# Patient Record
Sex: Female | Born: 1945 | Race: White | Hispanic: No | Marital: Married | Smoking: Never smoker
Health system: Southern US, Community
[De-identification: ages and names within clinical notes are randomized; demographics above are authoritative.]

## PROBLEM LIST (undated history)

## (undated) DIAGNOSIS — I10 Essential (primary) hypertension: Secondary | ICD-10-CM

## (undated) DIAGNOSIS — J45909 Unspecified asthma, uncomplicated: Secondary | ICD-10-CM

## (undated) DIAGNOSIS — B029 Zoster without complications: Secondary | ICD-10-CM

## (undated) DIAGNOSIS — K529 Noninfective gastroenteritis and colitis, unspecified: Secondary | ICD-10-CM

## (undated) DIAGNOSIS — Z87442 Personal history of urinary calculi: Secondary | ICD-10-CM

## (undated) DIAGNOSIS — K56609 Unspecified intestinal obstruction, unspecified as to partial versus complete obstruction: Secondary | ICD-10-CM

## (undated) DIAGNOSIS — M549 Dorsalgia, unspecified: Secondary | ICD-10-CM

## (undated) DIAGNOSIS — I447 Left bundle-branch block, unspecified: Secondary | ICD-10-CM

## (undated) DIAGNOSIS — A0472 Enterocolitis due to Clostridium difficile, not specified as recurrent: Secondary | ICD-10-CM

## (undated) DIAGNOSIS — J101 Influenza due to other identified influenza virus with other respiratory manifestations: Secondary | ICD-10-CM

## (undated) DIAGNOSIS — I639 Cerebral infarction, unspecified: Secondary | ICD-10-CM

## (undated) DIAGNOSIS — K219 Gastro-esophageal reflux disease without esophagitis: Secondary | ICD-10-CM

## (undated) DIAGNOSIS — M1990 Unspecified osteoarthritis, unspecified site: Secondary | ICD-10-CM

## (undated) DIAGNOSIS — I341 Nonrheumatic mitral (valve) prolapse: Secondary | ICD-10-CM

## (undated) HISTORY — PX: ABDOMINAL HYSTERECTOMY: SHX81

## (undated) HISTORY — PX: JOINT REPLACEMENT: SHX530

## (undated) HISTORY — PX: TONSILLECTOMY: SUR1361

## (undated) HISTORY — PX: CHOLECYSTECTOMY: SHX55

## (undated) HISTORY — PX: APPENDECTOMY: SHX54

## (undated) HISTORY — PX: CATARACT EXTRACTION: SUR2

## (undated) HISTORY — PX: OTHER SURGICAL HISTORY: SHX169

## (undated) HISTORY — PX: EXPLORATORY LAPAROTOMY: SUR591

## (undated) HISTORY — DX: Cerebral infarction, unspecified: I63.9

---

## 2008-03-16 HISTORY — PX: COLONOSCOPY: SHX174

## 2009-03-16 DIAGNOSIS — A0472 Enterocolitis due to Clostridium difficile, not specified as recurrent: Secondary | ICD-10-CM

## 2009-03-16 HISTORY — DX: Enterocolitis due to Clostridium difficile, not specified as recurrent: A04.72

## 2009-03-16 HISTORY — PX: ESOPHAGOGASTRODUODENOSCOPY: SHX1529

## 2009-12-07 ENCOUNTER — Inpatient Hospital Stay (HOSPITAL_COMMUNITY)
Admission: EM | Admit: 2009-12-07 | Discharge: 2009-12-15 | Payer: Self-pay | Source: Home / Self Care | Admitting: Emergency Medicine

## 2009-12-09 ENCOUNTER — Ambulatory Visit: Payer: Self-pay | Admitting: Internal Medicine

## 2009-12-11 ENCOUNTER — Ambulatory Visit: Payer: Self-pay | Admitting: Internal Medicine

## 2009-12-14 ENCOUNTER — Ambulatory Visit: Payer: Self-pay | Admitting: Internal Medicine

## 2010-01-07 ENCOUNTER — Ambulatory Visit: Payer: Self-pay | Admitting: Internal Medicine

## 2010-01-07 ENCOUNTER — Encounter: Payer: Self-pay | Admitting: Urgent Care

## 2010-01-07 DIAGNOSIS — R197 Diarrhea, unspecified: Secondary | ICD-10-CM | POA: Insufficient documentation

## 2010-01-07 DIAGNOSIS — Z8719 Personal history of other diseases of the digestive system: Secondary | ICD-10-CM

## 2010-01-07 DIAGNOSIS — R109 Unspecified abdominal pain: Secondary | ICD-10-CM

## 2010-01-07 DIAGNOSIS — D649 Anemia, unspecified: Secondary | ICD-10-CM

## 2010-01-09 LAB — CONVERTED CEMR LAB
Albumin: 4.4 g/dL (ref 3.5–5.2)
Alkaline Phosphatase: 65 units/L (ref 39–117)
CO2: 25 meq/L (ref 19–32)
Chloride: 105 meq/L (ref 96–112)
Eosinophils Absolute: 0.4 10*3/uL (ref 0.0–0.7)
Glucose, Bld: 91 mg/dL (ref 70–99)
Lymphocytes Relative: 39 % (ref 12–46)
Lymphs Abs: 2 10*3/uL (ref 0.7–4.0)
Neutro Abs: 2.3 10*3/uL (ref 1.7–7.7)
Neutrophils Relative %: 44 % (ref 43–77)
Platelets: 387 10*3/uL (ref 150–400)
Potassium: 4.9 meq/L (ref 3.5–5.3)
Sodium: 139 meq/L (ref 135–145)
Total Protein: 6.7 g/dL (ref 6.0–8.3)
WBC: 5.1 10*3/uL (ref 4.0–10.5)

## 2010-02-04 ENCOUNTER — Ambulatory Visit: Payer: Self-pay | Admitting: Internal Medicine

## 2010-02-04 ENCOUNTER — Encounter (INDEPENDENT_AMBULATORY_CARE_PROVIDER_SITE_OTHER): Payer: Self-pay | Admitting: *Deleted

## 2010-02-11 DIAGNOSIS — K219 Gastro-esophageal reflux disease without esophagitis: Secondary | ICD-10-CM | POA: Insufficient documentation

## 2010-02-11 DIAGNOSIS — R1319 Other dysphagia: Secondary | ICD-10-CM

## 2010-02-24 ENCOUNTER — Ambulatory Visit (HOSPITAL_COMMUNITY)
Admission: RE | Admit: 2010-02-24 | Discharge: 2010-02-24 | Payer: Self-pay | Source: Home / Self Care | Attending: Internal Medicine | Admitting: Internal Medicine

## 2010-03-06 ENCOUNTER — Encounter: Payer: Self-pay | Admitting: Internal Medicine

## 2010-04-15 NOTE — Assessment & Plan Note (Signed)
Summary: F/U PAIN IN HER STOMACH/HX OF C-DIFF/LAW   Visit Type:  Follow-up Visit Primary Care Provider:  michael caplan  Chief Complaint:  follow up- still having abd pain.  History of Present Illness: 65 year old lady recently hospitalized  with C. difficile infection. Now much better on probiotic therapy. She returns for followup ;she did have some nagging periumbilical pain predating her hospitalization .she continues to have symptoms but they have settled down  considerably. In reviewing her GI system, she notes long-standing GERD and esophageal dysphagia to solid food.  In  fact, she describes frequent intermittent food impactions. She describes undergoing an EGD in [place], [place]  some 20 years ago without significant findings. She did see Dr. Kingsley Plan hour in [place] a few years back.  NO EGD at that time or UGI series.. However, she was sent for, what sounds like an esophageal manometry,  and the catheter could not be placed.  This lady is intolerant to basically all the proton pump inhibitors and has been on ranitidine on a p.r.n. basis. She was noted to have an elevated eosinophil count in the 8 range recently. She tells me that her eosinophil count has been elevated most of her life which is very interesting. She denies melena hematochezia constipation diarrhea at this time. No family history of colon polyps or colon cancer. She had a negative colonoscopy 1 years ag by Dr Samuella Cota.  With the  information I have at hand at this time,  she have another colonoscopy in 9 years.  Preventive Screening-Counseling & Management  Alcohol-Tobacco     Smoking Status: never  Current Problems (verified): 1)  Anemia  (ICD-285.9) 2)  Abdominal Pain  (ICD-789.00) 3)  Diarrhea  (ICD-787.91) 4)  Clostridium Difficile Colitis, Hx of  (ICD-V12.79)  Current Medications (verified): 1)  Ranitidine Hcl 150 Mg Caps (Ranitidine Hcl) .... Take 1 Tablet By Mouth Two Times A Day 2)  Nitrostat 0.4 Mg Subl  (Nitroglycerin) .... As Needed 3)  Zyrtec Hives Relief 10 Mg Tabs (Cetirizine Hcl) .... Take 1 Tablet By Mouth Once A Day 4)  Centrum Silver .... Take 1 Tablet By Mouth Once A Day 5)  Nasonex 50 Mcg/act Susp (Mometasone Furoate) .... As Directed 6)  Singulair 10 Mg Tabs (Montelukast Sodium) .... Take 1 Tablet By Mouth Once A Day 7)  Epi Pen .... As Needed 8)  Cozaar 100 Mg Tabs (Losartan Potassium) .... Take 1 Tablet By Mouth Once A Day 9)  Aldactone 25 Mg Tabs (Spironolactone) .... Take 1 Tablet By Mouth Once A Day 10)  Lipitor 10 Mg Tabs (Atorvastatin Calcium) .... Take 1 Tablet By Mouth Once A Day 11)  Calan Sr 240 Mg Cr-Tabs (Verapamil Hcl) .... Take 1 Tablet By Mouth Once A Day 12)  Allergy Injections .... Once Every 3 Weeks 13)  Florastor 250 Mg Caps (Saccharomyces Boulardii) .Marland Kitchen.. 1 By Mouth Two Times A Day  Allergies (verified): 1)  ! Prilosec 2)  ! Nexium 3)  ! * Aciphex 4)  ! * Protonix 5)  ! Sulfa  Past History:  Past Medical History: mvp htn  high cholesterol allergies  Past Surgical History: hyst gallbladder appendix vericose veins tonsils tubal ligation breast lumps mole on L arm  Family History: Father: deceased- heart problems, ? pancreatic cancer Mother: deceased- breast cancer Siblings: 1 brother deceased No FH of Colon Cancer:  Social History: Marital Status: Married Children: 3 Occupation: semi retired Patient has never smoked.  Alcohol Use - no Smoking Status:  never  Vital Signs:  Patient profile:   65 year old female Height:      66.5 inches Weight:      160 pounds BMI:     25.53 Temp:     98.0 degrees F oral Pulse rate:   80 / minute BP sitting:   138 / 84  (left arm) Cuff size:   regular  Vitals Entered By: Hendricks Limes LPN (February 04, 2010 2:51 PM)  Physical Exam  General:  very pleasant well groomed lady in no acute distress Eyes:  no scleral icterus Abdomen:  nondistended positive bowel sounds she does have some  epigastric/periumbilical tenderness to palpation no appreciable mass or organomegaly  Impression & Recommendations:  Problem # 1:  ANEMIA (ICD-285.9) Impression:very pleasant 65 year old lady with a history of Clostridium difficile-related colitis and diarrhea. Symptoms now resolved.  She does have a history of elevated eosinophil count currently has an elevated eosinophil count. She describes long-standing GERD and esophageal dysphagia with transient food impactions. The symptoms need further investigation.  Recommendations:EGD with dilation plus minus biopsy et Karie Soda as appropriate in the near future risks, benefits, limitations imponderables alternatives have been discussed. Questions were answered all parties  agreeable. We'll make further recommendations at the very near future.  Appended Document: Orders Update    Clinical Lists Changes  Problems: Added new problem of OTHER DYSPHAGIA (ICD-787.29) Added new problem of GERD (ICD-530.81) Orders: Added new Service order of Est. Patient Level IV ([address]109) - Signed

## 2010-04-15 NOTE — Assessment & Plan Note (Signed)
Summary: hos fu in 3 weeks per RMR/ss   Visit Type:  Follow-up Visit Primary Care Provider:  Lavell Luster  Chief Complaint:  F/U hospital.  History of Present Illness: c/o abd pain around umbilicus, tender to touch x 10 days ago.  Worse w/ diarrhea 5-8 times after eating certain meals.  Denies mucous or blood in stool.  On average 2-3 BMs/day.  Overall feels well, except fatigue.  Hx constipation previously.  Rare nausea. Denies vomiting.  Denies fever.  Wt loss steady 20# IN 65YR w/ weight watchers.  Last colonoscopy last yr Dr Madelin Headings in [place], pt questions whether she needs repeat colonoscopy by Dr Jena Gauss?  Current Problems (verified): 1)  Abdominal Pain  (ICD-789.00) 2)  Diarrhea  (ICD-787.91) 3)  Clostridium Difficile Colitis, Hx of  (ICD-V12.79)  Current Medications (verified): 1)  Ranitidine Hcl 150 Mg Caps (Ranitidine Hcl) .... Take 1 Tablet By Mouth Two Times A Day 2)  Nitrostat 0.4 Mg Subl (Nitroglycerin) .... As Needed 3)  Zyrtec Hives Relief 10 Mg Tabs (Cetirizine Hcl) .... Take 1 Tablet By Mouth Once A Day 4)  Centrum Silver .... Take 1 Tablet By Mouth Once A Day 5)  Nasonex 50 Mcg/act Susp (Mometasone Furoate) .... As Directed 6)  Singulair 10 Mg Tabs (Montelukast Sodium) .... Take 1 Tablet By Mouth Once A Day 7)  Epi Pen .... As Needed 8)  Cozaar 100 Mg Tabs (Losartan Potassium) .... Take 1 Tablet By Mouth Once A Day 9)  Aldactone 25 Mg Tabs (Spironolactone) .... Take 1 Tablet By Mouth Once A Day 10)  Lipitor 10 Mg Tabs (Atorvastatin Calcium) .... Take 1 Tablet By Mouth Once A Day 11)  Calan Sr 240 Mg Cr-Tabs (Verapamil Hcl) .... Take 1 Tablet By Mouth Once A Day 12)  Allergy Injections .... Once Every 3 Weeks 13)  Florastor 250 Mg Caps (Saccharomyces Boulardii) .Marland Kitchen.. 1 By Mouth Two Times A Day  Allergies (verified): 1)  ! Prilosec 2)  ! Nexium 3)  ! * Aciphex 4)  ! * Protonix 5)  ! Sulfa  Review of Systems General:  Complains of weakness and malaise; denies fever,  chills, sweats, anorexia, fatigue, weight loss, and sleep disorder. Derm:  Denies rash, itching, dry skin, hives, moles, warts, and unhealing ulcers. Psych:  Denies depression, anxiety, memory loss, suicidal ideation, hallucinations, paranoia, phobia, and confusion. Heme:  Denies bruising, bleeding, and enlarged lymph nodes.  Vital Signs:  Patient profile:   65 year old female Height:      65.5 inches Weight:      169 pounds BMI:     27.80 Temp:     97.8 degrees F oral Pulse rate:   76 / minute BP sitting:   140 / 72  (right arm)  Physical Exam  General:  Well developed, well nourished, no acute distress. Head:  Normocephalic and atraumatic. Eyes:  Sclera clear, no icterus. Ears:  Normal auditory acuity. Nose:  No deformity, discharge,  or lesions. Mouth:  No deformity or lesions, dentition normal. Neck:  Supple; no masses or thyromegaly. Heart:  Regular rate and rhythm; no murmurs, rubs,  or bruits. Abdomen:  Soft, mild umbilical tenderness and nondistended. No masses, hepatosplenomegaly or hernias noted. Normal bowel sounds.without guarding and without rebound.   Msk:  Symmetrical with no gross deformities. Normal posture. Pulses:  Normal pulses noted. Extremities:  No clubbing, cyanosis, edema or deformities noted. Neurologic:  Alert and  oriented x4;  grossly normal neurologically. Skin:  Intact without significant  lesions or rashes. Cervical Nodes:  No significant cervical adenopathy. Psych:  Alert and cooperative. Normal mood and affect.  Impression & Recommendations:  Problem # 1:  CLOSTRIDIUM DIFFICILE COLITIS, HX OF (ICD-V12.79) 65 y/o caucasian female recently hospitalized w/ c diff colitis after abx use.  Completed course of vancomycin.  Doing well except 2 bouts post-prandial diarrhea and fatigue.  ?post-infectious IBS.  Discussed w/ Dr Jena Gauss, will continue to monitor.  Will not pursue repeat colonoscopy at this time.  Remain on probiotics. Orders: T-CBC w/Diff  ([id number]) T-Comprehensive Metabolic Panel ([id number]) Est. Patient Level III (29562)  Problem # 2:  ABDOMINAL PAIN (ICD-789.00) Mild tenderness around umbilicus, suspect gradual improvement over time sec #1  Problem # 3:  DIARRHEA (ICD-787.91) See #1  Problem # 4:  ANEMIA (ICD-285.9) While hospitalized.  FU CBC.  Patient Instructions: 1)  Continue Florastor two times a day Please fax records to Dr Lavell Luster [phone] Phone#[phone]  Appended Document: hos fu in 3 weeks per RMR/ss FU RMR 6-8 weeks  Appended Document: hos fu in 3 weeks per RMR/ss left message with pt's husband to have pt call me back to set up OV for 6-8 weeks w/RMR. I was told pt was napping and she would call me back.

## 2010-04-15 NOTE — Miscellaneous (Signed)
Summary: CONSULTATION  Clinical Lists Changes  NAME:  Rhonda Murphy, Rhonda Murphy               ACCOUNT NO.:  [id number]      MEDICAL RECORD NO.:  [id number]          PATIENT TYPE:  INP      LOCATION:  A303                          FACILITY:  [facility]      PHYSICIAN:  Lorenza Burton, N.P.    DATE OF BIRTH:  January 15, 1946      DATE OF CONSULTATION:  12/09/2009   DATE OF DISCHARGE:                                    CONSULTATION         PRIMARY CARE PROVIDER:  Primary care provider is in [place], [place].      CHIEF COMPLAINT:  Nausea, vomiting, and diarrhea.      HISTORY OF PRESENT ILLNESS:  Rhonda Murphy is a 65 year old female who is   very pleasant and who actually presented with severe diarrhea, up to 10-   12 stools per day that were watery.  She had actually finished about a   10-day course of antibiotics about a month ago due to an upper   respiratory infection.  She states that she finished up the antibiotics   on a Sunday and then she proceeded to have diffuse diarrhea starting on   that Thursday.  She then came to the hospital for treatment.  Upon   speaking with the patient, she complains of abdominal pain in the right   lower quadrant.  She is tender to palpation.  She does complain of   nausea.  She has had little to no appetite due to this nausea.   Yesterday, she had 12 watery stools; she has had 3 since 5:30 this   morning.  She denies any history of C. difficile, colon cancer, liver   problems, or digestive problems.  Her last colonoscopy was actually done   last year.      PAST MEDICAL HISTORY:   1. Chronic back pain.   2. History of gastric ulcer.   3. History of mitral valve prolapse.   4. Allergic rhinitis.   5. History of prior angioedema.  She has an EpiPen.   6. Hypertension.   7. Hyperlipidemia.      PAST SURGICAL HISTORY:   1. Tonsillectomy.   2. Right breast lumpectomy.   3. Mole removal.   4. Tubal ligation.   5. Partial hysterectomy.   6. Cholecystectomy.   7.  Appendectomy.   8. She has also had some type of a calcium deposit removed from her       left breast.      ALLERGIES:  ALL PROTON PUMP INHIBITORS AND SULFA.      MEDICATIONS:  Current medications for this admission are:   1. Lovenox 40 mg subcutaneously daily.   2. Pepcid 20 mg p.o. twice a day.   3. Flagyl 500 mg IV every 8 hours.   4. Zofran 4 mg IV every 6 hours.   5. Florastor 500 mg p.o. twice daily.   6. Pepto-Bismol 30 mL p.o. every 4 hours as needed.   7. Morphine 2-4 mg IV every 3 hours as needed  for pain.   8. Phenergan 6.25-12.5 mg IV every 4 hours as needed for nausea.   9. Ambien 5 mg p.o. each evening.      FAMILY/SOCIAL HISTORY:  She is married and lives with her husband in   [place], [place].  She denies any tobacco, alcohol, or IV drug abuse.   She denies any family history of colon cancer, liver problems, or   digestive problems.      REVIEW OF SYSTEMS:  Review of systems is negative except as mentioned in   the history of present illness.      PHYSICAL EXAMINATION:  VITAL SIGNS:  BP 116/73, pulse 104, respirations   18, temperature 98.8.  She is 96% on room air.   GENERAL:  She is in no apparent distress.   HEENT:  Sclerae are nonicteric.   RESPIRATORY:  Clear to auscultation bilaterally.   CARDIAC:  Regular rate and rhythm.   ABDOMEN:  Soft.  It is tender in the mid-abdominal area as well as the   right lower quadrant.  It is nondistended.  She has positive bowel   sounds.   EXTREMITIES:  Without edema.      PERTINENT LABS:  As of this morning, white blood cell count 25.6,   hemoglobin 11.6, hematocrit 34.2.  Sodium 133, potassium 4.0, chloride   107, CO2 of 21, BUN 13, creatinine 1.24, calcium 7.8.  C. difficile   stool samples are pending.  CT scan of the abdomen showed diffuse   colitis involving the entirety of the colon and cecum with significant   wall thickening and soft tissue formation, most likely reflecting an   infectious process such as C.  difficile, less likely or inflammatory   process.      ASSESSMENT:  Rhonda Murphy is a 65 year old female who has presented with   abdominal pain and diarrhea after taking antibiotics for about a 10-day   course.  The likely diagnosis is Clostridium difficile.  We are awaiting   the cultures.      PLAN:   1. Continue Flagyl IV and convert to p.o. when she is able to tolerate       p.o.   2. Discontinue the aspirin as she is continuing to have nausea.   3. As she is allergic to PPI, we will start her on Pepcid 20 mg twice       a day for GI prophylaxis.   4. Add Florastor twice a day.   5. Kaopectate as needed.   6. Advance to a full diet when she is able to tolerate.   7. We will need the report from her last colonoscopy which was       approximately a year ago in [place]. by Dr Madelin Headings.      We would like to thank Dr. Mikeal Hawthorne for this consult.               Lorenza Burton, N.P.               KJ/MEDQ  D:  12/09/2009  T:  12/09/2009  Job:  [id number]      cc:   Lonia Blood, M.D.      Dr. Filomena Jungling   [place]   [place]      Electronically Signed by Lorenza Burton N.P. on 12/10/2009 11:40:40 AM   Electronically Signed by Lorrin Goodell M.D. on 01/20/2010 11:17:03 AM

## 2010-04-17 NOTE — Letter (Signed)
Summary: Patient Notice, Endo Biopsy Results  [facility]  [address]   [place], [place] [address]   Phone: [phone]  Fax: [phone]       March 06, 2010   Rhonda Murphy [address] [place], [place]  [address] 11/02/1945    Dear Ms. Robinette,  I am pleased to inform you that the biopsies taken during your recent endoscopic examination did not show any abnormality upon pathologic examination.  Additional information/recommendations:  Continue with the treatment plan as outlined on the day of your exam.  Please call us if you are having persistent problems or have questions about your condition that have not been fully answered at this time.  Sincerely,    R. Roetta Sessions MD, FACP [facility] Ph: [phone]   Fax: [phone]   Appended Document: Patient Notice, Endo Biopsy Results letter mailed to pt

## 2010-05-29 LAB — URINALYSIS, ROUTINE W REFLEX MICROSCOPIC
Nitrite: NEGATIVE
Specific Gravity, Urine: >1.03 — ABNORMAL HIGH (ref 1.005–1.030)
pH: 5.5 (ref 5.0–8.0)

## 2010-05-29 LAB — GLUCOSE, CAPILLARY
Glucose-Capillary: 114 mg/dL — ABNORMAL HIGH (ref 70–99)
Glucose-Capillary: 252 mg/dL — ABNORMAL HIGH (ref 70–99)
Glucose-Capillary: 269 mg/dL — ABNORMAL HIGH (ref 70–99)

## 2010-05-29 LAB — CBC
HCT: 29.7 % — ABNORMAL LOW (ref 36.0–46.0)
HCT: 30.6 % — ABNORMAL LOW (ref 36.0–46.0)
HCT: 32.4 % — ABNORMAL LOW (ref 36.0–46.0)
HCT: 34.2 % — ABNORMAL LOW (ref 36.0–46.0)
Hemoglobin: 9.9 g/dL — ABNORMAL LOW (ref 12.0–15.0)
Hemoglobin: 10.2 g/dL — ABNORMAL LOW (ref 12.0–15.0)
Hemoglobin: 10.9 g/dL — ABNORMAL LOW (ref 12.0–15.0)
Hemoglobin: 11.3 g/dL — ABNORMAL LOW (ref 12.0–15.0)
Hemoglobin: 11.3 g/dL — ABNORMAL LOW (ref 12.0–15.0)
Hemoglobin: 11.6 g/dL — ABNORMAL LOW (ref 12.0–15.0)
MCH: 30.6 pg (ref 26.0–34.0)
MCH: 30.4 pg (ref 26.0–34.0)
MCH: 30.6 pg (ref 26.0–34.0)
MCH: 30.6 pg (ref 26.0–34.0)
MCH: 30.8 pg (ref 26.0–34.0)
MCH: 30.9 pg (ref 26.0–34.0)
MCH: 30.9 pg (ref 26.0–34.0)
MCHC: 33.3 g/dL (ref 30.0–36.0)
MCHC: 33.4 g/dL (ref 30.0–36.0)
MCHC: 33.7 g/dL (ref 30.0–36.0)
MCHC: 33.7 g/dL (ref 30.0–36.0)
MCHC: 33.8 g/dL (ref 30.0–36.0)
MCHC: 33.9 g/dL (ref 30.0–36.0)
MCHC: 34 g/dL (ref 30.0–36.0)
MCV: 90.6 fL (ref 78.0–100.0)
MCV: 90.9 fL (ref 78.0–100.0)
MCV: 91.1 fL (ref 78.0–100.0)
MCV: 91.6 fL (ref 78.0–100.0)
Platelets: 331 10*3/uL (ref 150–400)
Platelets: 345 10*3/uL (ref 150–400)
Platelets: 367 10*3/uL (ref 150–400)
Platelets: 403 10*3/uL — ABNORMAL HIGH (ref 150–400)
Platelets: 416 K/uL — ABNORMAL HIGH (ref 150–400)
RBC: 3.36 MIL/uL — ABNORMAL LOW (ref 3.87–5.11)
RBC: 3.52 MIL/uL — ABNORMAL LOW (ref 3.87–5.11)
RDW: 12.1 % (ref 11.5–15.5)
RDW: 12.4 % (ref 11.5–15.5)
RDW: 12.5 % (ref 11.5–15.5)
WBC: 12.9 K/uL — ABNORMAL HIGH (ref 4.0–10.5)
WBC: 21.8 10*3/uL — ABNORMAL HIGH (ref 4.0–10.5)

## 2010-05-29 LAB — BASIC METABOLIC PANEL
CO2: 27 mEq/L (ref 19–32)
CO2: 18 mEq/L — ABNORMAL LOW (ref 19–32)
CO2: 21 mEq/L (ref 19–32)
CO2: 21 mEq/L (ref 19–32)
CO2: 23 mEq/L (ref 19–32)
Calcium: 7.4 mg/dL — ABNORMAL LOW (ref 8.4–10.5)
Calcium: 7.4 mg/dL — ABNORMAL LOW (ref 8.4–10.5)
Calcium: 7.6 mg/dL — ABNORMAL LOW (ref 8.4–10.5)
Calcium: 7.7 mg/dL — ABNORMAL LOW (ref 8.4–10.5)
Chloride: 106 mEq/L (ref 96–112)
Chloride: 108 mEq/L (ref 96–112)
Creatinine, Ser: 0.94 mg/dL (ref 0.4–1.2)
Creatinine, Ser: 0.95 mg/dL (ref 0.4–1.2)
GFR calc non Af Amer: 59 mL/min — ABNORMAL LOW (ref >60)
Glucose, Bld: 101 mg/dL — ABNORMAL HIGH (ref 70–99)
Glucose, Bld: 106 mg/dL — ABNORMAL HIGH (ref 70–99)
Glucose, Bld: 109 mg/dL — ABNORMAL HIGH (ref 70–99)
Glucose, Bld: 111 mg/dL — ABNORMAL HIGH (ref 70–99)
Glucose, Bld: 118 mg/dL — ABNORMAL HIGH (ref 70–99)
Potassium: 4 mEq/L (ref 3.5–5.1)
Potassium: 4 mEq/L (ref 3.5–5.1)
Sodium: 135 mEq/L (ref 135–145)
Sodium: 132 mEq/L — ABNORMAL LOW (ref 135–145)
Sodium: 133 mEq/L — ABNORMAL LOW (ref 135–145)
Sodium: 135 mEq/L (ref 135–145)

## 2010-05-29 LAB — DIFFERENTIAL
Basophils Absolute: 0 10*3/uL (ref 0.0–0.1)
Basophils Absolute: 0.1 10*3/uL (ref 0.0–0.1)
Basophils Absolute: 0.1 K/uL (ref 0.0–0.1)
Basophils Relative: 1 % (ref 0–1)
Basophils Relative: 0 % (ref 0–1)
Basophils Relative: 0 % (ref 0–1)
Basophils Relative: 0 % (ref 0–1)
Basophils Relative: 0 % (ref 0–1)
Basophils Relative: 1 % (ref 0–1)
Eosinophils Absolute: 0.6 10*3/uL (ref 0.0–0.7)
Eosinophils Absolute: 0 10*3/uL (ref 0.0–0.7)
Eosinophils Absolute: 0.1 10*3/uL (ref 0.0–0.7)
Eosinophils Absolute: 0.1 10*3/uL (ref 0.0–0.7)
Eosinophils Absolute: 0.3 K/uL (ref 0.0–0.7)
Eosinophils Relative: 6 % — ABNORMAL HIGH (ref 0–5)
Eosinophils Relative: 0 % (ref 0–5)
Eosinophils Relative: 0 % (ref 0–5)
Eosinophils Relative: 1 % (ref 0–5)
Eosinophils Relative: 1 % (ref 0–5)
Eosinophils Relative: 2 % (ref 0–5)
Eosinophils Relative: 2 % (ref 0–5)
Lymphocytes Relative: 11 % — ABNORMAL LOW (ref 12–46)
Lymphocytes Relative: 13 % (ref 12–46)
Lymphocytes Relative: 5 % — ABNORMAL LOW (ref 12–46)
Lymphs Abs: 1.2 10*3/uL (ref 0.7–4.0)
Lymphs Abs: 1.3 10*3/uL (ref 0.7–4.0)
Lymphs Abs: 1.4 10*3/uL (ref 0.7–4.0)
Lymphs Abs: 2.1 K/uL (ref 0.7–4.0)
Lymphs Abs: 2.2 10*3/uL (ref 0.7–4.0)
Monocytes Absolute: 0.8 10*3/uL (ref 0.1–1.0)
Monocytes Absolute: 1.1 10*3/uL — ABNORMAL HIGH (ref 0.1–1.0)
Monocytes Absolute: 1.1 K/uL — ABNORMAL HIGH (ref 0.1–1.0)
Monocytes Absolute: 1.4 10*3/uL — ABNORMAL HIGH (ref 0.1–1.0)
Monocytes Absolute: 1.4 10*3/uL — ABNORMAL HIGH (ref 0.1–1.0)
Monocytes Absolute: 1.5 10*3/uL — ABNORMAL HIGH (ref 0.1–1.0)
Monocytes Relative: 8 % (ref 3–12)
Monocytes Relative: 6 % (ref 3–12)
Monocytes Relative: 6 % (ref 3–12)
Monocytes Relative: 6 % (ref 3–12)
Monocytes Relative: 6 % (ref 3–12)
Monocytes Relative: 7 % (ref 3–12)
Neutro Abs: 10.5 10*3/uL — ABNORMAL HIGH (ref 1.7–7.7)
Neutro Abs: 13.2 K/uL — ABNORMAL HIGH (ref 1.7–7.7)
Neutro Abs: 21 10*3/uL — ABNORMAL HIGH (ref 1.7–7.7)
Neutro Abs: 22.2 10*3/uL — ABNORMAL HIGH (ref 1.7–7.7)
Neutrophils Relative %: 78 % — ABNORMAL HIGH (ref 43–77)
Neutrophils Relative %: 80 % — ABNORMAL HIGH (ref 43–77)
Neutrophils Relative %: 81 % — ABNORMAL HIGH (ref 43–77)
Neutrophils Relative %: 87 % — ABNORMAL HIGH (ref 43–77)

## 2010-05-29 LAB — CULTURE, BLOOD (ROUTINE X 2)
Culture: NO GROWTH
Culture: NO GROWTH

## 2010-05-29 LAB — BRAIN NATRIURETIC PEPTIDE: Pro B Natriuretic peptide (BNP): <30 pg/mL (ref 0.0–100.0)

## 2010-05-29 LAB — OVA AND PARASITE EXAMINATION

## 2010-05-29 LAB — COMPREHENSIVE METABOLIC PANEL
ALT: 16 U/L (ref 0–35)
AST: 22 U/L (ref 0–37)
AST: 25 U/L (ref 0–37)
Albumin: 2.5 g/dL — ABNORMAL LOW (ref 3.5–5.2)
Albumin: 2.9 g/dL — ABNORMAL LOW (ref 3.5–5.2)
Alkaline Phosphatase: 64 U/L (ref 39–117)
Calcium: 8.3 mg/dL — ABNORMAL LOW (ref 8.4–10.5)
Chloride: 103 mEq/L (ref 96–112)
Creatinine, Ser: 1.9 mg/dL — ABNORMAL HIGH (ref 0.4–1.2)
GFR calc Af Amer: 32 mL/min — ABNORMAL LOW (ref >60)
GFR calc Af Amer: 32 mL/min — ABNORMAL LOW (ref >60)
GFR calc non Af Amer: 26 mL/min — ABNORMAL LOW (ref >60)
Potassium: 4.2 mEq/L (ref 3.5–5.1)
Sodium: 131 mEq/L — ABNORMAL LOW (ref 135–145)
Total Bilirubin: 0.1 mg/dL — ABNORMAL LOW (ref 0.3–1.2)
Total Protein: 4.9 g/dL — ABNORMAL LOW (ref 6.0–8.3)

## 2010-05-29 LAB — CLOSTRIDIUM DIFFICILE EIA
C difficile Toxins A+B, EIA: NEGATIVE
C difficile Toxins A+B, EIA: NEGATIVE

## 2010-05-29 LAB — FECAL LACTOFERRIN, QUANT: Fecal Lactoferrin: POSITIVE

## 2010-05-29 LAB — CARDIAC PANEL(CRET KIN+CKTOT+MB+TROPI): Total CK: 28 U/L (ref 7–177)

## 2010-05-29 LAB — CLOSTRIDIUM DIFFICILE BY PCR: Toxigenic C. Difficile by PCR: DETECTED — AB

## 2010-05-29 LAB — TSH: TSH: 7.336 u[IU]/mL — ABNORMAL HIGH (ref 0.350–4.500)

## 2010-05-29 LAB — PHOSPHORUS: Phosphorus: 4.1 mg/dL (ref 2.3–4.6)

## 2010-05-29 LAB — LIPASE, BLOOD: Lipase: 21 U/L (ref 11–59)

## 2010-05-29 LAB — STOOL CULTURE

## 2013-03-21 ENCOUNTER — Inpatient Hospital Stay (HOSPITAL_COMMUNITY)
Admission: EM | Admit: 2013-03-21 | Discharge: 2013-03-26 | DRG: 392 | Disposition: A | Discharge status: Home / Self Care | Payer: Medicare Other | Attending: Internal Medicine | Admitting: Internal Medicine

## 2013-03-21 ENCOUNTER — Emergency Department (HOSPITAL_COMMUNITY): Payer: Medicare Other

## 2013-03-21 ENCOUNTER — Encounter (HOSPITAL_COMMUNITY): Payer: Self-pay | Admitting: Emergency Medicine

## 2013-03-21 DIAGNOSIS — I059 Rheumatic mitral valve disease, unspecified: Secondary | ICD-10-CM | POA: Diagnosis present

## 2013-03-21 DIAGNOSIS — R1110 Vomiting, unspecified: Secondary | ICD-10-CM

## 2013-03-21 DIAGNOSIS — K56609 Unspecified intestinal obstruction, unspecified as to partial versus complete obstruction: Secondary | ICD-10-CM

## 2013-03-21 DIAGNOSIS — D72819 Decreased white blood cell count, unspecified: Secondary | ICD-10-CM

## 2013-03-21 DIAGNOSIS — R1319 Other dysphagia: Secondary | ICD-10-CM

## 2013-03-21 DIAGNOSIS — E876 Hypokalemia: Secondary | ICD-10-CM | POA: Diagnosis present

## 2013-03-21 DIAGNOSIS — K219 Gastro-esophageal reflux disease without esophagitis: Secondary | ICD-10-CM

## 2013-03-21 DIAGNOSIS — Z90710 Acquired absence of both cervix and uterus: Secondary | ICD-10-CM

## 2013-03-21 DIAGNOSIS — I10 Essential (primary) hypertension: Secondary | ICD-10-CM | POA: Diagnosis present

## 2013-03-21 DIAGNOSIS — K529 Noninfective gastroenteritis and colitis, unspecified: Secondary | ICD-10-CM

## 2013-03-21 DIAGNOSIS — R509 Fever, unspecified: Secondary | ICD-10-CM

## 2013-03-21 DIAGNOSIS — D649 Anemia, unspecified: Secondary | ICD-10-CM

## 2013-03-21 DIAGNOSIS — J101 Influenza due to other identified influenza virus with other respiratory manifestations: Secondary | ICD-10-CM

## 2013-03-21 DIAGNOSIS — J111 Influenza due to unidentified influenza virus with other respiratory manifestations: Secondary | ICD-10-CM | POA: Diagnosis present

## 2013-03-21 DIAGNOSIS — K5289 Other specified noninfective gastroenteritis and colitis: Principal | ICD-10-CM

## 2013-03-21 DIAGNOSIS — J45909 Unspecified asthma, uncomplicated: Secondary | ICD-10-CM | POA: Diagnosis present

## 2013-03-21 DIAGNOSIS — E860 Dehydration: Secondary | ICD-10-CM | POA: Diagnosis present

## 2013-03-21 DIAGNOSIS — Z8719 Personal history of other diseases of the digestive system: Secondary | ICD-10-CM

## 2013-03-21 DIAGNOSIS — R079 Chest pain, unspecified: Secondary | ICD-10-CM

## 2013-03-21 DIAGNOSIS — R197 Diarrhea, unspecified: Secondary | ICD-10-CM

## 2013-03-21 DIAGNOSIS — I447 Left bundle-branch block, unspecified: Secondary | ICD-10-CM

## 2013-03-21 DIAGNOSIS — R109 Unspecified abdominal pain: Secondary | ICD-10-CM

## 2013-03-21 HISTORY — DX: Gastro-esophageal reflux disease without esophagitis: K21.9

## 2013-03-21 HISTORY — DX: Essential (primary) hypertension: I10

## 2013-03-21 HISTORY — DX: Noninfective gastroenteritis and colitis, unspecified: K52.9

## 2013-03-21 HISTORY — DX: Nonrheumatic mitral (valve) prolapse: I34.1

## 2013-03-21 HISTORY — DX: Dorsalgia, unspecified: M54.9

## 2013-03-21 HISTORY — DX: Enterocolitis due to Clostridium difficile, not specified as recurrent: A04.72

## 2013-03-21 HISTORY — DX: Left bundle-branch block, unspecified: I44.7

## 2013-03-21 HISTORY — DX: Unspecified intestinal obstruction, unspecified as to partial versus complete obstruction: K56.609

## 2013-03-21 HISTORY — DX: Zoster without complications: B02.9

## 2013-03-21 HISTORY — DX: Unspecified asthma, uncomplicated: J45.909

## 2013-03-21 HISTORY — DX: Influenza due to other identified influenza virus with other respiratory manifestations: J10.1

## 2013-03-21 LAB — CBC WITH DIFFERENTIAL/PLATELET
BASOS ABS: 0.1 10*3/uL (ref 0.0–0.1)
BASOS PCT: 1 % (ref 0–1)
Eosinophils Absolute: 0.1 10*3/uL (ref 0.0–0.7)
Eosinophils Relative: 1 % (ref 0–5)
HCT: 37 % (ref 36.0–46.0)
Hemoglobin: 12.3 g/dL (ref 12.0–15.0)
Lymphocytes Relative: 14 % (ref 12–46)
Lymphs Abs: 1 10*3/uL (ref 0.7–4.0)
MCH: 30.4 pg (ref 26.0–34.0)
MCHC: 33.2 g/dL (ref 30.0–36.0)
MCV: 91.6 fL (ref 78.0–100.0)
Monocytes Absolute: 0.4 10*3/uL (ref 0.1–1.0)
Monocytes Relative: 5 % (ref 3–12)
NEUTROS PCT: 79 % — AB (ref 43–77)
Neutro Abs: 6 10*3/uL (ref 1.7–7.7)
Platelets: 288 10*3/uL (ref 150–400)
RBC: 4.04 MIL/uL (ref 3.87–5.11)
RDW: 12.4 % (ref 11.5–15.5)
WBC: 7.6 10*3/uL (ref 4.0–10.5)

## 2013-03-21 LAB — URINALYSIS, ROUTINE W REFLEX MICROSCOPIC
BILIRUBIN URINE: NEGATIVE
Glucose, UA: NEGATIVE mg/dL
Hgb urine dipstick: NEGATIVE
KETONES UR: NEGATIVE mg/dL
Leukocytes, UA: NEGATIVE
Nitrite: NEGATIVE
PH: 7 (ref 5.0–8.0)
Protein, ur: NEGATIVE mg/dL
Specific Gravity, Urine: 1.015 (ref 1.005–1.030)
UROBILINOGEN UA: 0.2 mg/dL (ref 0.0–1.0)

## 2013-03-21 LAB — LIPASE, BLOOD: Lipase: 34 U/L (ref 11–59)

## 2013-03-21 LAB — BASIC METABOLIC PANEL
BUN: 23 mg/dL (ref 6–23)
CO2: 23 mEq/L (ref 19–32)
Calcium: 9.5 mg/dL (ref 8.4–10.5)
Chloride: 94 mEq/L — ABNORMAL LOW (ref 96–112)
Creatinine, Ser: 0.99 mg/dL (ref 0.50–1.10)
GFR, EST AFRICAN AMERICAN: 66 mL/min — AB (ref >90)
GFR, EST NON AFRICAN AMERICAN: 57 mL/min — AB (ref >90)
Glucose, Bld: 166 mg/dL — ABNORMAL HIGH (ref 70–99)
Potassium: 3.6 mEq/L — ABNORMAL LOW (ref 3.7–5.3)
Sodium: 134 mEq/L — ABNORMAL LOW (ref 137–147)

## 2013-03-21 LAB — TROPONIN I
Troponin I: <0.3 ng/mL (ref <0.30)
Troponin I: <0.3 ng/mL (ref <0.30)

## 2013-03-21 LAB — HEPATIC FUNCTION PANEL
ALT: 22 U/L (ref 0–35)
AST: 27 U/L (ref 0–37)
Albumin: 3.9 g/dL (ref 3.5–5.2)
Alkaline Phosphatase: 68 U/L (ref 39–117)
BILIRUBIN TOTAL: 0.3 mg/dL (ref 0.3–1.2)
Total Protein: 6.9 g/dL (ref 6.0–8.3)

## 2013-03-21 LAB — LACTIC ACID, PLASMA: Lactic Acid, Venous: 2 mmol/L (ref 0.5–2.2)

## 2013-03-21 MED ORDER — CIPROFLOXACIN IN D5W 400 MG/200ML IV SOLN
400.0000 mg | Freq: Once | INTRAVENOUS | Status: AC
  Administered 2013-03-21: 400 mg via INTRAVENOUS
  Filled 2013-03-21: qty 200

## 2013-03-21 MED ORDER — CIPROFLOXACIN IN D5W 400 MG/200ML IV SOLN
INTRAVENOUS | Status: AC
  Filled 2013-03-21: qty 200

## 2013-03-21 MED ORDER — LOSARTAN POTASSIUM 50 MG PO TABS
100.0000 mg | ORAL_TABLET | Freq: Every day | ORAL | Status: DC
  Administered 2013-03-22 – 2013-03-26 (×5): 100 mg via ORAL
  Filled 2013-03-21 (×5): qty 2

## 2013-03-21 MED ORDER — SODIUM CHLORIDE 0.9 % IV BOLUS (SEPSIS)
500.0000 mL | Freq: Once | INTRAVENOUS | Status: AC
  Administered 2013-03-21: 500 mL via INTRAVENOUS

## 2013-03-21 MED ORDER — ONDANSETRON HCL 4 MG PO TABS
4.0000 mg | ORAL_TABLET | Freq: Four times a day (QID) | ORAL | Status: DC | PRN
  Administered 2013-03-23: 4 mg via ORAL
  Filled 2013-03-21: qty 1

## 2013-03-21 MED ORDER — ACETAMINOPHEN 650 MG RE SUPP: 650.0000 mg | Freq: Four times a day (QID) | RECTAL | Status: DC | PRN

## 2013-03-21 MED ORDER — ENOXAPARIN SODIUM 40 MG/0.4ML ~~LOC~~ SOLN
40.0000 mg | SUBCUTANEOUS | Status: DC
  Administered 2013-03-21 – 2013-03-25 (×5): 40 mg via SUBCUTANEOUS
  Filled 2013-03-21 (×5): qty 0.4

## 2013-03-21 MED ORDER — POTASSIUM CHLORIDE IN NACL 20-0.45 MEQ/L-% IV SOLN
INTRAVENOUS | Status: DC
  Administered 2013-03-21 – 2013-03-24 (×4): via INTRAVENOUS
  Filled 2013-03-21 (×14): qty 1000

## 2013-03-21 MED ORDER — FENTANYL CITRATE 0.05 MG/ML IJ SOLN
50.0000 ug | Freq: Once | INTRAMUSCULAR | Status: AC
  Administered 2013-03-21: 50 ug via INTRAVENOUS
  Filled 2013-03-21: qty 2

## 2013-03-21 MED ORDER — MILK AND MOLASSES ENEMA
1.0000 | Freq: Once | RECTAL | Status: AC
  Administered 2013-03-21: 250 mL via RECTAL

## 2013-03-21 MED ORDER — ONDANSETRON HCL 4 MG/2ML IJ SOLN
4.0000 mg | Freq: Once | INTRAMUSCULAR | Status: AC
  Administered 2013-03-21: 4 mg via INTRAVENOUS
  Filled 2013-03-21: qty 2

## 2013-03-21 MED ORDER — CIPROFLOXACIN IN D5W 400 MG/200ML IV SOLN
400.0000 mg | Freq: Two times a day (BID) | INTRAVENOUS | Status: DC
  Administered 2013-03-22 – 2013-03-25 (×8): 400 mg via INTRAVENOUS
  Filled 2013-03-21 (×12): qty 200

## 2013-03-21 MED ORDER — AMITRIPTYLINE HCL 25 MG PO TABS
25.0000 mg | ORAL_TABLET | Freq: Every day | ORAL | Status: DC
  Administered 2013-03-21 – 2013-03-25 (×5): 25 mg via ORAL
  Filled 2013-03-21 (×5): qty 1

## 2013-03-21 MED ORDER — ONDANSETRON HCL 4 MG/2ML IJ SOLN
4.0000 mg | Freq: Three times a day (TID) | INTRAMUSCULAR | Status: DC | PRN
Start: 2013-03-21 — End: 2013-03-21

## 2013-03-21 MED ORDER — METRONIDAZOLE IN NACL 5-0.79 MG/ML-% IV SOLN
500.0000 mg | Freq: Once | INTRAVENOUS | Status: AC
  Administered 2013-03-21: 500 mg via INTRAVENOUS
  Filled 2013-03-21: qty 100

## 2013-03-21 MED ORDER — IOHEXOL 300 MG/ML  SOLN
100.0000 mL | Freq: Once | INTRAMUSCULAR | Status: AC | PRN
  Administered 2013-03-21: 100 mL via INTRAVENOUS

## 2013-03-21 MED ORDER — METRONIDAZOLE IN NACL 5-0.79 MG/ML-% IV SOLN
INTRAVENOUS | Status: AC
  Filled 2013-03-21: qty 200

## 2013-03-21 MED ORDER — METRONIDAZOLE IN NACL 5-0.79 MG/ML-% IV SOLN
500.0000 mg | Freq: Three times a day (TID) | INTRAVENOUS | Status: DC
  Administered 2013-03-22 – 2013-03-25 (×12): 500 mg via INTRAVENOUS
  Filled 2013-03-21 (×17): qty 100

## 2013-03-21 MED ORDER — ONDANSETRON HCL 4 MG/2ML IJ SOLN: 4.0000 mg | Freq: Three times a day (TID) | INTRAMUSCULAR | Status: DC | PRN

## 2013-03-21 MED ORDER — MORPHINE SULFATE 2 MG/ML IJ SOLN
1.0000 mg | INTRAMUSCULAR | Status: DC | PRN
  Administered 2013-03-21 – 2013-03-23 (×3): 1 mg via INTRAVENOUS
  Filled 2013-03-21 (×3): qty 1

## 2013-03-21 MED ORDER — SODIUM CHLORIDE 0.9 % IV SOLN: INTRAVENOUS | Status: DC

## 2013-03-21 MED ORDER — SIMVASTATIN 20 MG PO TABS
20.0000 mg | ORAL_TABLET | Freq: Every day | ORAL | Status: DC
  Administered 2013-03-21 – 2013-03-25 (×5): 20 mg via ORAL
  Filled 2013-03-21 (×5): qty 1

## 2013-03-21 MED ORDER — IOHEXOL 300 MG/ML  SOLN
50.0000 mL | Freq: Once | INTRAMUSCULAR | Status: AC | PRN
  Administered 2013-03-21: 50 mL via ORAL

## 2013-03-21 MED ORDER — ONDANSETRON HCL 4 MG/2ML IJ SOLN
4.0000 mg | Freq: Four times a day (QID) | INTRAMUSCULAR | Status: DC | PRN
  Administered 2013-03-21: 4 mg via INTRAVENOUS
  Filled 2013-03-21: qty 2

## 2013-03-21 MED ORDER — NEBIVOLOL HCL 10 MG PO TABS
20.0000 mg | ORAL_TABLET | Freq: Every day | ORAL | Status: DC
  Administered 2013-03-22 – 2013-03-26 (×5): 20 mg via ORAL
  Filled 2013-03-21 (×6): qty 2

## 2013-03-21 MED ORDER — SODIUM CHLORIDE 0.9 % IJ SOLN
3.0000 mL | Freq: Two times a day (BID) | INTRAMUSCULAR | Status: DC
  Administered 2013-03-21 – 2013-03-26 (×6): 3 mL via INTRAVENOUS

## 2013-03-21 MED ORDER — POTASSIUM CHLORIDE IN NACL 20-0.45 MEQ/L-% IV SOLN
INTRAVENOUS | Status: AC
  Filled 2013-03-21: qty 1000

## 2013-03-21 MED ORDER — FAMOTIDINE IN NACL 20-0.9 MG/50ML-% IV SOLN
20.0000 mg | Freq: Once | INTRAVENOUS | Status: AC
  Administered 2013-03-21: 20 mg via INTRAVENOUS
  Filled 2013-03-21: qty 50

## 2013-03-21 MED ORDER — ACETAMINOPHEN 325 MG PO TABS
650.0000 mg | ORAL_TABLET | Freq: Four times a day (QID) | ORAL | Status: DC | PRN
  Administered 2013-03-24 – 2013-03-26 (×4): 650 mg via ORAL
  Filled 2013-03-21 (×4): qty 2

## 2013-03-21 MED ORDER — FENTANYL CITRATE 0.05 MG/ML IJ SOLN
50.0000 ug | Freq: Once | INTRAMUSCULAR | Status: AC | PRN
  Administered 2013-03-21: 50 ug via INTRAVENOUS
  Filled 2013-03-21: qty 2

## 2013-03-21 MED ORDER — AMLODIPINE BESYLATE 5 MG PO TABS
5.0000 mg | ORAL_TABLET | Freq: Every day | ORAL | Status: DC
  Administered 2013-03-22 – 2013-03-26 (×5): 5 mg via ORAL
  Filled 2013-03-21 (×5): qty 1

## 2013-03-21 NOTE — H&P (Signed)
[facility] History and Physical  Rhonda Murphy ZOX:[id number] DOB: [birth year] DOA: 03/21/2013  Referring physician: Dr. Jodi Mourning PCP: Romeo Rabon, MD   Chief Complaint: vomiting  HPI: Rhonda Murphy is a 68 y.o. female who presents to the emergency room today with complaints of abdominal pain and vomiting. Patient was in her usual state of health when this morning she developed generalized abdominal pain. The patient attempts to move her bowels but wasn't successful. Her last bowel movement was yesterday and was normal. The patient reports that she does chronically deal with constipation. She subsequently developed multiple episodes of vomiting, which prompted her evaluation in the emergency room. She denies any fever, no diarrhea, no hematemesis, melena, hematochezia. She's not had any sick contacts. She reports recently being on antibiotics. Denies any chest pain, shortness of breath. In the emergency room she underwent CT scan which indicated enteritis versus possible bowel obstruction. The patient has been admitted for further treatment. Of note, she does have a significant past surgical history of hysterectomy, appendectomy cholecystectomy.   Review of Systems: pertinent positives as per HPI, otherwise negative  Past Medical History  Diagnosis Date  . Mitral valve prolapse   . Hypertension   . Acid reflux   . Asthma   . Shingles   . C. difficile colitis   . Back pain    Past Surgical History  Procedure Laterality Date  . Tonsillectomy    . Abdominal hysterectomy    . Cholecystectomy    . Appendectomy     Social History:  reports that she has never smoked. She has never used smokeless tobacco. She reports that she does not drink alcohol or use illicit drugs.  Allergies  Allergen Reactions  . Esomeprazole Magnesium   . Omeprazole   . Pantoprazole Sodium   . Rabeprazole Sodium   . Sulfonamide Derivatives     History reviewed. No pertinent family history.   Prior  to Admission medications   Medication Sig Start Date End Date Taking? Authorizing Provider  amitriptyline (ELAVIL) 25 MG tablet Take 25 mg by mouth at bedtime.   Yes Historical Provider, MD  amLODipine (NORVASC) 5 MG tablet Take 5 mg by mouth daily.   Yes Historical Provider, MD  EPINEPHrine (EPIPEN) 0.3 mg/0.3 mL SOAJ injection Inject 0.3 mg into the muscle once as needed. Allergic reaction   Yes Historical Provider, MD  fluticasone (FLONASE) 50 MCG/ACT nasal spray Place 1 spray into both nostrils daily.   Yes Historical Provider, MD  hydrochlorothiazide (MICROZIDE) 12.5 MG capsule Take 12.5 mg by mouth daily.   Yes Historical Provider, MD  losartan (COZAAR) 100 MG tablet Take 100 mg by mouth daily.   Yes Historical Provider, MD  Multiple Vitamins-Minerals (CENTRUM SILVER) tablet Take 1 tablet by mouth daily.   Yes Historical Provider, MD  Nebivolol HCl (BYSTOLIC) 20 MG TABS Take 20 mg by mouth daily.   Yes Historical Provider, MD  nitroGLYCERIN (NITROSTAT) 0.4 MG SL tablet Place 0.4 mg under the tongue every 5 (five) minutes as needed for chest pain.   Yes Historical Provider, MD  ranitidine (ZANTAC) 150 MG tablet Take 150 mg by mouth 2 (two) times daily.   Yes Historical Provider, MD  simvastatin (ZOCOR) 20 MG tablet Take 20 mg by mouth at bedtime.   Yes Historical Provider, MD   Physical Exam: Filed Vitals:   03/21/13 1635  BP: 133/59  Pulse: 76  Temp:   Resp: 20    BP 133/59  Pulse 76  Temp(Src)  97.4 F (36.3 C) (Oral)  Resp 20  Ht 5\' 3"  (1.6 m)  Wt 72.122 kg (159 lb)  BMI 28.17 kg/m2  SpO2 96%  General:  Appears calm and comfortable Eyes: PERRL, normal lids, irises & conjunctiva ENT: grossly normal hearing, lips & tongue Neck: no LAD, masses or thyromegaly Cardiovascular: RRR, no m/r/g. No LE edema. Telemetry: SR, no arrhythmias  Respiratory: CTA bilaterally, no w/r/r. Normal respiratory effort. Abdomen: soft, tenderness in periumbilical area and right upper  quadrant Skin: no rash or induration seen on limited exam Musculoskeletal: grossly normal tone BUE/BLE Psychiatric: grossly normal mood and affect, speech fluent and appropriate Neurologic: grossly non-focal.          Labs on Admission:  Basic Metabolic Panel:  Recent Labs Lab 03/21/13 1325  NA 134*  K 3.6*  CL 94*  CO2 23  GLUCOSE 166*  BUN 23  CREATININE 0.99  CALCIUM 9.5   Liver Function Tests:  Recent Labs Lab 03/21/13 1326  AST 27  ALT 22  ALKPHOS 68  BILITOT 0.3  PROT 6.9  ALBUMIN 3.9    Recent Labs Lab 03/21/13 1326  LIPASE 34   No results found for this basename: AMMONIA,  in the last 168 hours CBC:  Recent Labs Lab 03/21/13 1326  WBC 7.6  NEUTROABS 6.0  HGB 12.3  HCT 37.0  MCV 91.6  PLT 288   Cardiac Enzymes:  Recent Labs Lab 03/21/13 1326 03/21/13 1524  TROPONINI <0.30 <0.30    BNP (last 3 results) No results found for this basename: PROBNP,  in the last 8760 hours CBG: No results found for this basename: GLUCAP,  in the last 168 hours  Radiological Exams on Admission: Ct Abdomen Pelvis W Contrast  03/21/2013   CLINICAL DATA:  Abdominal pain radiating to chest, hypertension, asthma, past history of C difficile colitis  EXAM: CT ABDOMEN AND PELVIS WITH CONTRAST  TECHNIQUE: Multidetector CT imaging of the abdomen and pelvis was performed using the standard protocol following bolus administration of intravenous contrast. Sagittal and coronal MPR images reconstructed from axial data set.  CONTRAST:  50mL OMNIPAQUE IOHEXOL 300 MG/ML SOLN orally, 100mL OMNIPAQUE IOHEXOL 300 MG/ML SOLN  COMPARISON:  None.  FINDINGS: Minimal atelectasis at lung bases. Mild intrahepatic and extrahepatic biliary dilatation post cholecystectomy.  Small exophytic cyst laterally mid to upper left kidney 16 x 14 mm image 31.  Additional tiny cyst upper pole right kidney.  Liver, spleen, pancreas, kidneys, and adrenal glands normal appearance.  Small bowel loop in right  mid abdomen demonstrates wall thickening and associated mesenteric edema compatible with enteritis.  Proximal small bowel loops are slightly greater in diameters than distal.  Small amount of free intraperitoneal fluid is seen perihepatic, interloop, and in the pelvis.  No definite evidence of bowel obstruction or perforation.  Tiny hiatal hernia.  Colon unremarkable.  Appendix not visualized.  No urinary tract calcification or dilatation.  Mild sigmoid diverticulosis without evidence of diverticulitis.  Uterus surgically absent with normal size of left ovary.  Right ovary and appendix not definitely visualized.  No additional mass, adenopathy, free air or hernia.  Bones appear demineralized with scattered degenerative disc disease changes greatest at L5-S1.  IMPRESSION: Abnormal loop of small bowel in the right mid abdomen demonstrating wall thickening and associated mesenteric edema, favor focal enteritis though adhesion with obstruction is not completely excluded.  Small amount of associated free intraperitoneal fluid.  Sigmoid diverticulosis without evidence of diverticulitis.   Electronically Signed   By:  Ulyses Southward M.D.   On: 03/21/2013 16:02   Dg Abd Acute W/chest  03/21/2013   CLINICAL DATA:  Abdominal pain radiates to chest.  EXAM: ACUTE ABDOMEN SERIES (ABDOMEN 2 VIEW & CHEST 1 VIEW)  COMPARISON:  No comparison chest x-ray. Comparison CT abdomen pelvis 12/07/2009.  FINDINGS: Mild cardiomegaly.  Central pulmonary vascular prominence.  Basilar subsegmental atelectasis. No segmental consolidation, pulmonary edema or gross pneumothorax.  Calcified tortuous aorta.  Paucity of bowel gas. No plain film evidence of bowel obstruction or free intraperitoneal air.  IMPRESSION: Paucity of bowel gas. No plain film evidence of bowel obstruction or free intraperitoneal air.  Mild cardiomegaly.  Central pulmonary vascular prominence.  Calcified tortuous aorta.   Electronically Signed   By: Bridgett Larsson M.D.   On:  03/21/2013 14:00    EKG: Independently reviewed. LBBB  Assessment/Plan Active Problems:   ABDOMINAL PAIN   Enteritis   Vomiting   LBBB (left bundle branch block)   1. Vomiting, possibly due to enteritis. The patient will be kept on clear liquids for now and treat supportively with antiemetics. She does not have any ongoing vomiting at this time and therefore NG tube is not indicated. She does not have any significant abdominal distention, although she has not had any significant bowel movement or flatus since yesterday. We'll treat supportively with bowel rest, IV fluids and antiemetics. The patient will receive tocolysis enema to stimulate bowel function. She'll also be covered empirically with ciprofloxacin Flagyl. When she is able to tolerate by mouth, she can be discharged home. 2. Left bundle-branch block. Unclear if this is new or chronic finding. She does not have any significant chest pain or other signs of acute ischemia. We will cycle cardiac markers and request prior EKG.  Code Status: full code Family Communication: discussed with patient Disposition Plan: discharge home once improved  Time spent:  [facility] Pager [phone]

## 2013-03-21 NOTE — ED Notes (Signed)
RN called lab to inquire about bmp-order received by lab, results pending.

## 2013-03-21 NOTE — ED Notes (Signed)
Pt c/o abdominal pain that "radiates to chest" since this morning. Pt states pain is constant and sharp. Pt states "I feel like I'm going to pass out". Pt has hx of mitral valve prolapse and has nitroglycerin at home. Pt had one nitro tablet pta but denies any relief from pain.

## 2013-03-21 NOTE — ED Provider Notes (Addendum)
CSN: [id number]     Arrival date & time 03/21/13  1239 History  This chart was scribed for Rhonda Skeens, MD,  by Ashley Jacobs, ED Scribe. The patient was seen in room APA01/APA01 and the patient's care was started at 1:10 PM.    First MD Initiated Contact with Patient 03/21/13 1301     Chief Complaint  Patient presents with  . Chest Pain  . Abdominal Pain  . Shortness of Breath   (Consider location/radiation/quality/duration/timing/severity/associated sxs/prior Treatment) Patient is a 68 y.o. female presenting with abdominal pain and shortness of breath. The history is provided by the patient and medical records. No language interpreter was used.  Abdominal Pain Associated symptoms: chest pain and shortness of breath   Associated symptoms: no chills, no diarrhea and no dysuria   Shortness of Breath Associated symptoms: abdominal pain and chest pain   Associated symptoms: no neck pain    HPI Comments: Rhonda Murphy is a 68 y.o. female who presents to the Emergency Department complaining of chest pain, abdominal pain, and SOB with onset of this morning. This morning she states she is experiencing sharp pain that is moving upward from her abdomen to her chest. The pain is centrally located towards her midline torso which radiates to laterally and to her back. She is experiencing severe back pain that is sharp in nature. Pt has tried a suppository, Zantac and NTG which did not improve her symptoms. She denies being recent hospitalization. Pt had a appendectomy, cholectomy, and hysterectomy. She denies any prior heart complications. Past Medical History  Diagnosis Date  . Mitral valve prolapse   . Hypertension   . Acid reflux   . Asthma   . Shingles   . C. difficile colitis   . Back pain    Past Medical History  Diagnosis Date  . Mitral valve prolapse   . Hypertension   . Acid reflux   . Asthma   . Shingles   . C. difficile colitis   . Back pain    Past Surgical History   Procedure Laterality Date  . Tonsillectomy    . Abdominal hysterectomy    . Cholecystectomy    . Appendectomy     History reviewed. No pertinent family history. History  Substance Use Topics  . Smoking status: Never Smoker   . Smokeless tobacco: Never Used  . Alcohol Use: No   OB History   Grav Para Term Preterm Abortions TAB SAB Ect Mult Living                 Review of Systems  Constitutional: Negative for chills.  Respiratory: Positive for shortness of breath.   Cardiovascular: Positive for chest pain and leg swelling.  Gastrointestinal: Positive for abdominal pain. Negative for diarrhea and blood in stool.  Genitourinary: Negative for dysuria.  Musculoskeletal: Positive for back pain. Negative for joint swelling and neck pain.  All other systems reviewed and are negative.    Allergies  Esomeprazole magnesium; Omeprazole; Pantoprazole sodium; Rabeprazole sodium; and Sulfonamide derivatives  Home Medications  No current outpatient prescriptions on file. BP 79/33  Pulse 62  Temp(Src) 97.4 F (36.3 C) (Oral)  Resp 22  Ht 5\' 3"  (1.6 m)  Wt 159 lb (72.122 kg)  BMI 28.17 kg/m2  SpO2 100% Physical Exam  Nursing note and vitals reviewed. Constitutional: She is oriented to person, place, and time. She appears well-developed and well-nourished. No distress.  HENT:  Head: Normocephalic and atraumatic.  Mouth/Throat: Oropharynx  is clear and moist.  Dry mucus membrane   Eyes: EOM are normal. Pupils are equal, round, and reactive to light. No scleral icterus.  Conjunctiva  is pale   Neck: Normal range of motion. Neck supple.  Cardiovascular: Normal rate, regular rhythm and intact distal pulses.   No murmur heard. Aorta size 1.8 cm in diameter   Pulmonary/Chest: Effort normal and breath sounds normal. No stridor. No respiratory distress. She has no rales.  Abdominal: Soft. She exhibits no distension. There is tenderness. There is no rigidity, no rebound and no  guarding.  Diffuse moderate abdominal tenderness  Musculoskeletal: Normal range of motion. She exhibits no edema and no tenderness.  Mild edema lower extremity left worse than right   Neurological: She is alert and oriented to person, place, and time.  Skin: Skin is warm and dry. No rash noted.  Psychiatric: She has a normal mood and affect. Her behavior is normal.    ED Course  Procedures (including critical care time) EMERGENCY DEPARTMENT ULTRASOUND  Study: Limited Retroperitoneal Ultrasound of the Abdominal Aorta.  INDICATIONS:abo pain, hypotension, age Multiple views of the abdominal aorta were obtained in real-time from the diaphragmatic hiatus to the aortic bifurcation in transverse planes with a multi-frequency probe. PERFORMED BY: Myself IMAGES ARCHIVED?: Yes FINDINGS: Maximum aortic dimensions are 1.8 cm LIMITATIONS:  Body habitus and Abdominal pain INTERPRETATION:  No abdominal aortic aneurysm    DIAGNOSTIC STUDIES: Oxygen Saturation is 100% on room air, normal by my interpretation.    COORDINATION OF CARE:  1:13 PM Discussed course of care with pt . Pt understands and agrees.  Labs Review Labs Reviewed  CBC WITH DIFFERENTIAL - Abnormal; Notable for the following:    Neutrophils Relative % 79 (*)    All other components within normal limits  BASIC METABOLIC PANEL - Abnormal; Notable for the following:    Sodium 134 (*)    Potassium 3.6 (*)    Chloride 94 (*)    Glucose, Bld 166 (*)    GFR calc non Af Amer 57 (*)    GFR calc Af Amer 66 (*)    All other components within normal limits  TROPONIN I  LACTIC ACID, PLASMA  LIPASE, BLOOD  URINALYSIS, ROUTINE W REFLEX MICROSCOPIC  HEPATIC FUNCTION PANEL  TROPONIN I   Imaging Review Ct Abdomen Pelvis W Contrast  03/21/2013   CLINICAL DATA:  Abdominal pain radiating to chest, hypertension, asthma, past history of C difficile colitis  EXAM: CT ABDOMEN AND PELVIS WITH CONTRAST  TECHNIQUE: Multidetector CT imaging of  the abdomen and pelvis was performed using the standard protocol following bolus administration of intravenous contrast. Sagittal and coronal MPR images reconstructed from axial data set.  CONTRAST:  50mL OMNIPAQUE IOHEXOL 300 MG/ML SOLN orally, 100mL OMNIPAQUE IOHEXOL 300 MG/ML SOLN  COMPARISON:  None.  FINDINGS: Minimal atelectasis at lung bases. Mild intrahepatic and extrahepatic biliary dilatation post cholecystectomy.  Small exophytic cyst laterally mid to upper left kidney 16 x 14 mm image 31.  Additional tiny cyst upper pole right kidney.  Liver, spleen, pancreas, kidneys, and adrenal glands normal appearance.  Small bowel loop in right mid abdomen demonstrates wall thickening and associated mesenteric edema compatible with enteritis.  Proximal small bowel loops are slightly greater in diameters than distal.  Small amount of free intraperitoneal fluid is seen perihepatic, interloop, and in the pelvis.  No definite evidence of bowel obstruction or perforation.  Tiny hiatal hernia.  Colon unremarkable.  Appendix not visualized.  No  urinary tract calcification or dilatation.  Mild sigmoid diverticulosis without evidence of diverticulitis.  Uterus surgically absent with normal size of left ovary.  Right ovary and appendix not definitely visualized.  No additional mass, adenopathy, free air or hernia.  Bones appear demineralized with scattered degenerative disc disease changes greatest at L5-S1.  IMPRESSION: Abnormal loop of small bowel in the right mid abdomen demonstrating wall thickening and associated mesenteric edema, favor focal enteritis though adhesion with obstruction is not completely excluded.  Small amount of associated free intraperitoneal fluid.  Sigmoid diverticulosis without evidence of diverticulitis.   Electronically Signed   By: Ulyses Southward M.D.   On: 03/21/2013 16:02   Dg Abd Acute W/chest  03/21/2013   CLINICAL DATA:  Abdominal pain radiates to chest.  EXAM: ACUTE ABDOMEN SERIES (ABDOMEN 2  VIEW & CHEST 1 VIEW)  COMPARISON:  No comparison chest x-ray. Comparison CT abdomen pelvis 12/07/2009.  FINDINGS: Mild cardiomegaly.  Central pulmonary vascular prominence.  Basilar subsegmental atelectasis. No segmental consolidation, pulmonary edema or gross pneumothorax.  Calcified tortuous aorta.  Paucity of bowel gas. No plain film evidence of bowel obstruction or free intraperitoneal air.  IMPRESSION: Paucity of bowel gas. No plain film evidence of bowel obstruction or free intraperitoneal air.  Mild cardiomegaly.  Central pulmonary vascular prominence.  Calcified tortuous aorta.   Electronically Signed   By: Bridgett Larsson M.D.   On: 03/21/2013 14:00    EKG Interpretation    Date/Time:  Tuesday March 21 2013 12:51:20 EST Ventricular Rate:  58 PR Interval:  192 QRS Duration: 136 QT Interval:  558 QTC Calculation: 547 R Axis:   -28 Text Interpretation:  Sinus bradycardia Left bundle branch block Abnormal ECG No previous ECGs available Confirmed by Sadao Weyer  MD, Anniah Glick ([id number]) on 03/21/2013 2:31:34 PM            MDM   1. LBBB (left bundle branch block)   2. Acute chest pain   3. Abdominal pain   4. Vomiting   Dehydration Enteritis  I personally performed the services described in this documentation, which was scribed in my presence. The recorded information has been reviewed and is accurate..  Acute abd pain, vomiting and hypotension. Bedside US no AAA seen. Concern for AAA vs bowel obstruction vs colitis vs ischemic colitis vs other.  Chest pain atpical and a radiation from abdomen,  Pain meds, fluids, nausea meds. Mild improvement however sxs persisted on recheck.  CT showed enteritis, lactate normal, possible SBO. Discussed with Dr Kerry Hough, accepted tele, no cp in ED.  Abx for enteritis given, fluids given IV.  The patients results and plan were reviewed and discussed.   Any x-rays performed were personally reviewed by myself.   Differential diagnosis were considered  with the presenting HPI.  Diagnosis: above EKG: LBBB Admission/ observation were discussed with the admitting physician, patient and/or family and they are comfortable with the plan.   Rhonda Skeens, MD 03/21/13 1645  Rhonda Skeens, MD 03/21/13 [phone]

## 2013-03-22 LAB — BASIC METABOLIC PANEL
BUN: 17 mg/dL (ref 6–23)
CO2: 26 meq/L (ref 19–32)
Calcium: 8.6 mg/dL (ref 8.4–10.5)
Chloride: 97 mEq/L (ref 96–112)
Creatinine, Ser: 0.97 mg/dL (ref 0.50–1.10)
GFR calc Af Amer: 68 mL/min — ABNORMAL LOW (ref >90)
GFR, EST NON AFRICAN AMERICAN: 58 mL/min — AB (ref >90)
Glucose, Bld: 107 mg/dL — ABNORMAL HIGH (ref 70–99)
Potassium: 3.6 mEq/L — ABNORMAL LOW (ref 3.7–5.3)
SODIUM: 135 meq/L — AB (ref 137–147)

## 2013-03-22 LAB — TROPONIN I: Troponin I: <0.3 ng/mL (ref <0.30)

## 2013-03-22 MED ORDER — BISACODYL 5 MG PO TBEC
10.0000 mg | DELAYED_RELEASE_TABLET | Freq: Once | ORAL | Status: AC
  Administered 2013-03-22: 10 mg via ORAL
  Filled 2013-03-22: qty 2

## 2013-03-22 MED ORDER — PROMETHAZINE HCL 25 MG/ML IJ SOLN: 12.5000 mg | Freq: Once | INTRAMUSCULAR | Status: DC

## 2013-03-22 MED ORDER — MILK AND MOLASSES ENEMA
1.0000 | Freq: Once | RECTAL | Status: AC
  Administered 2013-03-22: 250 mL via RECTAL

## 2013-03-22 NOTE — Progress Notes (Signed)
03/22/12 0152 Phenergan order placed per MD. On reassessment, patient asleep, appeared comfortable. Will monitor and give phenergan for any further nausea/vomiting. Earnstine RegalAshley Magnus Crescenzo, RN

## 2013-03-22 NOTE — Progress Notes (Signed)
03/22/13 0121 Patient became nauseated while up to bathroom, vomited 200 ml bile colored emesis. zofran not due until about 0245. Assisted back to bed. Text-paged Dr. Rito EhrlichKrishnan to notify. Earnstine RegalAshley Cheila Wickstrom, RN

## 2013-03-22 NOTE — Progress Notes (Addendum)
[facility] PROGRESS NOTE  Rhonda Murphy ZOX:[id number] DOB: Dec 13, 1945 DOA: 03/21/2013 PCP: Romeo Rabon, MD  Assessment/Plan: 1. Vomiting, due to enteritis versus partial small bowel obstruction. The patient is currently on clear liquids. She's not had any bowel movements yet. Will provide the patient with another milk of molasses enema as well as Dulcolax by mouth. Continue antibiotics at this time. Advance diet as tolerated. 2. Left bundle-branch block. Cardiac enzymes are negative she does not have any other symptoms. Await records from primary care doctor.  Code Status: Full code Family Communication: Discussed with patient Disposition Plan: Discharge home once improved   Consultants:  None  Procedures:  None  Antibiotics:  Ciprofloxacin 1/6  Metronidazole 1/6  HPI/Subjective: Patient had some vomiting last night after liquids. She's not had any vomiting since then. No bowel movements. She's not passing flatus per  Objective: Filed Vitals:   03/22/13 1500  BP: 160/66  Pulse: 91  Temp: 98.2 F (36.8 C)  Resp: 18    Intake/Output Summary (Last 24 hours) at 03/22/13 2011 Last data filed at 03/22/13 1700  Gross per 24 hour  Intake 1224.67 ml  Output    200 ml  Net 1024.67 ml   Filed Weights   03/21/13 1245 03/21/13 2056  Weight: 72.122 kg (159 lb) 70 kg (154 lb 5.2 oz)    Exam:   General:  NAD   Cardiovascular: S1, S2, regular rate and rhythm  Respiratory: Clear to auscultation bilaterally  Abdomen: Soft, tender in the lower abdomen. positive bowel sounds  Musculoskeletal: No pedal edema bilaterally   Data Reviewed: Basic Metabolic Panel:  Recent Labs Lab 03/21/13 1325 03/22/13 0711  NA 134* 135*  K 3.6* 3.6*  CL 94* 97  CO2 23 26  GLUCOSE 166* 107*  BUN 23 17  CREATININE 0.99 0.97  CALCIUM 9.5 8.6   Liver Function Tests:  Recent Labs Lab 03/21/13 1326  AST 27  ALT 22  ALKPHOS 68  BILITOT 0.3  PROT 6.9  ALBUMIN 3.9     Recent Labs Lab 03/21/13 1326  LIPASE 34   No results found for this basename: AMMONIA,  in the last 168 hours CBC:  Recent Labs Lab 03/21/13 1326  WBC 7.6  NEUTROABS 6.0  HGB 12.3  HCT 37.0  MCV 91.6  PLT 288   Cardiac Enzymes:  Recent Labs Lab 03/21/13 1326 03/21/13 1524 03/21/13 2012 03/22/13 0126 03/22/13 0711  TROPONINI <0.30 <0.30 <0.30 <0.30 <0.30   BNP (last 3 results) No results found for this basename: PROBNP,  in the last 8760 hours CBG: No results found for this basename: GLUCAP,  in the last 168 hours  No results found for this or any previous visit (from the past 240 hour(s)).   Studies: Ct Abdomen Pelvis W Contrast  03/21/2013   CLINICAL DATA:  Abdominal pain radiating to chest, hypertension, asthma, past history of C difficile colitis  EXAM: CT ABDOMEN AND PELVIS WITH CONTRAST  TECHNIQUE: Multidetector CT imaging of the abdomen and pelvis was performed using the standard protocol following bolus administration of intravenous contrast. Sagittal and coronal MPR images reconstructed from axial data set.  CONTRAST:  50mL OMNIPAQUE IOHEXOL 300 MG/ML SOLN orally, OMNIPAQUE IOHEXOL 300 MG/ML SOLN  COMPARISON:  None.  FINDINGS: Minimal atelectasis at lung bases. Mild intrahepatic and extrahepatic biliary dilatation post cholecystectomy.  Small exophytic cyst laterally mid to upper left kidney 16 x 14 mm image 31.  Additional tiny cyst upper pole right kidney.  Liver, spleen,  pancreas, kidneys, and adrenal glands normal appearance.  Small bowel loop in right mid abdomen demonstrates wall thickening and associated mesenteric edema compatible with enteritis.  Proximal small bowel loops are slightly greater in diameters than distal.  Small amount of free intraperitoneal fluid is seen perihepatic, interloop, and in the pelvis.  No definite evidence of bowel obstruction or perforation.  Tiny hiatal hernia.  Colon unremarkable.  Appendix not visualized.  No urinary  tract calcification or dilatation.  Mild sigmoid diverticulosis without evidence of diverticulitis.  Uterus surgically absent with normal size of left ovary.  Right ovary and appendix not definitely visualized.  No additional mass, adenopathy, free air or hernia.  Bones appear demineralized with scattered degenerative disc disease changes greatest at L5-S1.  IMPRESSION: Abnormal loop of small bowel in the right mid abdomen demonstrating wall thickening and associated mesenteric edema, favor focal enteritis though adhesion with obstruction is not completely excluded.  Small amount of associated free intraperitoneal fluid.  Sigmoid diverticulosis without evidence of diverticulitis.   Electronically Signed   By: Ulyses SouthwardMark  Boles M.D.   On: 03/21/2013 16:02   Dg Abd Acute W/chest  03/21/2013   CLINICAL DATA:  Abdominal pain radiates to chest.  EXAM: ACUTE ABDOMEN SERIES (ABDOMEN 2 VIEW & CHEST 1 VIEW)  COMPARISON:  No comparison chest x-ray. Comparison CT abdomen pelvis 12/07/2009.  FINDINGS: Mild cardiomegaly.  Central pulmonary vascular prominence.  Basilar subsegmental atelectasis. No segmental consolidation, pulmonary edema or gross pneumothorax.  Calcified tortuous aorta.  Paucity of bowel gas. No plain film evidence of bowel obstruction or free intraperitoneal air.  IMPRESSION: Paucity of bowel gas. No plain film evidence of bowel obstruction or free intraperitoneal air.  Mild cardiomegaly.  Central pulmonary vascular prominence.  Calcified tortuous aorta.   Electronically Signed   By: Bridgett LarssonSteve  Olson M.D.   On: 03/21/2013 14:00    Scheduled Meds: . amitriptyline  25 mg Oral QHS  . amLODipine  5 mg Oral Daily  . ciprofloxacin  400 mg Intravenous Q12H  . enoxaparin (LOVENOX) injection  40 mg Subcutaneous Q24H  . losartan  100 mg Oral Daily  . metronidazole  500 mg Intravenous Q8H  . milk and molasses  1 enema Rectal Once  . nebivolol  20 mg Oral Daily  . promethazine  12.5 mg Intravenous Once  . simvastatin   20 mg Oral QHS  . sodium chloride  3 mL Intravenous Q12H   Continuous Infusions: . 0.45 % NaCl with KCl 20 mEq / L 100 mL/hr at 03/21/13 2313    Active Problems:   ABDOMINAL PAIN   Enteritis   Vomiting   LBBB (left bundle branch block)    Time spent: 35mins    Rhonda Murphy  [facility] Pager [phone]. If 7PM-7AM, please contact night-coverage at [email or web address], password [email or web address] 03/22/2013, 8:11 PM  LOS: 1 day

## 2013-03-22 NOTE — Progress Notes (Signed)
UR completed. Patient changed to inpatient r/t requiring IVF @ 100cc/hr and IV antibiotics.

## 2013-03-23 ENCOUNTER — Inpatient Hospital Stay (HOSPITAL_COMMUNITY): Payer: Medicare Other

## 2013-03-23 DIAGNOSIS — K219 Gastro-esophageal reflux disease without esophagitis: Secondary | ICD-10-CM

## 2013-03-23 DIAGNOSIS — R509 Fever, unspecified: Secondary | ICD-10-CM | POA: Diagnosis present

## 2013-03-23 LAB — CLOSTRIDIUM DIFFICILE BY PCR: Toxigenic C. Difficile by PCR: NEGATIVE

## 2013-03-23 LAB — INFLUENZA PANEL BY PCR (TYPE A & B)
H1N1 flu by pcr: NOT DETECTED
Influenza A By PCR: POSITIVE — AB
Influenza B By PCR: NEGATIVE

## 2013-03-23 MED ORDER — FAMOTIDINE 20 MG PO TABS
ORAL_TABLET | ORAL | Status: AC
  Administered 2013-03-23: 20 mg
  Filled 2013-03-23: qty 1

## 2013-03-23 MED ORDER — OSELTAMIVIR PHOSPHATE 75 MG PO CAPS
75.0000 mg | ORAL_CAPSULE | Freq: Every day | ORAL | Status: DC
  Administered 2013-03-23 – 2013-03-24 (×2): 75 mg via ORAL
  Filled 2013-03-23 (×2): qty 1

## 2013-03-23 MED ORDER — FAMOTIDINE 20 MG PO TABS
20.0000 mg | ORAL_TABLET | Freq: Two times a day (BID) | ORAL | Status: DC
  Administered 2013-03-24 – 2013-03-26 (×5): 20 mg via ORAL
  Filled 2013-03-23 (×5): qty 1

## 2013-03-23 MED ORDER — ALUM & MAG HYDROXIDE-SIMETH 200-200-20 MG/5ML PO SUSP
15.0000 mL | ORAL | Status: DC | PRN
  Filled 2013-03-23: qty 30

## 2013-03-23 MED ORDER — OSELTAMIVIR PHOSPHATE 75 MG PO CAPS: 75.0000 mg | ORAL_CAPSULE | Freq: Two times a day (BID) | ORAL | Status: DC

## 2013-03-23 NOTE — Progress Notes (Signed)
[facility] PROGRESS NOTE  Ulla GalloCarolyn E Hellstrom GNF:[id number] DOB: [birth year] DOA: 03/21/2013 PCP: Romeo RabonAPLAN,MICHAEL, MD  Assessment/Plan: 1. Vomiting, due to enteritis versus partial small bowel obstruction. The patient is currently on clear liquids. Since she had vomiting this morning, will avoid advancing diet at this time. She is moving her bowels now. Will check abdominal xray to evaluate for any signs of obstruction. Check stool for C diff with low grade fever and frequent stools now.  Continue ciprofloxacin and flagyl. 2. Left bundle-branch block. Cardiac enzymes are negative she does not have any other symptoms. Await records from primary care doctor. 3. Fever.  Etiology not clear. Will check stool for c diff. Check influenza pcr since she is also coughing, along with a chest xray. Repeat labs in am.  Code Status: Full code Family Communication: Discussed with patient Disposition Plan: Discharge home once improved   Consultants:  None  Procedures:  None  Antibiotics:  Ciprofloxacin 1/6  Metronidazole 1/6  HPI/Subjective: Patient had 3 episodes of vomiting this morning, dark "stool" colored vomitus.  She has had 6 bowel movements in the last few hours and is passing flatus.  She has been having low grade fever since last night. She continues with productive cough.  Objective: Filed Vitals:   03/23/13 1400  BP: 143/62  Pulse: 83  Temp: 100.1 F (37.8 C)  Resp: 18    Intake/Output Summary (Last 24 hours) at 03/23/13 1738 Last data filed at 03/23/13 0550  Gross per 24 hour  Intake   2900 ml  Output      0 ml  Net   2900 ml   Filed Weights   03/21/13 1245 03/21/13 2056  Weight: 72.122 kg (159 lb) 70 kg (154 lb 5.2 oz)    Exam:   General:  NAD   Cardiovascular: S1, S2, regular rate and rhythm  Respiratory: Clear to auscultation bilaterally, diminished breath sounds at bases.  Abdomen: Soft, tender in the peri umbilical area. positive bowel  sounds  Musculoskeletal: No pedal edema bilaterally   Data Reviewed: Basic Metabolic Panel:  Recent Labs Lab 03/21/13 1325 03/22/13 0711  NA 134* 135*  K 3.6* 3.6*  CL 94* 97  CO2 23 26  GLUCOSE 166* 107*  BUN 23 17  CREATININE 0.99 0.97  CALCIUM 9.5 8.6   Liver Function Tests:  Recent Labs Lab 03/21/13 1326  AST 27  ALT 22  ALKPHOS 68  BILITOT 0.3  PROT 6.9  ALBUMIN 3.9    Recent Labs Lab 03/21/13 1326  LIPASE 34   No results found for this basename: AMMONIA,  in the last 168 hours CBC:  Recent Labs Lab 03/21/13 1326  WBC 7.6  NEUTROABS 6.0  HGB 12.3  HCT 37.0  MCV 91.6  PLT 288   Cardiac Enzymes:  Recent Labs Lab 03/21/13 1326 03/21/13 1524 03/21/13 2012 03/22/13 0126 03/22/13 0711  TROPONINI <0.30 <0.30 <0.30 <0.30 <0.30   BNP (last 3 results) No results found for this basename: PROBNP,  in the last 8760 hours CBG: No results found for this basename: GLUCAP,  in the last 168 hours  No results found for this or any previous visit (from the past 240 hour(s)).   Studies: No results found.  Scheduled Meds: . amitriptyline  25 mg Oral QHS  . amLODipine  5 mg Oral Daily  . ciprofloxacin  400 mg Intravenous Q12H  . enoxaparin (LOVENOX) injection  40 mg Subcutaneous Q24H  . famotidine  20 mg Oral BID  .  losartan  100 mg Oral Daily  . metronidazole  500 mg Intravenous Q8H  . nebivolol  20 mg Oral Daily  . promethazine  12.5 mg Intravenous Once  . simvastatin  20 mg Oral QHS  . sodium chloride  3 mL Intravenous Q12H   Continuous Infusions: . 0.45 % NaCl with KCl 20 mEq / L 100 mL/hr at 03/23/13 1511    Active Problems:   ABDOMINAL PAIN   Enteritis   Vomiting   LBBB (left bundle branch block)    Time spent:    MEMON,JEHANZEB  [facility] Pager [phone]. If 7PM-7AM, please contact night-coverage at [email or web address], password [facility] 03/23/2013, 5:38 PM  LOS: 2 days

## 2013-03-24 DIAGNOSIS — K56609 Unspecified intestinal obstruction, unspecified as to partial versus complete obstruction: Secondary | ICD-10-CM | POA: Diagnosis present

## 2013-03-24 DIAGNOSIS — J111 Influenza due to unidentified influenza virus with other respiratory manifestations: Secondary | ICD-10-CM

## 2013-03-24 DIAGNOSIS — J101 Influenza due to other identified influenza virus with other respiratory manifestations: Secondary | ICD-10-CM

## 2013-03-24 HISTORY — DX: Influenza due to other identified influenza virus with other respiratory manifestations: J10.1

## 2013-03-24 HISTORY — DX: Unspecified intestinal obstruction, unspecified as to partial versus complete obstruction: K56.609

## 2013-03-24 LAB — CBC
HCT: 32.5 % — ABNORMAL LOW (ref 36.0–46.0)
HEMOGLOBIN: 10.7 g/dL — AB (ref 12.0–15.0)
MCH: 30.8 pg (ref 26.0–34.0)
MCHC: 32.9 g/dL (ref 30.0–36.0)
MCV: 93.7 fL (ref 78.0–100.0)
Platelets: 191 10*3/uL (ref 150–400)
RBC: 3.47 MIL/uL — ABNORMAL LOW (ref 3.87–5.11)
RDW: 13 % (ref 11.5–15.5)
WBC: 2.8 10*3/uL — ABNORMAL LOW (ref 4.0–10.5)

## 2013-03-24 LAB — BASIC METABOLIC PANEL
BUN: 9 mg/dL (ref 6–23)
CALCIUM: 8.1 mg/dL — AB (ref 8.4–10.5)
CO2: 23 mEq/L (ref 19–32)
Chloride: 101 mEq/L (ref 96–112)
Creatinine, Ser: 0.96 mg/dL (ref 0.50–1.10)
GFR, EST AFRICAN AMERICAN: 68 mL/min — AB (ref >90)
GFR, EST NON AFRICAN AMERICAN: 59 mL/min — AB (ref >90)
GLUCOSE: 101 mg/dL — AB (ref 70–99)
Potassium: 3.7 mEq/L (ref 3.7–5.3)
SODIUM: 135 meq/L — AB (ref 137–147)

## 2013-03-24 MED ORDER — OSELTAMIVIR PHOSPHATE 30 MG PO CAPS
30.0000 mg | ORAL_CAPSULE | Freq: Two times a day (BID) | ORAL | Status: DC
  Administered 2013-03-24 – 2013-03-26 (×4): 30 mg via ORAL
  Filled 2013-03-24 (×6): qty 1

## 2013-03-24 NOTE — Progress Notes (Signed)
[facility] PROGRESS NOTE  Rhonda Murphy ZOX:[id number] DOB: [birth year] DOA: 03/21/2013 PCP: Romeo RabonAPLAN,MICHAEL, MD  Summary:  This is a 68 year old female who was brought to the hospital with abdominal pain and vomiting. Initial imaging indicated an enteritis versus small bowel obstruction. She was initially made n.p.o. and was given bowel rest along with antibiotics. With supportive care, her bowel obstruction appears to have resolved. She is now moving her bowels and her nausea and vomiting is also resolved. Her diet is being advanced to full liquids today. Stool C. difficile to be negative. She is on antibiotics. During her hospital stay, she also developed fevers with a productive cough. Chest x-ray was negative for any pneumonia, although her influenza PCR did return positive. She has been started on Tamiflu.  Assessment/Plan: 1. Vomiting, due to enteritis versus partial small bowel obstruction. Patient has started to regularly move her bowels. She's not having any further nausea and vomiting. We'll advance her diet to full liquids today. Continue ciprofloxacin and flagyl. Stool C. difficile was found to be negative 2. Left bundle-branch block on EKG. Cardiac enzymes are negative she does not have any other symptoms. I suspect this is a chronic finding. We'll check 2-D echocardiogram. 3. Influenza A. It appears the patient's fevers related to influenza infection. She's been started on Tamiflu. We'll continue with supportive treatment. Chest x-ray does not indicate any signs of pneumonia. 4. Leukopenia. Suspect is related to her viral illness. Continue to follow  Code Status: Full code Family Communication: Discussed with patient Disposition Plan: Discharge home once improved, anticipating the next 24-48 hours   Consultants:  None  Procedures:  None  Antibiotics:  Ciprofloxacin 1/6  Metronidazole 1/6  HPI/Subjective: Patient has had multiple bowel movements today. She no  longer has any nausea vomiting. She does have a productive cough  Objective: Filed Vitals:   03/24/13 1300  BP: 137/57  Pulse: 68  Temp: 99.7 F (37.6 C)  Resp: 20    Intake/Output Summary (Last 24 hours) at 03/24/13 2014 Last data filed at 03/24/13 1300  Gross per 24 hour  Intake 4916.67 ml  Output      0 ml  Net 4916.67 ml   Filed Weights   03/21/13 1245 03/21/13 2056  Weight: 72.122 kg (159 lb) 70 kg (154 lb 5.2 oz)    Exam:   General:  NAD   Cardiovascular: S1, S2, regular rate and rhythm  Respiratory: Clear to auscultation bilaterally, diminished breath sounds at bases.  Abdomen: Soft, tender in the peri umbilical area. positive bowel sounds  Musculoskeletal: No pedal edema bilaterally   Data Reviewed: Basic Metabolic Panel:  Recent Labs Lab 03/21/13 1325 03/22/13 0711 03/24/13 0621  NA 134* 135* 135*  K 3.6* 3.6* 3.7  CL 94* 97 101  CO2 23 26 23   GLUCOSE 166* 107* 101*  BUN 23 17 9   CREATININE 0.99 0.97 0.96  CALCIUM 9.5 8.6 8.1*   Liver Function Tests:  Recent Labs Lab 03/21/13 1326  AST 27  ALT 22  ALKPHOS 68  BILITOT 0.3  PROT 6.9  ALBUMIN 3.9    Recent Labs Lab 03/21/13 1326  LIPASE 34   No results found for this basename: AMMONIA,  in the last 168 hours CBC:  Recent Labs Lab 03/21/13 1326 03/24/13 0621  WBC 7.6 2.8*  NEUTROABS 6.0  --   HGB 12.3 10.7*  HCT 37.0 32.5*  MCV 91.6 93.7  PLT 288 191   Cardiac Enzymes:  Recent Labs Lab 03/21/13 1326  03/21/13 1524 03/21/13 2012 03/22/13 0126 03/22/13 0711  TROPONINI <0.30 <0.30 <0.30 <0.30 <0.30   BNP (last 3 results) No results found for this basename: PROBNP,  in the last 8760 hours CBG: No results found for this basename: GLUCAP,  in the last 168 hours  Recent Results (from the past 240 hour(s))  CLOSTRIDIUM DIFFICILE BY PCR     Status: None   Collection Time    03/23/13  6:44 PM      Result Value Range Status   C difficile by pcr NEGATIVE  NEGATIVE  Final     Studies: Dg Abd Acute W/chest  03/23/2013   CLINICAL DATA:  Cough, fever, vomiting  EXAM: ACUTE ABDOMEN SERIES (ABDOMEN 2 VIEW & CHEST 1 VIEW)  COMPARISON:  CT ABD - PELV W/ CM dated 03/21/2013  FINDINGS: The chest is clear. The heart and mediastinal contours are unremarkable. The osseous structures are unremarkable.  There is mild gaseous distention of the small bowel. There are a few small bowel air-fluid levels primarily in the right mid abdomen. There is no evidence of pneumoperitoneum, portal venous gas, or pneumatosis.  Osseous structures are unremarkable.  IMPRESSION: There are a few small bowel air-fluid levels primarily in the right mid abdomen at the site of an abnormal bowel loop seen on the CT performed 03/21/2013. This may reflect an ileus versus partial small bowel obstruction.   Electronically Signed   By: Elige Ko   On: 03/23/2013 19:36    Scheduled Meds: . amitriptyline  25 mg Oral QHS  . amLODipine  5 mg Oral Daily  . ciprofloxacin  400 mg Intravenous Q12H  . enoxaparin (LOVENOX) injection  40 mg Subcutaneous Q24H  . famotidine  20 mg Oral BID  . losartan  100 mg Oral Daily  . metronidazole  500 mg Intravenous Q8H  . nebivolol  20 mg Oral Daily  . oseltamivir  30 mg Oral BID  . promethazine  12.5 mg Intravenous Once  . simvastatin  20 mg Oral QHS  . sodium chloride  3 mL Intravenous Q12H   Continuous Infusions:    Principal Problem:   Small bowel obstruction Active Problems:   GERD   ABDOMINAL PAIN   Enteritis   Vomiting   LBBB (left bundle branch block)   Fever, unspecified   Influenza A    Time spent:    Roberto Romanoski  [facility] Pager [phone]. If 7PM-7AM, please contact night-coverage at [email or web address], password [facility] 03/24/2013, 8:14 PM  LOS: 3 days

## 2013-03-25 DIAGNOSIS — D72819 Decreased white blood cell count, unspecified: Secondary | ICD-10-CM | POA: Diagnosis present

## 2013-03-25 DIAGNOSIS — I059 Rheumatic mitral valve disease, unspecified: Secondary | ICD-10-CM

## 2013-03-25 LAB — CBC WITH DIFFERENTIAL/PLATELET
Band Neutrophils: 1 % (ref 0–10)
Basophils Absolute: 0 10*3/uL (ref 0.0–0.1)
Basophils Relative: 0 % (ref 0–1)
Blasts: 0 %
Eosinophils Absolute: 0.2 10*3/uL (ref 0.0–0.7)
Eosinophils Relative: 7 % — ABNORMAL HIGH (ref 0–5)
HCT: 33.4 % — ABNORMAL LOW (ref 36.0–46.0)
Hemoglobin: 11.1 g/dL — ABNORMAL LOW (ref 12.0–15.0)
LYMPHS PCT: 40 % (ref 12–46)
Lymphs Abs: 1.1 10*3/uL (ref 0.7–4.0)
MCH: 30.8 pg (ref 26.0–34.0)
MCHC: 33.2 g/dL (ref 30.0–36.0)
MCV: 92.8 fL (ref 78.0–100.0)
MYELOCYTES: 0 %
Metamyelocytes Relative: 0 %
Monocytes Absolute: 0.3 10*3/uL (ref 0.1–1.0)
Monocytes Relative: 12 % (ref 3–12)
NEUTROS ABS: 1.1 10*3/uL — AB (ref 1.7–7.7)
NEUTROS PCT: 40 % — AB (ref 43–77)
NRBC: 0 /100{WBCs}
PROMYELOCYTES ABS: 0 %
Platelets: 196 10*3/uL (ref 150–400)
RBC: 3.6 MIL/uL — AB (ref 3.87–5.11)
RDW: 12.7 % (ref 11.5–15.5)
WBC: 2.7 10*3/uL — ABNORMAL LOW (ref 4.0–10.5)

## 2013-03-25 LAB — BASIC METABOLIC PANEL
BUN: 7 mg/dL (ref 6–23)
CO2: 24 mEq/L (ref 19–32)
Calcium: 8.1 mg/dL — ABNORMAL LOW (ref 8.4–10.5)
Chloride: 103 mEq/L (ref 96–112)
Creatinine, Ser: 0.86 mg/dL (ref 0.50–1.10)
GFR, EST AFRICAN AMERICAN: 78 mL/min — AB (ref >90)
GFR, EST NON AFRICAN AMERICAN: 67 mL/min — AB (ref >90)
Glucose, Bld: 99 mg/dL (ref 70–99)
Potassium: 3.6 mEq/L — ABNORMAL LOW (ref 3.7–5.3)
SODIUM: 139 meq/L (ref 137–147)

## 2013-03-25 MED ORDER — CIPROFLOXACIN HCL 250 MG PO TABS
500.0000 mg | ORAL_TABLET | Freq: Two times a day (BID) | ORAL | Status: DC
  Administered 2013-03-26: 500 mg via ORAL
  Filled 2013-03-25: qty 2

## 2013-03-25 MED ORDER — METRONIDAZOLE 500 MG PO TABS
500.0000 mg | ORAL_TABLET | Freq: Three times a day (TID) | ORAL | Status: DC
  Administered 2013-03-26: 500 mg via ORAL
  Filled 2013-03-25: qty 1

## 2013-03-25 MED ORDER — POTASSIUM CHLORIDE CRYS ER 20 MEQ PO TBCR
20.0000 meq | EXTENDED_RELEASE_TABLET | Freq: Two times a day (BID) | ORAL | Status: DC
  Administered 2013-03-26: 20 meq via ORAL
  Filled 2013-03-25: qty 2

## 2013-03-25 NOTE — Progress Notes (Signed)
[facility] PROGRESS NOTE  Rhonda GalloCarolyn E Murphy ZOX:[id number] DOB: [birth year] DOA: 03/21/2013 PCP: Romeo RabonAPLAN,MICHAEL, MD  Summary:  This is a 68 year old female who was brought to the hospital with abdominal pain and vomiting. Initial imaging indicated an enteritis versus small bowel obstruction. She was initially made n.p.o. and was given bowel rest along with antibiotics. With supportive care, her bowel obstruction appears to have resolved. She is now moving her bowels and her nausea and vomiting is also resolved. Her diet is being advanced. Stool C. difficile PCR negative. She is on antibiotics. During her hospital stay, she also developed fevers with a productive cough. Chest x-ray was negative for any pneumonia, although her influenza PCR did return positive. She has been started on Tamiflu.  Assessment/Plan: 1. Vomiting, due to enteritis versus partial small bowel obstruction. This has resolved clinically. She is tolerating her regular diet. Patient has started to regularly move her bowels. She's not having any further nausea and vomiting. Continue ciprofloxacin and flagyl, but change him to by mouth.. Stool C. difficile was found to be negative 2. Left bundle-branch block on EKG. Cardiac enzymes are negative she does not have any other symptoms. I suspect this is a chronic finding. 2-D echocardiogram revealed ejection fraction 50-55% and grade 1 diastolic dysfunction. The patient reports a negative stress test approximately 4-5 years ago. 3. Influenza A. It appears the patient's fevers related to influenza infection. She's been started on Tamiflu. We'll continue with supportive treatment. Chest x-ray does not indicate any signs of pneumonia. 4. Leukopenia. Suspect is related to her viral illness. It also may be exacerbated by Cipro and Flagyl. We will continue to follow this. We'll order a vitamin B12 level and TSH for further evaluation.  Code Status: Full code Family Communication: Discussed  with patient Disposition Plan: Discharge home once improved, anticipating the next 24-48 hours   Consultants:  None  Procedures:  None  Antibiotics:  Ciprofloxacin 1/6  Metronidazole 1/6  HPI/Subjective: Patient tolerated her regular diet today. She denies nausea, vomiting, or abdominal pain. She denies subjective fever or chills.  Objective: Filed Vitals:   03/25/13 1544  BP: 152/62  Pulse: 67  Temp: 99 F (37.2 C)  Resp: 20    Intake/Output Summary (Last 24 hours) at 03/25/13 1824 Last data filed at 03/25/13 1314  Gross per 24 hour  Intake    960 ml  Output    800 ml  Net    160 ml   Filed Weights   03/21/13 1245 03/21/13 2056  Weight: 72.122 kg (159 lb) 70 kg (154 lb 5.2 oz)    Exam:   General:  NAD   Cardiovascular: S1, S2, regular rate and rhythm  Respiratory: Clear to auscultation bilaterally, diminished breath sounds at bases.  Abdomen: Soft, tender in the peri umbilical area. positive bowel sounds  Musculoskeletal: No pedal edema bilaterally   Data Reviewed: Basic Metabolic Panel:  Recent Labs Lab 03/21/13 1325 03/22/13 0711 03/24/13 0621 03/25/13 0608  NA 134* 135* 135* 139  K 3.6* 3.6* 3.7 3.6*  CL 94* 97 101 103  CO2 23 26 23 24   GLUCOSE 166* 107* 101* 99  BUN 23 17 9 7   CREATININE 0.99 0.97 0.96 0.86  CALCIUM 9.5 8.6 8.1* 8.1*   Liver Function Tests:  Recent Labs Lab 03/21/13 1326  AST 27  ALT 22  ALKPHOS 68  BILITOT 0.3  PROT 6.9  ALBUMIN 3.9    Recent Labs Lab 03/21/13 1326  LIPASE 34   No  results found for this basename: AMMONIA,  in the last 168 hours CBC:  Recent Labs Lab 03/21/13 1326 03/24/13 0621 03/25/13 0608  WBC 7.6 2.8* 2.7*  NEUTROABS 6.0  --  1.1*  HGB 12.3 10.7* 11.1*  HCT 37.0 32.5* 33.4*  MCV 91.6 93.7 92.8  PLT 288 191 196   Cardiac Enzymes:  Recent Labs Lab 03/21/13 1326 03/21/13 1524 03/21/13 2012 03/22/13 0126 03/22/13 0711  TROPONINI <0.30 <0.30 <0.30 <0.30 <0.30    BNP (last 3 results) No results found for this basename: PROBNP,  in the last 8760 hours CBG: No results found for this basename: GLUCAP,  in the last 168 hours  Recent Results (from the past 240 hour(s))  CLOSTRIDIUM DIFFICILE BY PCR     Status: None   Collection Time    03/23/13  6:44 PM      Result Value Range Status   C difficile by pcr NEGATIVE  NEGATIVE Final     Studies: Dg Abd Acute W/chest  03/23/2013   CLINICAL DATA:  Cough, fever, vomiting  EXAM: ACUTE ABDOMEN SERIES (ABDOMEN 2 VIEW & CHEST 1 VIEW)  COMPARISON:  CT ABD - PELV W/ CM dated 03/21/2013  FINDINGS: The chest is clear. The heart and mediastinal contours are unremarkable. The osseous structures are unremarkable.  There is mild gaseous distention of the small bowel. There are a few small bowel air-fluid levels primarily in the right mid abdomen. There is no evidence of pneumoperitoneum, portal venous gas, or pneumatosis.  Osseous structures are unremarkable.  IMPRESSION: There are a few small bowel air-fluid levels primarily in the right mid abdomen at the site of an abnormal bowel loop seen on the CT performed 03/21/2013. This may reflect an ileus versus partial small bowel obstruction.   Electronically Signed   By: Elige Ko   On: 03/23/2013 19:36    Scheduled Meds: . amitriptyline  25 mg Oral QHS  . amLODipine  5 mg Oral Daily  . [START ON 03/26/2013] ciprofloxacin  500 mg Oral BID  . enoxaparin (LOVENOX) injection  40 mg Subcutaneous Q24H  . famotidine  20 mg Oral BID  . losartan  100 mg Oral Daily  . [START ON 03/26/2013] metroNIDAZOLE  500 mg Oral Q8H  . nebivolol  20 mg Oral Daily  . oseltamivir  30 mg Oral BID  . promethazine  12.5 mg Intravenous Once  . simvastatin  20 mg Oral QHS  . sodium chloride  3 mL Intravenous Q12H   Continuous Infusions:    Principal Problem:   Small bowel obstruction Active Problems:   GERD   ABDOMINAL PAIN   Enteritis   Vomiting   LBBB (left bundle branch block)    Fever, unspecified   Influenza A   Leukopenia    Time spent: 20 mins    Syrai Gladwin  [facility] Pager [phone] If 7PM-7AM, please contact night-coverage at [email or web address], password [facility] 03/25/2013, 6:24 PM  LOS: 4 days

## 2013-03-26 ENCOUNTER — Encounter (HOSPITAL_COMMUNITY): Payer: Self-pay | Admitting: Internal Medicine

## 2013-03-26 LAB — CBC WITH DIFFERENTIAL/PLATELET
BASOS ABS: 0 10*3/uL (ref 0.0–0.1)
Basophils Relative: 1 % (ref 0–1)
EOS PCT: 9 % — AB (ref 0–5)
Eosinophils Absolute: 0.3 10*3/uL (ref 0.0–0.7)
HCT: 35.5 % — ABNORMAL LOW (ref 36.0–46.0)
HEMOGLOBIN: 11.6 g/dL — AB (ref 12.0–15.0)
LYMPHS PCT: 52 % — AB (ref 12–46)
Lymphs Abs: 1.7 10*3/uL (ref 0.7–4.0)
MCH: 30 pg (ref 26.0–34.0)
MCHC: 32.7 g/dL (ref 30.0–36.0)
MCV: 91.7 fL (ref 78.0–100.0)
MONOS PCT: 9 % (ref 3–12)
Monocytes Absolute: 0.3 10*3/uL (ref 0.1–1.0)
Neutro Abs: 0.9 10*3/uL — ABNORMAL LOW (ref 1.7–7.7)
Neutrophils Relative %: 29 % — ABNORMAL LOW (ref 43–77)
Platelets: 217 10*3/uL (ref 150–400)
RBC: 3.87 MIL/uL (ref 3.87–5.11)
RDW: 12.6 % (ref 11.5–15.5)
WBC: 3.2 10*3/uL — ABNORMAL LOW (ref 4.0–10.5)

## 2013-03-26 LAB — BASIC METABOLIC PANEL
BUN: 7 mg/dL (ref 6–23)
CHLORIDE: 102 meq/L (ref 96–112)
CO2: 26 mEq/L (ref 19–32)
Calcium: 8.7 mg/dL (ref 8.4–10.5)
Creatinine, Ser: 0.86 mg/dL (ref 0.50–1.10)
GFR calc non Af Amer: 67 mL/min — ABNORMAL LOW (ref >90)
GFR, EST AFRICAN AMERICAN: 78 mL/min — AB (ref >90)
Glucose, Bld: 98 mg/dL (ref 70–99)
POTASSIUM: 3.5 meq/L — AB (ref 3.7–5.3)
Sodium: 140 mEq/L (ref 137–147)

## 2013-03-26 MED ORDER — METRONIDAZOLE 500 MG PO TABS: 500.0000 mg | ORAL_TABLET | Freq: Three times a day (TID) | ORAL | Status: DC

## 2013-03-26 MED ORDER — OSELTAMIVIR PHOSPHATE 30 MG PO CAPS: 30.0000 mg | ORAL_CAPSULE | Freq: Two times a day (BID) | ORAL | Status: DC

## 2013-03-26 MED ORDER — CIPROFLOXACIN HCL 500 MG PO TABS: 500.0000 mg | ORAL_TABLET | Freq: Two times a day (BID) | ORAL | Status: DC

## 2013-03-26 MED ORDER — POTASSIUM CHLORIDE CRYS ER 20 MEQ PO TBCR: 20.0000 meq | EXTENDED_RELEASE_TABLET | Freq: Two times a day (BID) | ORAL | Status: DC

## 2013-03-26 NOTE — Discharge Summary (Signed)
Physician Discharge Summary  Rhonda Murphy AVW:[id number] DOB: [birth year] DOA: 03/21/2013  PCP: Romeo Rabon, MD  Admit date: 03/21/2013 Discharge date: 03/26/2013  Time spent: Greater than 30 minutes  Recommendations for Outpatient Follow-up:  1. The patient will need her hemoglobin/hematocrit and WBC be checked at her followup appointment.  Discharge Diagnoses:  1. Small bowel obstruction and/or enteritis. 2. Influenza A infection. 3. Nausea, vomiting, and abdominal pain secondary to #1. Resolved. 4. History of C. difficile colitis. Not present during this hospitalization. 5.. Left bundle branch block, likely chronic. Troponin I negative. 6. Leukopenia, possibly secondary to hemodilution, viral syndrome and possibly antibiotics. White blood cell count 3.2 at the time of discharge. 7. Mild anemia, possibly dilutional. Hemoglobin 11.6 at the time of discharge. 7. Hypertension. Remained stable.  Discharge Condition: Improved.  Diet recommendation: Heart healthy.  Filed Weights   03/21/13 1245 03/21/13 2056  Weight: 72.122 kg (159 lb) 70 kg (154 lb 5.2 oz)    History of present illness:   Rhonda Murphy is a 68 y.o. female who presente to the emergency room with complaints of abdominal pain and vomiting. Patient was in her usual state of health when she developed generalized abdominal pain. The patient attempted to move her bowels but wasn't successful. Her last bowel movement was the day before admission and was normal. The patient reported that she does chronically deal with constipation. She subsequently developed multiple episodes of vomiting, which prompted her evaluation in the emergency room. She denied any fever, diarrhea, hematemesis, melena, or hematochezia. She had not had any sick contacts. She reported recently being on antibiotics. Denied any chest pain or shortness of breath. In the emergency department, she underwent evaluation with an abdominal CT scan which indicated  enteritis versus possible bowel obstruction. The patient was admitted for further treatment. Of note, she does have a significant past surgical history of hysterectomy, appendectomy, and cholecystectomy.   Hospital Course:   The patient was made to be virtually n.p.o. for bowel rest. IV fluids were started for hydration. Zofran was ordered as needed for nausea and vomiting. Her pain was treated with as needed analgesics. Cipro and Flagyl were started empirically for treatment of enteritis. Most of her oral medications were withheld until she was able to tolerate them. Once she was able to tolerate them, her antihypertensive medications were restarted. Her vomiting resolved and her nausea subsided. She began to have "normal" bowel movements. Her diet was advanced to clear liquids and then subsequently to solid foods. She tolerated the advancement well. However, she developed a low-grade fever. Given her history of C. difficile colitis, C. difficile PCR was ordered. It was negative. Influenza A PCR was ordered. It was positive. Tamiflu was started following the positivity.  The patient was found to have a left bundle branch block. It was uncertain if this finding was a new or an old finding. She reported a negative nuclear medicine stress test approximately 5 or 6 years ago. Nevertheless, cardiac enzymes and a 2-D echocardiogram were ordered. Her troponin I was negative x4. Her echocardiogram revealed an ejection fraction of 50-55% and grade 1 diastolic dysfunction. It also revealed ventricular septum dyssynergy, consistent with left bundle branch block. She had no complaints of chest pain during the hospitalization. Further management and/or monitoring will be deferred to her primary care physician.  The patient developed leukopenia. Her white blood cell count was within normal limits at 7.6 on admission. However it decreased to a nadir of 2.7 before it began to  increase to 3.2 prior to discharge. The  etiology could be multifactorial including hemodilution from IV fluids, reversible bone marrow suppression from Flagyl and Cipro, or from acquired influenza A infection. Followup CBC is recommended in the outpatient setting.  The patient was repleted with potassium chloride for borderline hypokalemia. She was continued on potassium chloride supplementation following discharge.  The patient was afebrile and hemodynamically stable at the time of discharge. She was asymptomatic. She had no complaints of abdominal pain, nausea, vomiting, subjective fever chills, or cough. She was discharged on the completion of therapy with Tamiflu. She was also discharged on several more days of Cipro and Flagyl.   Procedures:  2-D echo: Study Conclusions - Left ventricle: The cavity size was normal. Wall thickness was increased in a pattern of mild LVH. Systolic function was low normal. The estimated ejection fraction was in the range of 50% to 55%. Doppler parameters are consistent with abnormal left ventricular relaxation (grade 1 diastolic dysfunction). - Ventricular septum: Septal motion showed abnormal function and dyssynergy. These changes are consistent with a left bundle branch block. - Aortic valve: Mildly calcified annulus. Trileaflet; mildly thickened leaflets. Transvalvular velocity was within the normal range. There was no stenosis. No regurgitation. - Mitral valve: Mild regurgitation. - Left atrium: The atrium was moderately dilated. - Right ventricle: The cavity size was mildly dilated. Systolic function was mildly reduced. - Right atrium: The atrium was mildly dilated. - Tricuspid valve: Mild regurgitation. Transthoracic echocardiography. M-mode, complete 2D, spectral Doppler, and color Doppler. Height: Height: 160cm. Height: 63in. Weight: Weight: 69.9kg. Weight: 153.7lb. Body mass index: BMI: 27.3kg/m^2. Body surface area: BSA: 1.5714m^2. Blood pressure: 160/66. Patient status:  Inpatient. Location: Bedside.   Consultations:  None  Discharge Exam: Filed Vitals:   03/26/13 0700  BP: 150/66  Pulse: 65  Temp: 98.6 F (37 C)  Resp: 17    General: Pleasant 68 year old in no acute distress. Cardiovascular: S1, S2, with a soft systolic murmur. Respiratory: Clear to auscultation bilaterally. Abdomen: Positive bowel sounds, soft, nontender, nondistended.  Discharge Instructions  Discharge Orders   Future Orders Complete By Expires   Diet - low sodium heart healthy  As directed    Increase activity slowly  As directed        Medication List         amitriptyline 25 MG tablet  Commonly known as:  ELAVIL  Take 25 mg by mouth at bedtime.     amLODipine 5 MG tablet  Commonly known as:  NORVASC  Take 5 mg by mouth daily.     BYSTOLIC 20 MG Tabs  Generic drug:  Nebivolol HCl  Take 20 mg by mouth daily.     CENTRUM SILVER tablet  Take 1 tablet by mouth daily.     ciprofloxacin 500 MG tablet  Commonly known as:  CIPRO  Take 1 tablet (500 mg total) by mouth 2 (two) times daily. Take for 3 more days.     EPIPEN 0.3 mg/0.3 mL Soaj injection  Generic drug:  EPINEPHrine  Inject 0.3 mg into the muscle once as needed. Allergic reaction     fluticasone 50 MCG/ACT nasal spray  Commonly known as:  FLONASE  Place 1 spray into both nostrils daily.     hydrochlorothiazide 12.5 MG capsule  Commonly known as:  MICROZIDE  Take 12.5 mg by mouth daily.     losartan 100 MG tablet  Commonly known as:  COZAAR  Take 100 mg by mouth daily.  metroNIDAZOLE 500 MG tablet  Commonly known as:  FLAGYL  Take 1 tablet (500 mg total) by mouth every 8 (eight) hours. Take for 3 more days.     nitroGLYCERIN 0.4 MG SL tablet  Commonly known as:  NITROSTAT  Place 0.4 mg under the tongue every 5 (five) minutes as needed for chest pain.     oseltamivir 30 MG capsule  Commonly known as:  TAMIFLU  Take 1 capsule (30 mg total) by mouth 2 (two) times daily. Take for  3-1/2 more days.     potassium chloride SA 20 MEQ tablet  Commonly known as:  K-DUR,KLOR-CON  Take 1 tablet (20 mEq total) by mouth 2 (two) times daily.     ranitidine 150 MG tablet  Commonly known as:  ZANTAC  Take 150 mg by mouth 2 (two) times daily.     simvastatin 20 MG tablet  Commonly known as:  ZOCOR  Take 20 mg by mouth at bedtime.       Allergies  Allergen Reactions  . Influenza Vaccines Rash  . Sulfa Antibiotics Anaphylaxis    Rash all over, tongue swells per patient  . Esomeprazole Magnesium   . Omeprazole   . Pantoprazole Sodium   . Rabeprazole Sodium   . Sulfonamide Derivatives       The results of significant diagnostics from this hospitalization (including imaging, microbiology, ancillary and laboratory) are listed below for reference.    Significant Diagnostic Studies: Ct Abdomen Pelvis W Contrast  03/21/2013   CLINICAL DATA:  Abdominal pain radiating to chest, hypertension, asthma, past history of C difficile colitis  EXAM: CT ABDOMEN AND PELVIS WITH CONTRAST  TECHNIQUE: Multidetector CT imaging of the abdomen and pelvis was performed using the standard protocol following bolus administration of intravenous contrast. Sagittal and coronal MPR images reconstructed from axial data set.  CONTRAST:  50mL OMNIPAQUE IOHEXOL 300 MG/ML SOLN orally, OMNIPAQUE IOHEXOL 300 MG/ML SOLN  COMPARISON:  None.  FINDINGS: Minimal atelectasis at lung bases. Mild intrahepatic and extrahepatic biliary dilatation post cholecystectomy.  Small exophytic cyst laterally mid to upper left kidney 16 x 14 mm image 31.  Additional tiny cyst upper pole right kidney.  Liver, spleen, pancreas, kidneys, and adrenal glands normal appearance.  Small bowel loop in right mid abdomen demonstrates wall thickening and associated mesenteric edema compatible with enteritis.  Proximal small bowel loops are slightly greater in diameters than distal.  Small amount of free intraperitoneal fluid is seen  perihepatic, interloop, and in the pelvis.  No definite evidence of bowel obstruction or perforation.  Tiny hiatal hernia.  Colon unremarkable.  Appendix not visualized.  No urinary tract calcification or dilatation.  Mild sigmoid diverticulosis without evidence of diverticulitis.  Uterus surgically absent with normal size of left ovary.  Right ovary and appendix not definitely visualized.  No additional mass, adenopathy, free air or hernia.  Bones appear demineralized with scattered degenerative disc disease changes greatest at L5-S1.  IMPRESSION: Abnormal loop of small bowel in the right mid abdomen demonstrating wall thickening and associated mesenteric edema, favor focal enteritis though adhesion with obstruction is not completely excluded.  Small amount of associated free intraperitoneal fluid.  Sigmoid diverticulosis without evidence of diverticulitis.   Electronically Signed   By: Ulyses Southward M.D.   On: 03/21/2013 16:02   Dg Abd Acute W/chest  03/23/2013   CLINICAL DATA:  Cough, fever, vomiting  EXAM: ACUTE ABDOMEN SERIES (ABDOMEN 2 VIEW & CHEST 1 VIEW)  COMPARISON:  CT ABD -  PELV W/ CM dated 03/21/2013  FINDINGS: The chest is clear. The heart and mediastinal contours are unremarkable. The osseous structures are unremarkable.  There is mild gaseous distention of the small bowel. There are a few small bowel air-fluid levels primarily in the right mid abdomen. There is no evidence of pneumoperitoneum, portal venous gas, or pneumatosis.  Osseous structures are unremarkable.  IMPRESSION: There are a few small bowel air-fluid levels primarily in the right mid abdomen at the site of an abnormal bowel loop seen on the CT performed 03/21/2013. This may reflect an ileus versus partial small bowel obstruction.   Electronically Signed   By: Elige Ko   On: 03/23/2013 19:36   Dg Abd Acute W/chest  03/21/2013   CLINICAL DATA:  Abdominal pain radiates to chest.  EXAM: ACUTE ABDOMEN SERIES (ABDOMEN 2 VIEW & CHEST 1  VIEW)  COMPARISON:  No comparison chest x-ray. Comparison CT abdomen pelvis 12/07/2009.  FINDINGS: Mild cardiomegaly.  Central pulmonary vascular prominence.  Basilar subsegmental atelectasis. No segmental consolidation, pulmonary edema or gross pneumothorax.  Calcified tortuous aorta.  Paucity of bowel gas. No plain film evidence of bowel obstruction or free intraperitoneal air.  IMPRESSION: Paucity of bowel gas. No plain film evidence of bowel obstruction or free intraperitoneal air.  Mild cardiomegaly.  Central pulmonary vascular prominence.  Calcified tortuous aorta.   Electronically Signed   By: Bridgett Larsson M.D.   On: 03/21/2013 14:00    Microbiology: Recent Results (from the past 240 hour(s))  CLOSTRIDIUM DIFFICILE BY PCR     Status: None   Collection Time    03/23/13  6:44 PM      Result Value Range Status   C difficile by pcr NEGATIVE  NEGATIVE Final     Labs: Basic Metabolic Panel:  Recent Labs Lab 03/21/13 1325 03/22/13 0711 03/24/13 0621 03/25/13 0608 03/26/13 0611  NA 134* 135* 135* 139 140  K 3.6* 3.6* 3.7 3.6* 3.5*  CL 94* 97 101 103 102  CO2 23 26 23 24 26   GLUCOSE 166* 107* 101* 99 98  BUN 23 17 9 7 7   CREATININE 0.99 0.97 0.96 0.86 0.86  CALCIUM 9.5 8.6 8.1* 8.1* 8.7   Liver Function Tests:  Recent Labs Lab 03/21/13 1326  AST 27  ALT 22  ALKPHOS 68  BILITOT 0.3  PROT 6.9  ALBUMIN 3.9    Recent Labs Lab 03/21/13 1326  LIPASE 34   No results found for this basename: AMMONIA,  in the last 168 hours CBC:  Recent Labs Lab 03/21/13 1326 03/24/13 0621 03/25/13 0608 03/26/13 0611  WBC 7.6 2.8* 2.7* 3.2*  NEUTROABS 6.0  --  1.1* 0.9*  HGB 12.3 10.7* 11.1* 11.6*  HCT 37.0 32.5* 33.4* 35.5*  MCV 91.6 93.7 92.8 91.7  PLT 288 191 196 217   Cardiac Enzymes:  Recent Labs Lab 03/21/13 1326 03/21/13 1524 03/21/13 2012 03/22/13 0126 03/22/13 0711  TROPONINI <0.30 <0.30 <0.30 <0.30 <0.30   BNP: BNP (last 3 results) No results found for this  basename: PROBNP,  in the last 8760 hours CBG: No results found for this basename: GLUCAP,  in the last 168 hours     Signed:  Derrill Bagnell  [facility] 03/26/2013, 5:38 PM

## 2013-03-26 NOTE — Progress Notes (Signed)
Pt a/o.vss. Up ad lib. No complaints of any distress. Discharge instructions given. Prescriptions given. Pt verbalized understanding of instructions. Awaiting for family to arrive for discharge.

## 2014-05-11 ENCOUNTER — Ambulatory Visit: Payer: Self-pay | Admitting: Orthopedic Surgery

## 2014-07-04 ENCOUNTER — Ambulatory Visit
Admit: 2014-07-04 | Disposition: A | Discharge status: Home / Self Care | Payer: Self-pay | Admitting: Orthopedic Surgery

## 2014-07-04 LAB — APTT: ACTIVATED PTT: 35.4 s (ref 23.6–35.9)

## 2014-07-04 LAB — CBC
HCT: 37.9 % (ref 35.0–47.0)
HGB: 12.6 g/dL (ref 12.0–16.0)
MCH: 30 pg (ref 26.0–34.0)
MCHC: 33.2 g/dL (ref 32.0–36.0)
MCV: 90 fL (ref 80–100)
Platelet: 274 10*3/uL (ref 150–440)
RBC: 4.19 10*6/uL (ref 3.80–5.20)
RDW: 13.6 % (ref 11.5–14.5)
WBC: 8.7 10*3/uL (ref 3.6–11.0)

## 2014-07-04 LAB — BASIC METABOLIC PANEL
ANION GAP: 10 (ref 7–16)
BUN: 24 mg/dL — ABNORMAL HIGH
CALCIUM: 9.2 mg/dL
CHLORIDE: 103 mmol/L
CO2: 27 mmol/L
Creatinine: 1.12 mg/dL — ABNORMAL HIGH
EGFR (Non-African Amer.): 49 — ABNORMAL LOW
GFR CALC AF AMER: 57 — AB
Glucose: 90 mg/dL
Potassium: 3.7 mmol/L
Sodium: 140 mmol/L

## 2014-07-04 LAB — URINALYSIS, COMPLETE
BLOOD: NEGATIVE
Bacteria: NONE SEEN
Bilirubin,UR: NEGATIVE
Glucose,UR: NEGATIVE mg/dL (ref 0–75)
Ketone: NEGATIVE
LEUKOCYTE ESTERASE: NEGATIVE
Nitrite: NEGATIVE
Ph: 6 (ref 4.5–8.0)
Protein: NEGATIVE
Specific Gravity: 1.009 (ref 1.003–1.030)

## 2014-07-04 LAB — PROTIME-INR
INR: 1
PROTHROMBIN TIME: 13.2 s

## 2014-07-04 LAB — SEDIMENTATION RATE: Erythrocyte Sed Rate: 26 mm/hr (ref 0–30)

## 2014-07-04 LAB — MRSA PCR SCREENING

## 2014-07-12 ENCOUNTER — Other Ambulatory Visit: Payer: Medicare Other

## 2014-07-12 ENCOUNTER — Inpatient Hospital Stay
Admission: AD | Admit: 2014-07-12 | Discharge: 2014-07-15 | DRG: 470 | Disposition: A | Discharge status: Skilled Nursing Facility | Payer: Medicare Other | Source: Ambulatory Visit | Attending: Orthopedic Surgery | Admitting: Orthopedic Surgery

## 2014-07-12 DIAGNOSIS — M1711 Unilateral primary osteoarthritis, right knee: Principal | ICD-10-CM | POA: Diagnosis present

## 2014-07-12 DIAGNOSIS — E780 Pure hypercholesterolemia: Secondary | ICD-10-CM | POA: Diagnosis present

## 2014-07-12 DIAGNOSIS — D62 Acute posthemorrhagic anemia: Secondary | ICD-10-CM | POA: Diagnosis not present

## 2014-07-12 DIAGNOSIS — Z79899 Other long term (current) drug therapy: Secondary | ICD-10-CM | POA: Diagnosis not present

## 2014-07-12 DIAGNOSIS — M549 Dorsalgia, unspecified: Secondary | ICD-10-CM | POA: Diagnosis present

## 2014-07-12 DIAGNOSIS — Z90710 Acquired absence of both cervix and uterus: Secondary | ICD-10-CM

## 2014-07-12 DIAGNOSIS — K219 Gastro-esophageal reflux disease without esophagitis: Secondary | ICD-10-CM | POA: Diagnosis present

## 2014-07-12 DIAGNOSIS — Z887 Allergy status to serum and vaccine status: Secondary | ICD-10-CM | POA: Diagnosis not present

## 2014-07-12 DIAGNOSIS — R609 Edema, unspecified: Secondary | ICD-10-CM | POA: Diagnosis present

## 2014-07-12 DIAGNOSIS — Z882 Allergy status to sulfonamides status: Secondary | ICD-10-CM

## 2014-07-12 DIAGNOSIS — Z791 Long term (current) use of non-steroidal anti-inflammatories (NSAID): Secondary | ICD-10-CM

## 2014-07-12 DIAGNOSIS — J45909 Unspecified asthma, uncomplicated: Secondary | ICD-10-CM | POA: Diagnosis present

## 2014-07-12 DIAGNOSIS — E871 Hypo-osmolality and hyponatremia: Secondary | ICD-10-CM | POA: Diagnosis not present

## 2014-07-12 DIAGNOSIS — E876 Hypokalemia: Secondary | ICD-10-CM | POA: Diagnosis not present

## 2014-07-12 DIAGNOSIS — Z9079 Acquired absence of other genital organ(s): Secondary | ICD-10-CM | POA: Diagnosis present

## 2014-07-12 DIAGNOSIS — Z881 Allergy status to other antibiotic agents status: Secondary | ICD-10-CM

## 2014-07-12 DIAGNOSIS — M1710 Unilateral primary osteoarthritis, unspecified knee: Secondary | ICD-10-CM | POA: Diagnosis present

## 2014-07-12 DIAGNOSIS — Z888 Allergy status to other drugs, medicaments and biological substances status: Secondary | ICD-10-CM | POA: Diagnosis not present

## 2014-07-12 DIAGNOSIS — I10 Essential (primary) hypertension: Secondary | ICD-10-CM | POA: Diagnosis present

## 2014-07-12 DIAGNOSIS — M1991 Primary osteoarthritis, unspecified site: Secondary | ICD-10-CM | POA: Diagnosis present

## 2014-07-13 LAB — BASIC METABOLIC PANEL
Anion Gap: 9 (ref 7–16)
BUN: 15 mg/dL
CREATININE: 0.69 mg/dL
Calcium, Total: 8.3 mg/dL — ABNORMAL LOW
Chloride: 101 mmol/L
Co2: 26 mmol/L
EGFR (Non-African Amer.): >60
Glucose: 109 mg/dL — ABNORMAL HIGH
Potassium: 2.9 mmol/L — ABNORMAL LOW
Sodium: 136 mmol/L

## 2014-07-13 LAB — PLATELET COUNT: PLATELETS: 245 10*3/uL (ref 150–440)

## 2014-07-13 LAB — HEMOGLOBIN: HGB: 11.3 g/dL — ABNORMAL LOW (ref 12.0–16.0)

## 2014-07-14 DIAGNOSIS — M1710 Unilateral primary osteoarthritis, unspecified knee: Secondary | ICD-10-CM | POA: Diagnosis present

## 2014-07-14 LAB — BASIC METABOLIC PANEL
ANION GAP: 7 (ref 7–16)
BUN: 10 mg/dL
CALCIUM: 8.4 mg/dL — AB
Chloride: 92 mmol/L — ABNORMAL LOW
Co2: 29 mmol/L
Creatinine: 0.64 mg/dL
EGFR (African American): >60
Glucose: 126 mg/dL — ABNORMAL HIGH
Potassium: 3.2 mmol/L — ABNORMAL LOW
Sodium: 128 mmol/L — ABNORMAL LOW

## 2014-07-14 MED ORDER — ONDANSETRON HCL 4 MG/2ML IJ SOLN: 4.0000 mg | INTRAMUSCULAR | Status: DC | PRN

## 2014-07-14 MED ORDER — PROMETHAZINE HCL 25 MG/ML IJ SOLN: 25.0000 mg | INTRAMUSCULAR | Status: DC | PRN

## 2014-07-14 MED ORDER — DOLASETRON MESYLATE 20 MG/ML IV SOLN
12.5000 mg | Freq: Four times a day (QID) | INTRAVENOUS | Status: DC | PRN
Start: 2014-07-15 — End: 2014-07-14

## 2014-07-14 MED ORDER — SODIUM CHLORIDE 0.9 % IJ SOLN: 3.0000 mL | INTRAMUSCULAR | Status: DC | PRN

## 2014-07-14 MED ORDER — HYDROXYCHLOROQUINE SULFATE 200 MG PO TABS
200.0000 mg | ORAL_TABLET | Freq: Two times a day (BID) | ORAL | Status: DC
  Administered 2014-07-15: 200 mg via ORAL
  Filled 2014-07-14: qty 1

## 2014-07-14 MED ORDER — ADULT MULTIVITAMIN W/MINERALS CH
1.0000 | ORAL_TABLET | Freq: Every day | ORAL | Status: DC
  Administered 2014-07-15: 1 via ORAL
  Filled 2014-07-14: qty 1

## 2014-07-14 MED ORDER — MAGNESIUM HYDROXIDE 400 MG/5ML PO SUSP: 30.0000 mL | Freq: Two times a day (BID) | ORAL | Status: DC | PRN

## 2014-07-14 MED ORDER — CETIRIZINE HCL 10 MG PO TABS
10.0000 mg | ORAL_TABLET | Freq: Every day | ORAL | Status: DC
  Administered 2014-07-15: 10 mg via ORAL
  Filled 2014-07-14: qty 1

## 2014-07-14 MED ORDER — NITROGLYCERIN 0.4 MG SL SUBL: 0.4000 mg | SUBLINGUAL_TABLET | SUBLINGUAL | Status: DC | PRN

## 2014-07-14 MED ORDER — AMITRIPTYLINE HCL 25 MG PO TABS: 25.0000 mg | ORAL_TABLET | Freq: Every day | ORAL | Status: DC

## 2014-07-14 MED ORDER — SODIUM CHLORIDE 0.9 % IJ SOLN
3.0000 mL | Freq: Four times a day (QID) | INTRAMUSCULAR | Status: DC
  Administered 2014-07-15: 3 mL via INTRAVENOUS

## 2014-07-14 MED ORDER — ACETAMINOPHEN 325 MG PO TABS: 325.0000 mg | ORAL_TABLET | ORAL | Status: DC | PRN

## 2014-07-14 MED ORDER — MONTELUKAST SODIUM 10 MG PO TABS: 10.0000 mg | ORAL_TABLET | Freq: Every day | ORAL | Status: DC

## 2014-07-14 MED ORDER — OXYCODONE HCL 5 MG PO TABS: 5.0000 mg | ORAL_TABLET | ORAL | Status: DC | PRN

## 2014-07-14 MED ORDER — TRAMADOL HCL 50 MG PO TABS
50.0000 mg | ORAL_TABLET | Freq: Four times a day (QID) | ORAL | Status: DC | PRN
  Administered 2014-07-15: 100 mg via ORAL
  Filled 2014-07-14: qty 2

## 2014-07-14 MED ORDER — HYDROCHLOROTHIAZIDE 12.5 MG PO CAPS
12.5000 mg | ORAL_CAPSULE | Freq: Every day | ORAL | Status: DC
  Filled 2014-07-14: qty 1

## 2014-07-14 MED ORDER — BISACODYL 10 MG RE SUPP: 10.0000 mg | Freq: Every day | RECTAL | Status: DC | PRN

## 2014-07-14 MED ORDER — ACETAMINOPHEN 500 MG PO TABS: 500.0000 mg | ORAL_TABLET | ORAL | Status: DC | PRN

## 2014-07-14 MED ORDER — ENOXAPARIN SODIUM 30 MG/0.3ML ~~LOC~~ SOLN: 30.0000 mg | Freq: Two times a day (BID) | SUBCUTANEOUS | Status: DC

## 2014-07-14 MED ORDER — MORPHINE SULFATE 2 MG/ML IJ SOLN: 2.0000 mg | INTRAMUSCULAR | Status: DC | PRN

## 2014-07-14 MED ORDER — ENOXAPARIN SODIUM 30 MG/0.3ML ~~LOC~~ SOLN
30.0000 mg | Freq: Two times a day (BID) | SUBCUTANEOUS | Status: DC
  Administered 2014-07-15: 30 mg via SUBCUTANEOUS
  Filled 2014-07-14: qty 0.3

## 2014-07-14 MED ORDER — LOSARTAN POTASSIUM 50 MG PO TABS
100.0000 mg | ORAL_TABLET | Freq: Every day | ORAL | Status: DC
  Administered 2014-07-15: 100 mg via ORAL
  Filled 2014-07-14: qty 2

## 2014-07-14 MED ORDER — POTASSIUM CHLORIDE 20 MEQ PO PACK
20.0000 meq | PACK | Freq: Three times a day (TID) | ORAL | Status: DC
  Filled 2014-07-14: qty 1

## 2014-07-14 MED ORDER — ALUM & MAG HYDROXIDE-SIMETH 200-200-20 MG/5ML PO SUSP: 30.0000 mL | Freq: Four times a day (QID) | ORAL | Status: DC | PRN

## 2014-07-14 MED ORDER — SENNOSIDES-DOCUSATE SODIUM 8.6-50 MG PO TABS
1.0000 | ORAL_TABLET | Freq: Two times a day (BID) | ORAL | Status: DC
  Administered 2014-07-15: 1 via ORAL
  Filled 2014-07-14: qty 1

## 2014-07-14 MED ORDER — ONDANSETRON 4 MG PO TBDP: 8.0000 mg | ORAL_TABLET | Freq: Four times a day (QID) | ORAL | Status: DC | PRN

## 2014-07-14 MED ORDER — FAMOTIDINE 20 MG PO TABS
20.0000 mg | ORAL_TABLET | Freq: Two times a day (BID) | ORAL | Status: DC
  Administered 2014-07-15: 20 mg via ORAL
  Filled 2014-07-14: qty 1

## 2014-07-14 MED ORDER — AMLODIPINE BESYLATE 5 MG PO TABS
5.0000 mg | ORAL_TABLET | Freq: Every day | ORAL | Status: DC
  Administered 2014-07-15: 5 mg via ORAL
  Filled 2014-07-14: qty 1

## 2014-07-14 MED ORDER — ZOLPIDEM TARTRATE 5 MG PO TABS: 5.0000 mg | ORAL_TABLET | Freq: Every evening | ORAL | Status: DC | PRN

## 2014-07-14 MED ORDER — SIMVASTATIN 40 MG PO TABS: 40.0000 mg | ORAL_TABLET | Freq: Every day | ORAL | Status: DC

## 2014-07-15 LAB — BASIC METABOLIC PANEL
Anion gap: 8 (ref 5–15)
BUN: 11 mg/dL (ref 6–20)
CO2: 29 mmol/L (ref 22–32)
CREATININE: 0.7 mg/dL (ref 0.44–1.00)
Calcium: 8.1 mg/dL — ABNORMAL LOW (ref 8.9–10.3)
Chloride: 93 mmol/L — ABNORMAL LOW (ref 101–111)
Glucose, Bld: 122 mg/dL — ABNORMAL HIGH (ref 65–99)
POTASSIUM: 3.8 mmol/L (ref 3.5–5.1)
Sodium: 130 mmol/L — ABNORMAL LOW (ref 135–145)

## 2014-07-15 MED ORDER — ADULT MULTIVITAMIN W/MINERALS CH: 1.0000 | ORAL_TABLET | Freq: Every day | ORAL | Status: DC

## 2014-07-15 MED ORDER — TRAMADOL HCL 50 MG PO TABS: 50.0000 mg | ORAL_TABLET | Freq: Four times a day (QID) | ORAL | Status: DC | PRN

## 2014-07-15 MED ORDER — BISACODYL 10 MG RE SUPP: 10.0000 mg | Freq: Every day | RECTAL | Status: DC | PRN

## 2014-07-15 MED ORDER — ALUM & MAG HYDROXIDE-SIMETH 200-200-20 MG/5ML PO SUSP: 30.0000 mL | Freq: Four times a day (QID) | ORAL | Status: DC | PRN

## 2014-07-15 MED ORDER — OXYCODONE HCL 5 MG PO TABS: 5.0000 mg | ORAL_TABLET | ORAL | Status: DC | PRN

## 2014-07-15 MED ORDER — ENOXAPARIN SODIUM 30 MG/0.3ML ~~LOC~~ SOLN: 30.0000 mg | Freq: Two times a day (BID) | SUBCUTANEOUS | Status: DC

## 2014-07-15 MED ORDER — SENNOSIDES-DOCUSATE SODIUM 8.6-50 MG PO TABS: 1.0000 | ORAL_TABLET | Freq: Two times a day (BID) | ORAL | Status: DC

## 2014-07-15 MED ORDER — MAGNESIUM HYDROXIDE 400 MG/5ML PO SUSP: 30.0000 mL | Freq: Two times a day (BID) | ORAL | Status: DC | PRN

## 2014-07-15 MED ORDER — HYDROXYCHLOROQUINE SULFATE 200 MG PO TABS: 200.0000 mg | ORAL_TABLET | Freq: Two times a day (BID) | ORAL | Status: DC

## 2014-07-15 NOTE — Op Note (Signed)
PATIENT NAME:  Rhonda Murphy, [name] MR#:  [id number] DATE OF BIRTH:  1945-07-15  DATE OF PROCEDURE:  07/12/2014  PREOPERATIVE DIAGNOSIS: Severe right knee osteoarthritis.   POSTOPERATIVE DIAGNOSIS: Severe right knee osteoarthritis.   PROCEDURE: Right total knee replacement.   ANESTHESIA: Spinal.   SURGEON: Rhonda SchullerMichael J. Thaddius Manes, MD   ASSISTANT: Rhonda Caneshristine, PA-C   DESCRIPTION OF PROCEDURE: The patient was brought to the Operating Room and after adequate anesthesia was obtained, the right leg was prepped and draped in the usual sterile fashion with a tourniquet applied to the upper thigh. After patient identification and timeout procedures were completed, the tourniquet was raised to 300 mmHg after a skin incision and the arthrotomy performed. There was still oozing from the bone and so the tourniquet was let down and reinflated to 350 which gave good control of bleeding. Inspection of the knee revealed eburnated bone in the lateral compartment and patellofemoral joint with advanced degenerative changes to the medial compartment, but without eburnation. The anterior horns of the menisci were excised along with the fat pad, ACL and PCL. ACL was somewhat lax. The proximal tibia was exposed and the proximal tibia cut carried out, followed by the identical procedure on the distal femur. The distal femur 4 in 1 cut was carried out using the size 3 cutting guide followed by the notch cut for the trochlear groove. The proximal tibia temporary trial was placed, a size 2 that was pinned in the appropriate external rotation based on the cutting guide. Proximal drilling carried out for a short stem and a keel punch inserted. The femoral trial was placed and a 17 mm insert was needed to get good stability with carrying out a partial release of the superficial lateral collateral and IT band because of some valgus deformity. The trial components were removed and the patella was cut using the patellar cutting guide. After  measuring, it measured a size 2. At this point the tourniquet was let down, hemostasis was checked with electrocautery. The knee was infiltrated with a dilute mixture of Exparel, as well as Marcaine with morphine. The tourniquet was raised again with bony surfaces thoroughly irrigated and dried. The tibial component was cemented into place first, followed by the 17 mm trail and then the distal femur component. The 17 had a little bit too much laxity in extension so the 20 mm insert was placed and the knee was stable. The final component was placed at this point with a set screw. Next, the patella was clamped into place. Again, all components cemented. After the cement was set and excess cement had been removed, the patella was noted to track laterally as commonly seen with valgus knees and a lateral release was carried out. The arthrotomy was repaired using initially 0 Ethibond followed by a heavy quill suture, 2-0 quill subcutaneously and skin staples. Theo tourniquet was let down prior to wound closure and after thorough irrigation of the knee. Xeroform, 4 x 4's, ABD, Webril and Ace wrap were applied along with Polar Care, no knee immobilizer with the preoperative valgus present. There were no complications.   SPECIMENS: Cut ends of bone.   ESTIMATED BLOOD LOSS: 25 mL.   TOURNIQUET TIME: 68 minutes at 350 mmHg.   IMPLANTS: Medacta GMK Sphere primary femoral component, size 3 tibial component, size 2 11 mm extension stem on the tibia, a size 2 patella and a 20 mm tibial insert size 2.    ____________________________ Rhonda SchullerMichael J. Rhonda Whitebread, MD mjm:at D: 07/12/2014 20:46:15 ET  T: 07/13/2014 09:20:55 ET JOB#: [id number]  cc: Rhonda Schuller, MD, <Dictator> Rhonda Schuller MD ELECTRONICALLY SIGNED 07/13/2014 17:31

## 2014-07-15 NOTE — Progress Notes (Signed)
Pt for d/[facility] [place]. CSW confirmed with Zella BallRobin [phone] at [facility] that they are able to accept pt today and have all needed paperwork. Packet complete and RN to call report. Pt's husband at bedside and will provide transport. CSW signing off. Rhonda BurnsJosie Abdulrahman Murphy, MSW, LCSW [phone] (weekend coverage)

## 2014-07-15 NOTE — Progress Notes (Signed)
Patient prepared for discharge.  Dressing changed/clean/dry/intact.  Patient to leave with family and drive to up to [place] to [facility].

## 2014-07-15 NOTE — Discharge Instructions (Signed)

## 2014-07-15 NOTE — Progress Notes (Signed)
Physical Therapy Treatment Patient Details Name: Rhonda Murphy MRN: [id number] DOB: 03-10-1946 Today's Date: 07/15/2014    History of Present Illness  Patient is a 69 year old female s/p R TKA performed by Dr. Rosita KeaMenz on 12 July 2014.    PT Comments    Patient demonstrated improvements in alertness during today's PT session. Patient denied complaints of dizziness throughout duration of treatment. Patient is still demonstrating decreased confidence in R LE when moving from sit to stand, requiring moderate assistance due to increased forward lean with decreased balance. Patient showed improvements in ability to ambulate today, walking 30' with RW and minimal assistance. Requires verbal/tactile cues for improvements in gait pattern and use of assistive device. Patient will continue to benefit from further reinforcement of these concepts to improve function after surgery.   Follow Up Recommendations  SNF     Equipment Recommendations  Rolling walker with 5" wheels    Recommendations for Other Services       Precautions / Restrictions Precautions Precautions: Fall Restrictions Weight Bearing Restrictions: Yes RLE Weight Bearing: Weight bearing as tolerated    Mobility  Bed Mobility Overal bed mobility: Needs Assistance Bed Mobility: Supine to Sit;Sit to Supine     Supine to sit: Min assist Sit to supine: Modified independent (Device/Increase time)   General bed mobility comments: Patient utilizes bed rails and overhead trapeze to move from sit to supine. Is able to bridge through lower extremities but demonstrates decreased ROM and strength on R.  Transfers Overall transfer level: Needs assistance Equipment used: Rolling walker (2 wheeled) Transfers: Sit to/from Stand Sit to Stand: Mod assist         General transfer comment: Patient requires moderate-maximal assistance to move from sit to stand, utilizing increased time to bear weight through right lower extremity and  extend knee.  Ambulation/Gait Ambulation/Gait assistance: Min assist Ambulation Distance (Feet): 30 Feet Assistive device: Rolling walker (2 wheeled) Gait Pattern/deviations: Step-to pattern;Decreased stance time - right;Decreased weight shift to right   Gait velocity interpretation: Below normal speed for age/gender General Gait Details: Patient ambulates with antalgic gait pattern, demonstrating decreased stance time on R and shortened step lengths. Required verbal cues to not walk too close to front of RW and minimal assistance to stabilize RW.   Stairs            Wheelchair Mobility    Modified Rankin (Stroke Patients Only)       Balance                                    Cognition Arousal/Alertness: Awake/alert                          Exercises Total Joint Exercises Ankle Circles/Pumps: AROM;Both;20 reps;Supine Quad Sets: AROM;Both;20 reps;Supine Gluteal Sets: AROM;Both;20 reps;Supine Short Arc Quad: AAROM;Both;20 reps;Supine Heel Slides: AAROM;Both;20 reps;Supine Hip ABduction/ADduction: AAROM;Both;20 reps;Supine Straight Leg Raises: AAROM;Right;20 reps;Supine Goniometric ROM: 8-68 deg. in supine; 84 deg. flexion in sitting Marching in Standing: AROM;10 reps;Both    General Comments        Pertinent Vitals/Pain Pain Assessment: 0-10 Pain Score: 5  Pain Location: Right posterior knee Pain Intervention(s): Patient requesting pain meds-RN notified    Home Living                      Prior Function  PT Goals (current goals can now be found in the care plan section) Progress towards PT goals: Progressing toward goals    Frequency       PT Plan Current plan remains appropriate    Co-evaluation             End of Session Equipment Utilized During Treatment: Gait belt Activity Tolerance: Patient tolerated treatment well;Patient limited by fatigue;Patient limited by pain Patient left: in bed;with  call bell/phone within reach;with bed alarm set;with family/visitor present;with SCD's reapplied     Time: 0820-0900 PT Time Calculation (min) (ACUTE ONLY): 40 min  Charges:  $Gait Training: 8-22 mins $Therapeutic Exercise: 23-37 mins                    G Codes:      Neita Carp, PT, DPT 07/15/2014, 9:31 AM

## 2014-07-15 NOTE — Progress Notes (Signed)
   Subjective:     Patient reports pain as 5 on 0-10 scale.   Patient is well, and has had no acute complaints or problems We will start therapy today.  Plan is to go Skilled nursing facility after hospital stay. no nausea and no vomiting no cp or sob Objective: Vital signs in last 24 hours: Temp:  [98.7 F (37.1 C)-98.9 F (37.2 C)] 98.7 F (37.1 C) (05/01 0825) Pulse Rate:  [95-99] 99 (05/01 0825) Resp:  [16-20] 16 (05/01 0825) BP: (130-133)/(57) 130/57 mmHg (05/01 0825) SpO2:  [93 %-95 %] 95 % (05/01 0825) well approximated incision  Intake/Output from previous day: 04/30 0701 - 05/01 0700 In: -  Out: 500 [Urine:500] Intake/Output this shift:     Recent Labs  07/13/14 0458  HGB 11.3*    Recent Labs  07/13/14 0458  PLT 245    Recent Labs  07/14/14 0432 07/15/14 0350  NA  --  130*  K  --  3.8  CL  --  93*  CO2 29 29  BUN 10 11  CREATININE 0.64 0.70  GLUCOSE  --  122*  CALCIUM 8.4* 8.1*   No results for input(s): LABPT, INR in the last 72 hours.  EXAM General - Patient is Alert, Appropriate and Oriented Extremity - Neurologically intact Neurovascular intact Sensation intact distally Intact pulses distally Dorsiflexion/Plantar flexion intact Dressing - scant drainage Motor Function - intact, moving foot and toes well on exam.   No past medical history on file.  Assessment/Plan:     Active Problems:   Primary localized osteoarthrosis of the knee  Estimated body mass index is 31.08 kg/(m^2) as calculated from the following:   Height as of this encounter: 5\' 2"  (1.575 m).   Weight as of this encounter: 77.1 kg (169 lb 15.6 oz). Up with therapy Discharge to SNF  DVT Prophylaxis - Lovenox, Foot Pumps and TED hose Weight-Bearing as tolerated to right leg D/C O2 and Pulse OX and try on Room Air  Siena Poehler R. [facility]fe PA [facility] 07/15/2014, 8:41 AM

## 2014-07-15 NOTE — Discharge Summary (Signed)
Physician Discharge Summary  Patient ID: Rhonda Murphy MRN: [id number] DOB/AGE: [birth year] 69 y.o.  Admit date: 07/12/2014 Discharge date: 07/15/2014  Admission Diagnoses:   Patient Active Problem List   Diagnosis Date Noted  . Primary localized osteoarthrosis of the knee 07/14/2014    Discharge Diagnoses:  Active Problems:   Primary localized osteoarthrosis of the knee    According to her history there is no past medical history present.  Surgeries:  history and physical did not indicate any past medical surgeries. Patient was admitted for a right total knee arthroplasty.    Consultants (if any):   none   Discharged Condition: Improved  Hospital Course: Rhonda Murphy is an 69 y.o. female who was admitted 07/12/2014 with a diagnosis of degenerative arthrosis of the right knee and went to the operating room on 07/13/2014. A right total knee arthroplasty was performed without complications. Medata GMK Sphere primary femoral comoonent, size 3 tibial component. size 2  11mm extension stem on the tibia, a size 2 patella and a 20 mm tibial insert size 2  She was given perioperative antibiotics: Ancief 2 grams .  She was given sequential compression devices, early ambulation, Lovenox 30 mg subcutaneous every 12 hours  for DVT prophylaxis.  She benefited maximally from the hospital stay and there were no complications.    Recent vital signs:  Filed Vitals:   07/15/14 0825  BP: 130/57  Pulse: 99  Temp: 98.7 F (37.1 C)  Resp: 16    Recent laboratory studies:  Lab Results  Component Value Date   HGB 11.3* 07/13/2014   HGB 12.6 07/04/2014   Lab Results  Component Value Date   WBC 8.7 07/04/2014   PLT 245 07/13/2014   Lab Results  Component Value Date   INR 1.0 07/04/2014   Lab Results  Component Value Date   NA 130* 07/15/2014   K 3.8 07/15/2014   CL 93* 07/15/2014   CO2 29 07/15/2014   BUN 11 07/15/2014   CREATININE 0.70 07/15/2014   GLUCOSE 122*  07/15/2014    Discharge Medications:     Medication List    STOP taking these medications        diclofenac 75 MG EC tablet  Commonly known as:  VOLTAREN      TAKE these medications        acetaminophen 650 MG CR tablet  Commonly known as:  TYLENOL  Take 650 mg by mouth 2 (two) times daily as needed for pain. 2 tablets orally twice a day     alum & mag hydroxide-simeth 200-200-20 MG/5ML suspension  Commonly known as:  MAALOX/MYLANTA  Take 30 mLs by mouth every 6 (six) hours as needed for indigestion or heartburn.     amitriptyline 25 MG tablet  Commonly known as:  ELAVIL  Take 25 mg by mouth at bedtime.     amLODipine 5 MG tablet  Commonly known as:  NORVASC  Take 5 mg by mouth daily.     bisacodyl 10 MG suppository  Commonly known as:  DULCOLAX  Place 1 suppository (10 mg total) rectally daily as needed (constipation if no results with MOM.).     cetirizine 10 MG tablet  Commonly known as:  ZYRTEC  Take 10 mg by mouth daily. 1 tab orally  Once a day, As needed     enoxaparin 30 MG/0.3ML injection  Commonly known as:  LOVENOX  Inject 0.3 mLs (30 mg total) into the skin every 12 (twelve) hours.  Fluticasone Furoate-Vilanterol 100-25 MCG/INH Aepb  Inhale 1 puff into the lungs daily.     hydrochlorothiazide 12.5 MG capsule  Commonly known as:  MICROZIDE  Take 12.5 mg by mouth daily.     hydroxychloroquine 200 MG tablet  Commonly known as:  PLAQUENIL  Take 1 tablet (200 mg total) by mouth 2 (two) times daily.  Start taking on:  07/22/2014     losartan 100 MG tablet  Commonly known as:  COZAAR  Take 100 mg by mouth daily.     magnesium hydroxide 400 MG/5ML suspension  Commonly known as:  MILK OF MAGNESIA  Take 30 mLs by mouth 2 (two) times daily as needed (constipation).     montelukast 10 MG tablet  Commonly known as:  SINGULAIR  Take 10 mg by mouth every evening.     multivitamin with minerals Tabs tablet  Take 1 tablet by mouth daily.     nebivolol  10 MG tablet  Commonly known as:  BYSTOLIC  Take 10 mg by mouth daily.     nitroGLYCERIN 0.4 MG SL tablet  Commonly known as:  NITROSTAT  Place 0.4 mg under the tongue every 5 (five) minutes as needed for chest pain.     NON FORMULARY  Apply topically 2 (two) times daily as needed. Triamcinolone acetonide cream     oxyCODONE 5 MG immediate release tablet  Commonly known as:  Oxy IR/ROXICODONE  Take 1-2 tablets (5-10 mg total) by mouth every 4 (four) hours as needed for moderate pain.     ranitidine 150 MG capsule  Commonly known as:  ZANTAC  Take 150 mg by mouth 2 (two) times daily.     senna-docusate 8.6-50 MG per tablet  Commonly known as:  Senokot-S  Take 1 tablet by mouth 2 (two) times daily.     simvastatin 40 MG tablet  Commonly known as:  ZOCOR  Take 40 mg by mouth at bedtime.     traMADol 50 MG tablet  Commonly known as:  ULTRAM  Take 1-2 tablets (50-100 mg total) by mouth every 6 (six) hours as needed (FOR moderate pain (4-6/10)).        Diagnostic Studies: Dg Knee 1-2 Views Right  07/12/2014   CLINICAL DATA:  Status post right total knee joint replacement  EXAM: RIGHT KNEE - 1-2 VIEW  COMPARISON:  CT scan of the right knee of May 11, 2014  FINDINGS: AP and lateral portable views reveala total joint prosthesis. Positioning of the prosthetic components and the interface with the native bone is radiographically acceptable. There are surgical skin staples present.  IMPRESSION: The patient has undergone right total knee joint prosthesis placement without evidence of immediate postprocedure complication.   Electronically Signed   By: David  [place] M.D.   On: 07/12/2014 14:48    Disposition: patient was discharged to skilled nursing facility in improved stable condition       Discharge Instructions    Diet - low sodium heart healthy    Complete by:  As directed      Increase activity slowly    Complete by:  As directed            Follow-up Information     Follow up with MENZ,MICHAEL, MD. Call in 2 days.   Specialty:  Orthopedic Surgery   Why:  for appt. date or, If symptoms worsen   Contact information:   [address] [facility] [place] [address] [phone]  Signed: WOLFE,JON R. 07/15/2014, 9:47 AM

## 2014-08-07 LAB — SURGICAL PATHOLOGY

## 2014-08-24 ENCOUNTER — Other Ambulatory Visit: Payer: Self-pay | Admitting: Orthopedic Surgery

## 2014-08-24 DIAGNOSIS — Z96651 Presence of right artificial knee joint: Secondary | ICD-10-CM

## 2014-09-04 ENCOUNTER — Ambulatory Visit: Payer: No Typology Code available for payment source

## 2014-09-21 ENCOUNTER — Ambulatory Visit
Admission: RE | Admit: 2014-09-21 | Discharge: 2014-09-21 | Disposition: A | Discharge status: Home / Self Care | Payer: Medicare Other | Source: Ambulatory Visit | Attending: Orthopedic Surgery | Admitting: Orthopedic Surgery

## 2014-09-21 DIAGNOSIS — M1712 Unilateral primary osteoarthritis, left knee: Secondary | ICD-10-CM | POA: Insufficient documentation

## 2014-09-21 DIAGNOSIS — Z01818 Encounter for other preprocedural examination: Secondary | ICD-10-CM | POA: Diagnosis present

## 2014-09-21 DIAGNOSIS — Z96651 Presence of right artificial knee joint: Secondary | ICD-10-CM

## 2014-10-25 ENCOUNTER — Encounter
Admission: RE | Admit: 2014-10-25 | Discharge: 2014-10-25 | Disposition: A | Discharge status: Home / Self Care | Payer: Medicare Other | Source: Ambulatory Visit | Attending: Orthopedic Surgery | Admitting: Orthopedic Surgery

## 2014-10-25 DIAGNOSIS — Z01810 Encounter for preprocedural cardiovascular examination: Secondary | ICD-10-CM | POA: Insufficient documentation

## 2014-10-25 DIAGNOSIS — Z01812 Encounter for preprocedural laboratory examination: Secondary | ICD-10-CM | POA: Diagnosis present

## 2014-10-25 LAB — CBC
HCT: 37.3 % (ref 35.0–47.0)
Hemoglobin: 12.2 g/dL (ref 12.0–16.0)
MCH: 28.5 pg (ref 26.0–34.0)
MCHC: 32.6 g/dL (ref 32.0–36.0)
MCV: 87.3 fL (ref 80.0–100.0)
Platelets: 305 10*3/uL (ref 150–440)
RBC: 4.27 MIL/uL (ref 3.80–5.20)
RDW: 13.8 % (ref 11.5–14.5)
WBC: 7.5 10*3/uL (ref 3.6–11.0)

## 2014-10-25 LAB — URINALYSIS COMPLETE WITH MICROSCOPIC (ARMC ONLY)
BACTERIA UA: NONE SEEN
Bilirubin Urine: NEGATIVE
GLUCOSE, UA: NEGATIVE mg/dL
HGB URINE DIPSTICK: NEGATIVE
KETONES UR: NEGATIVE mg/dL
Nitrite: NEGATIVE
Protein, ur: NEGATIVE mg/dL
SPECIFIC GRAVITY, URINE: 1.014 (ref 1.005–1.030)
pH: 6 (ref 5.0–8.0)

## 2014-10-25 LAB — TYPE AND SCREEN
ABO/RH(D): A POS
Antibody Screen: NEGATIVE

## 2014-10-25 LAB — PROTIME-INR
INR: 0.89
PROTHROMBIN TIME: 12.3 s (ref 11.4–15.0)

## 2014-10-25 LAB — BASIC METABOLIC PANEL
ANION GAP: 8 (ref 5–15)
BUN: 25 mg/dL — AB (ref 6–20)
CHLORIDE: 102 mmol/L (ref 101–111)
CO2: 29 mmol/L (ref 22–32)
CREATININE: 1.26 mg/dL — AB (ref 0.44–1.00)
Calcium: 9 mg/dL (ref 8.9–10.3)
GFR, EST AFRICAN AMERICAN: 48 mL/min — AB (ref >60)
GFR, EST NON AFRICAN AMERICAN: 42 mL/min — AB (ref >60)
Glucose, Bld: 89 mg/dL (ref 65–99)
Potassium: 3.3 mmol/L — ABNORMAL LOW (ref 3.5–5.1)
Sodium: 139 mmol/L (ref 135–145)

## 2014-10-25 LAB — ABO/RH: ABO/RH(D): A POS

## 2014-10-25 LAB — APTT: aPTT: 35 seconds (ref 24–36)

## 2014-10-25 LAB — SEDIMENTATION RATE: SED RATE: 33 mm/h — AB (ref 0–30)

## 2014-10-25 LAB — SURGICAL PCR SCREEN
MRSA, PCR: NEGATIVE
STAPHYLOCOCCUS AUREUS: NEGATIVE

## 2014-10-25 NOTE — Pre-Procedure Instructions (Signed)
Potassium 3.3 [facility] at [facility] notified-called and faxed.

## 2014-10-25 NOTE — Patient Instructions (Addendum)
  Your procedure is scheduled on: 11/08/14 Thurs  Report to Day Surgery. To find out your arrival time please call [phone] between 1PM - 3PM on 11/07/14 Wed.  Remember: Instructions that are not followed completely may result in serious medical risk, up to and including death, or upon the discretion of your surgeon and anesthesiologist your surgery may need to be rescheduled.    _x___ 1. Do not eat food or drink liquids after midnight. No gum chewing or hard candies.     ____ 2. No Alcohol for 24 hours before or after surgery.   ____ 3. Bring all medications with you on the day of surgery if instructed.    _x__ 4. Notify your doctor if there is any change in your medical condition     (cold, fever, infections).     Do not wear jewelry, make-up, hairpins, clips or nail polish.  Do not wear lotions, powders, or perfumes. You may wear deodorant.  Do not shave 48 hours prior to surgery. Men may shave face and neck.  Do not bring valuables to the hospital.    [facility] is not responsible for any belongings or valuables.               Contacts, dentures or bridgework may not be worn into surgery.  Leave your suitcase in the car. After surgery it may be brought to your room.  For patients admitted to the hospital, discharge time is determined by your                treatment team.   Patients discharged the day of surgery will not be allowed to drive home.   Please read over the following fact sheets that you were given:   MRSA Information   ____ Take these medicines the morning of surgery with A SIP OF WATER:    1. amLODipine (NORVASC) 5 MG tablet  2. losartan (COZAAR) 100 MG tablet  3. nebivolol (BYSTOLIC) 10 MG tablet  4.ranitidine (ZANTAC) 150 MG tablet  5.  6.  ____ Fleet Enema (as directed)   _x___ Use CHG Soap as directed  _x___ Use inhalers on the day of surgery Breo  ____ Stop metformin 2 days prior to surgery    ____ Take 1/2 of usual insulin dose the night  before surgery and none on the morning of surgery.   ____ Stop Coumadin/Plavix/aspirin on   ____ Stop Anti-inflammatories on    ____ Stop supplements until after surgery.    ____ Bring C-Pap to the hospital.

## 2014-10-26 NOTE — Pre-Procedure Instructions (Signed)
EKG to anesthesia for review. 

## 2014-10-29 NOTE — OR Nursing (Signed)
Copies of EKG's sent to patient PCP for review as requested by anesthesia. Confirmation of receipt made via telephone,

## 2014-11-01 NOTE — OR Nursing (Signed)
CLEARED BY DR Oscar La

## 2014-11-08 ENCOUNTER — Ambulatory Visit: Payer: Medicare Other | Admitting: Anesthesiology

## 2014-11-08 ENCOUNTER — Inpatient Hospital Stay
Admission: RE | Admit: 2014-11-08 | Discharge: 2014-11-11 | DRG: 470 | Disposition: A | Discharge status: Skilled Nursing Facility | Payer: Medicare Other | Source: Ambulatory Visit | Attending: Orthopedic Surgery | Admitting: Orthopedic Surgery

## 2014-11-08 ENCOUNTER — Encounter
Admission: RE | Disposition: A | Discharge status: Skilled Nursing Facility | Payer: Self-pay | Source: Ambulatory Visit | Attending: Orthopedic Surgery

## 2014-11-08 ENCOUNTER — Encounter: Payer: Self-pay | Admitting: Anesthesiology

## 2014-11-08 ENCOUNTER — Ambulatory Visit: Payer: Self-pay | Admitting: Unknown Physician Specialty

## 2014-11-08 ENCOUNTER — Inpatient Hospital Stay: Payer: Medicare Other

## 2014-11-08 ENCOUNTER — Other Ambulatory Visit: Payer: Self-pay | Admitting: Unknown Physician Specialty

## 2014-11-08 DIAGNOSIS — I10 Essential (primary) hypertension: Secondary | ICD-10-CM | POA: Diagnosis present

## 2014-11-08 DIAGNOSIS — Z79899 Other long term (current) drug therapy: Secondary | ICD-10-CM

## 2014-11-08 DIAGNOSIS — K219 Gastro-esophageal reflux disease without esophagitis: Secondary | ICD-10-CM | POA: Diagnosis present

## 2014-11-08 DIAGNOSIS — Z96651 Presence of right artificial knee joint: Secondary | ICD-10-CM | POA: Diagnosis present

## 2014-11-08 DIAGNOSIS — J45909 Unspecified asthma, uncomplicated: Secondary | ICD-10-CM | POA: Diagnosis present

## 2014-11-08 DIAGNOSIS — E669 Obesity, unspecified: Secondary | ICD-10-CM | POA: Diagnosis present

## 2014-11-08 DIAGNOSIS — I341 Nonrheumatic mitral (valve) prolapse: Secondary | ICD-10-CM | POA: Diagnosis present

## 2014-11-08 DIAGNOSIS — M1710 Unilateral primary osteoarthritis, unspecified knee: Secondary | ICD-10-CM | POA: Diagnosis present

## 2014-11-08 DIAGNOSIS — M1712 Unilateral primary osteoarthritis, left knee: Secondary | ICD-10-CM | POA: Diagnosis present

## 2014-11-08 DIAGNOSIS — G8918 Other acute postprocedural pain: Secondary | ICD-10-CM

## 2014-11-08 DIAGNOSIS — Z6831 Body mass index (BMI) 31.0-31.9, adult: Secondary | ICD-10-CM

## 2014-11-08 HISTORY — PX: TOTAL KNEE ARTHROPLASTY: SHX125

## 2014-11-08 LAB — CREATININE, SERUM: CREATININE: 0.87 mg/dL (ref 0.44–1.00)

## 2014-11-08 LAB — CBC
HCT: 38.8 % (ref 35.0–47.0)
HEMOGLOBIN: 12.9 g/dL (ref 12.0–16.0)
MCH: 29.2 pg (ref 26.0–34.0)
MCHC: 33.2 g/dL (ref 32.0–36.0)
MCV: 88 fL (ref 80.0–100.0)
PLATELETS: 286 10*3/uL (ref 150–440)
RBC: 4.41 MIL/uL (ref 3.80–5.20)
RDW: 14.1 % (ref 11.5–14.5)
WBC: 5.7 10*3/uL (ref 3.6–11.0)

## 2014-11-08 SURGERY — ARTHROPLASTY, KNEE, TOTAL
Anesthesia: Spinal | Laterality: Left

## 2014-11-08 MED ORDER — ZOLPIDEM TARTRATE 5 MG PO TABS
5.0000 mg | ORAL_TABLET | Freq: Every evening | ORAL | Status: DC | PRN
  Administered 2014-11-08: 5 mg via ORAL
  Filled 2014-11-08 (×2): qty 1

## 2014-11-08 MED ORDER — CEFAZOLIN SODIUM-DEXTROSE 2-3 GM-% IV SOLR: 2.0000 g | Freq: Once | INTRAVENOUS | Status: DC

## 2014-11-08 MED ORDER — LOSARTAN POTASSIUM 50 MG PO TABS
100.0000 mg | ORAL_TABLET | Freq: Every day | ORAL | Status: DC
  Administered 2014-11-09 – 2014-11-11 (×3): 100 mg via ORAL
  Filled 2014-11-08 (×3): qty 2

## 2014-11-08 MED ORDER — NITROGLYCERIN 0.4 MG SL SUBL: 0.4000 mg | SUBLINGUAL_TABLET | SUBLINGUAL | Status: DC | PRN

## 2014-11-08 MED ORDER — ONDANSETRON HCL 4 MG/2ML IJ SOLN
4.0000 mg | Freq: Four times a day (QID) | INTRAMUSCULAR | Status: DC | PRN
  Administered 2014-11-09: 4 mg via INTRAVENOUS
  Filled 2014-11-08: qty 2

## 2014-11-08 MED ORDER — MIDAZOLAM HCL 5 MG/5ML IJ SOLN
INTRAMUSCULAR | Status: DC | PRN
  Administered 2014-11-08: 2 mg via INTRAVENOUS

## 2014-11-08 MED ORDER — HYDROCHLOROTHIAZIDE 12.5 MG PO CAPS
12.5000 mg | ORAL_CAPSULE | Freq: Every day | ORAL | Status: DC
  Administered 2014-11-09 – 2014-11-11 (×3): 12.5 mg via ORAL
  Filled 2014-11-08 (×3): qty 1

## 2014-11-08 MED ORDER — BISACODYL 10 MG RE SUPP
10.0000 mg | Freq: Every day | RECTAL | Status: DC | PRN
  Administered 2014-11-10: 10 mg via RECTAL
  Filled 2014-11-08: qty 1

## 2014-11-08 MED ORDER — MEPERIDINE HCL 25 MG/ML IJ SOLN
12.5000 mg | Freq: Two times a day (BID) | INTRAMUSCULAR | Status: DC
  Administered 2014-11-08: 12.5 mg via INTRAVENOUS

## 2014-11-08 MED ORDER — ACETAMINOPHEN 325 MG PO TABS
650.0000 mg | ORAL_TABLET | Freq: Four times a day (QID) | ORAL | Status: DC | PRN
  Administered 2014-11-09: 650 mg via ORAL
  Filled 2014-11-08: qty 2

## 2014-11-08 MED ORDER — LACTATED RINGERS IV SOLN
INTRAVENOUS | Status: DC
  Administered 2014-11-08 (×3): via INTRAVENOUS

## 2014-11-08 MED ORDER — CEFAZOLIN SODIUM-DEXTROSE 2-3 GM-% IV SOLR
INTRAVENOUS | Status: AC
  Administered 2014-11-08: 2 g via INTRAVENOUS
  Filled 2014-11-08: qty 50

## 2014-11-08 MED ORDER — NEOMYCIN-POLYMYXIN B GU 40-200000 IR SOLN
Status: AC
  Filled 2014-11-08: qty 20

## 2014-11-08 MED ORDER — MAGNESIUM HYDROXIDE 400 MG/5ML PO SUSP
30.0000 mL | Freq: Every day | ORAL | Status: DC | PRN
  Administered 2014-11-10: 30 mL via ORAL
  Filled 2014-11-08: qty 30

## 2014-11-08 MED ORDER — FLUTICASONE FUROATE-VILANTEROL 100-25 MCG/INH IN AEPB
1.0000 | INHALATION_SPRAY | Freq: Every day | RESPIRATORY_TRACT | Status: DC
  Administered 2014-11-09 – 2014-11-11 (×3): 1 via RESPIRATORY_TRACT
  Filled 2014-11-08: qty 1

## 2014-11-08 MED ORDER — METOCLOPRAMIDE HCL 5 MG PO TABS: 5.0000 mg | ORAL_TABLET | Freq: Three times a day (TID) | ORAL | Status: DC | PRN

## 2014-11-08 MED ORDER — HYDROXYCHLOROQUINE SULFATE 200 MG PO TABS
200.0000 mg | ORAL_TABLET | Freq: Two times a day (BID) | ORAL | Status: DC
  Administered 2014-11-08 – 2014-11-11 (×7): 200 mg via ORAL
  Filled 2014-11-08 (×7): qty 1

## 2014-11-08 MED ORDER — NEBIVOLOL HCL 10 MG PO TABS
10.0000 mg | ORAL_TABLET | Freq: Every day | ORAL | Status: DC
  Administered 2014-11-10 – 2014-11-11 (×2): 10 mg via ORAL
  Filled 2014-11-08 (×4): qty 1

## 2014-11-08 MED ORDER — DOCUSATE SODIUM 100 MG PO CAPS
100.0000 mg | ORAL_CAPSULE | Freq: Two times a day (BID) | ORAL | Status: DC
  Administered 2014-11-08 – 2014-11-10 (×5): 100 mg via ORAL
  Filled 2014-11-08 (×5): qty 1

## 2014-11-08 MED ORDER — CENTRUM SILVER ADULT 50+ PO TABS: 1.0000 | ORAL_TABLET | Freq: Every morning | ORAL | Status: DC

## 2014-11-08 MED ORDER — FENTANYL CITRATE (PF) 100 MCG/2ML IJ SOLN: 25.0000 ug | INTRAMUSCULAR | Status: DC | PRN

## 2014-11-08 MED ORDER — KETAMINE HCL 50 MG/ML IJ SOLN
INTRAMUSCULAR | Status: DC | PRN
  Administered 2014-11-08: 50 mg via INTRAMUSCULAR

## 2014-11-08 MED ORDER — MEPERIDINE HCL 25 MG/ML IJ SOLN
INTRAMUSCULAR | Status: AC
  Filled 2014-11-08: qty 1

## 2014-11-08 MED ORDER — NEOMYCIN-POLYMYXIN B GU 40-200000 IR SOLN
Status: DC | PRN
Start: 2014-11-08 — End: 2014-11-08
  Administered 2014-11-08: 14 mL

## 2014-11-08 MED ORDER — BUPIVACAINE HCL (PF) 0.5 % IJ SOLN
INTRAMUSCULAR | Status: DC | PRN
  Administered 2014-11-08: 3 mL

## 2014-11-08 MED ORDER — MORPHINE SULFATE (PF) 10 MG/ML IV SOLN
INTRAVENOUS | Status: AC
  Filled 2014-11-08: qty 1

## 2014-11-08 MED ORDER — PHENOL 1.4 % MT LIQD: 1.0000 | OROMUCOSAL | Status: DC | PRN

## 2014-11-08 MED ORDER — BUPIVACAINE LIPOSOME 1.3 % IJ SUSP
INTRAMUSCULAR | Status: AC
  Filled 2014-11-08: qty 20

## 2014-11-08 MED ORDER — PROPOFOL INFUSION 10 MG/ML OPTIME
INTRAVENOUS | Status: DC | PRN
  Administered 2014-11-08: 50 ug/kg/min via INTRAVENOUS

## 2014-11-08 MED ORDER — LORATADINE 10 MG PO TABS: 10.0000 mg | ORAL_TABLET | Freq: Every day | ORAL | Status: DC | PRN

## 2014-11-08 MED ORDER — BUPIVACAINE-EPINEPHRINE (PF) 0.25% -1:200000 IJ SOLN
INTRAMUSCULAR | Status: DC | PRN
Start: 2014-11-08 — End: 2014-11-08

## 2014-11-08 MED ORDER — MAGNESIUM CITRATE PO SOLN: 1.0000 | Freq: Once | ORAL | Status: DC | PRN

## 2014-11-08 MED ORDER — AMITRIPTYLINE HCL 25 MG PO TABS
25.0000 mg | ORAL_TABLET | Freq: Every day | ORAL | Status: DC
  Administered 2014-11-08 – 2014-11-10 (×3): 25 mg via ORAL
  Filled 2014-11-08 (×3): qty 1

## 2014-11-08 MED ORDER — ONDANSETRON HCL 4 MG/2ML IJ SOLN: 4.0000 mg | Freq: Once | INTRAMUSCULAR | Status: DC | PRN

## 2014-11-08 MED ORDER — SIMVASTATIN 20 MG PO TABS
20.0000 mg | ORAL_TABLET | Freq: Every day | ORAL | Status: DC
  Administered 2014-11-08 – 2014-11-10 (×3): 20 mg via ORAL
  Filled 2014-11-08 (×3): qty 1

## 2014-11-08 MED ORDER — MENTHOL 3 MG MT LOZG: 1.0000 | LOZENGE | OROMUCOSAL | Status: DC | PRN

## 2014-11-08 MED ORDER — ONDANSETRON HCL 4 MG PO TABS
4.0000 mg | ORAL_TABLET | Freq: Four times a day (QID) | ORAL | Status: DC | PRN
  Administered 2014-11-08: 4 mg via ORAL
  Filled 2014-11-08: qty 1

## 2014-11-08 MED ORDER — FLUTICASONE PROPIONATE 50 MCG/ACT NA SUSP
1.0000 | Freq: Every day | NASAL | Status: DC
  Administered 2014-11-09 – 2014-11-11 (×3): 1 via NASAL
  Filled 2014-11-08: qty 16

## 2014-11-08 MED ORDER — ENOXAPARIN SODIUM 30 MG/0.3ML ~~LOC~~ SOLN
30.0000 mg | Freq: Two times a day (BID) | SUBCUTANEOUS | Status: DC
  Administered 2014-11-09 – 2014-11-11 (×5): 30 mg via SUBCUTANEOUS
  Filled 2014-11-08 (×5): qty 0.3

## 2014-11-08 MED ORDER — OXYCODONE HCL 5 MG PO TABS
5.0000 mg | ORAL_TABLET | ORAL | Status: DC | PRN
  Administered 2014-11-08 – 2014-11-09 (×4): 5 mg via ORAL
  Administered 2014-11-09 (×2): 10 mg via ORAL
  Administered 2014-11-09: 5 mg via ORAL
  Administered 2014-11-10: 10 mg via ORAL
  Administered 2014-11-10: 5 mg via ORAL
  Administered 2014-11-10 – 2014-11-11 (×3): 10 mg via ORAL
  Filled 2014-11-08 (×2): qty 2
  Filled 2014-11-08: qty 1
  Filled 2014-11-08 (×3): qty 2
  Filled 2014-11-08 (×5): qty 1
  Filled 2014-11-08: qty 2

## 2014-11-08 MED ORDER — AMLODIPINE BESYLATE 5 MG PO TABS
5.0000 mg | ORAL_TABLET | Freq: Every day | ORAL | Status: DC
  Administered 2014-11-09 – 2014-11-11 (×3): 5 mg via ORAL
  Filled 2014-11-08 (×3): qty 1

## 2014-11-08 MED ORDER — SODIUM CHLORIDE 0.9 % IV SOLN
INTRAVENOUS | Status: DC
  Administered 2014-11-08: 13:00:00 via INTRAVENOUS

## 2014-11-08 MED ORDER — SODIUM CHLORIDE 0.9 % IJ SOLN
INTRAMUSCULAR | Status: AC
  Filled 2014-11-08: qty 100

## 2014-11-08 MED ORDER — MONTELUKAST SODIUM 10 MG PO TABS
10.0000 mg | ORAL_TABLET | Freq: Every evening | ORAL | Status: DC
  Administered 2014-11-08 – 2014-11-10 (×3): 10 mg via ORAL
  Filled 2014-11-08 (×3): qty 1

## 2014-11-08 MED ORDER — SODIUM CHLORIDE 0.9 % IJ SOLN
INTRAMUSCULAR | Status: AC
  Filled 2014-11-08: qty 3

## 2014-11-08 MED ORDER — CEFAZOLIN SODIUM-DEXTROSE 2-3 GM-% IV SOLR
2.0000 g | Freq: Four times a day (QID) | INTRAVENOUS | Status: AC
  Administered 2014-11-08: 2 g via INTRAVENOUS
  Filled 2014-11-08 (×2): qty 50

## 2014-11-08 MED ORDER — TRANEXAMIC ACID 1000 MG/10ML IV SOLN
1000.0000 mg | INTRAVENOUS | Status: AC
  Administered 2014-11-08: 1000 mg via INTRAVENOUS
  Filled 2014-11-08: qty 10

## 2014-11-08 MED ORDER — FAMOTIDINE 20 MG PO TABS: 10.0000 mg | ORAL_TABLET | Freq: Two times a day (BID) | ORAL | Status: DC

## 2014-11-08 MED ORDER — SODIUM CHLORIDE 0.9 % IJ SOLN
INTRAMUSCULAR | Status: DC | PRN
  Administered 2014-11-08: 09:00:00 via INTRAVENOUS

## 2014-11-08 MED ORDER — BUPIVACAINE-EPINEPHRINE (PF) 0.25% -1:200000 IJ SOLN
INTRAMUSCULAR | Status: AC
  Filled 2014-11-08: qty 30

## 2014-11-08 MED ORDER — LIDOCAINE HCL (PF) 1 % IJ SOLN
INTRAMUSCULAR | Status: AC
  Administered 2014-11-08: 2 mL
  Filled 2014-11-08: qty 2

## 2014-11-08 MED ORDER — METOCLOPRAMIDE HCL 5 MG/ML IJ SOLN
5.0000 mg | Freq: Three times a day (TID) | INTRAMUSCULAR | Status: DC | PRN
  Administered 2014-11-09: 10 mg via INTRAVENOUS
  Filled 2014-11-08: qty 2

## 2014-11-08 MED ORDER — KETOROLAC TROMETHAMINE 30 MG/ML IJ SOLN
INTRAMUSCULAR | Status: DC | PRN
Start: 2014-11-08 — End: 2014-11-08

## 2014-11-08 MED ORDER — ONDANSETRON HCL 4 MG/2ML IJ SOLN
INTRAMUSCULAR | Status: DC | PRN
  Administered 2014-11-08: 4 mg via INTRAVENOUS

## 2014-11-08 MED ORDER — BUPIVACAINE LIPOSOME 1.3 % IJ SUSP
INTRAMUSCULAR | Status: DC | PRN
  Administered 2014-11-08: 20 mL

## 2014-11-08 MED ORDER — ACETAMINOPHEN 650 MG RE SUPP: 650.0000 mg | Freq: Four times a day (QID) | RECTAL | Status: DC | PRN

## 2014-11-08 MED ORDER — MORPHINE SULFATE (PF) 2 MG/ML IV SOLN
2.0000 mg | INTRAVENOUS | Status: DC | PRN
  Administered 2014-11-08: 2 mg via INTRAVENOUS
  Filled 2014-11-08 (×2): qty 1

## 2014-11-08 MED ORDER — RISAQUAD PO CAPS
2.0000 | ORAL_CAPSULE | Freq: Every day | ORAL | Status: DC
  Administered 2014-11-08 – 2014-11-11 (×4): 2 via ORAL
  Filled 2014-11-08 (×4): qty 2

## 2014-11-08 SURGICAL SUPPLY — 50 items
BANDAGE ELASTIC 6 CLIP ST LF (GAUZE/BANDAGES/DRESSINGS) ×3 IMPLANT
BLADE SAW 1 (BLADE) ×3 IMPLANT
BLOCK CUTTING FEMUR 3 LT MED (MISCELLANEOUS) ×3 IMPLANT
BLOCK CUTTING TIBIAL 2 LT (MISCELLANEOUS) IMPLANT
CANISTER SUCT 1200ML W/VALVE (MISCELLANEOUS) ×3 IMPLANT
CANISTER SUCT 3000ML (MISCELLANEOUS) ×6 IMPLANT
CAPT KNEE TOTAL 3 ×3 IMPLANT
CATH FOL LEG HOLDER (MISCELLANEOUS) ×3 IMPLANT
CATH TRAY METER 16FR LF (MISCELLANEOUS) ×3 IMPLANT
CEMENT HV SMART SET (Cement) ×6 IMPLANT
CHLORAPREP W/TINT 26ML (MISCELLANEOUS) ×6 IMPLANT
COOLER POLAR GLACIER W/PUMP (MISCELLANEOUS) ×3 IMPLANT
DRAPE INCISE IOBAN 66X45 STRL (DRAPES) ×6 IMPLANT
DRAPE SHEET LG 3/4 BI-LAMINATE (DRAPES) ×6 IMPLANT
ELECT CAUTERY BLADE 6.4 (BLADE) ×3 IMPLANT
GAUZE PETRO XEROFOAM 1X8 (MISCELLANEOUS) ×3 IMPLANT
GAUZE SPONGE 4X4 12PLY STRL (GAUZE/BANDAGES/DRESSINGS) ×3 IMPLANT
GLOVE BIOGEL PI IND STRL 9 (GLOVE) ×3 IMPLANT
GLOVE BIOGEL PI INDICATOR 9 (GLOVE) ×6
GLOVE SURG ORTHO 9.0 STRL STRW (GLOVE) ×9 IMPLANT
GOWN SPECIALTY ULTRA XL (MISCELLANEOUS) ×6 IMPLANT
GOWN STRL REUS W/ TWL LRG LVL3 (GOWN DISPOSABLE) ×2 IMPLANT
GOWN STRL REUS W/TWL LRG LVL3 (GOWN DISPOSABLE) ×4
HANDPIECE SUCTION TUBG SURGILV (MISCELLANEOUS) ×3 IMPLANT
HOOD PEEL AWAY FACE SHEILD DIS (HOOD) ×6 IMPLANT
IMMBOLIZER KNEE 19 BLUE UNIV (SOFTGOODS) ×3 IMPLANT
IV SET EXTENSION 6 LL TADAPT (SET/KITS/TRAYS/PACK) ×3 IMPLANT
KNEE MEDACTA TIBIAL/FEMORAL BL (Knees) ×3 IMPLANT
KNIFE SCULPS 14X20 (INSTRUMENTS) ×3 IMPLANT
NDL SAFETY 18GX1.5 (NEEDLE) ×3 IMPLANT
NEEDLE SPNL 18GX3.5 QUINCKE PK (NEEDLE) ×3 IMPLANT
NEEDLE SPNL 20GX3.5 QUINCKE YW (NEEDLE) ×3 IMPLANT
NS IRRIG 1000ML POUR BTL (IV SOLUTION) ×3 IMPLANT
PACK TOTAL KNEE (MISCELLANEOUS) ×3 IMPLANT
PAD GROUND ADULT SPLIT (MISCELLANEOUS) ×3 IMPLANT
PAD WRAPON POLAR KNEE (MISCELLANEOUS) ×1 IMPLANT
SOL .9 NS 3000ML IRR  AL (IV SOLUTION) ×2
SOL .9 NS 3000ML IRR UROMATIC (IV SOLUTION) ×1 IMPLANT
STAPLER SKIN PROX 35W (STAPLE) ×3 IMPLANT
STRAP SAFETY BODY (MISCELLANEOUS) ×3 IMPLANT
SUCTION FRAZIER TIP 10 FR DISP (SUCTIONS) ×3 IMPLANT
SUT DVC 2 QUILL PDO  T11 36X36 (SUTURE) ×2
SUT DVC 2 QUILL PDO T11 36X36 (SUTURE) ×1 IMPLANT
SUT DVC QUILL MONODERM 30X30 (SUTURE) ×3 IMPLANT
SUT ETHIBOND NAB CT1 #1 30IN (SUTURE) ×3 IMPLANT
SYR 20CC LL (SYRINGE) ×6 IMPLANT
SYR 50ML LL SCALE MARK (SYRINGE) ×3 IMPLANT
TOWER CARTRIDGE SMART MIX (DISPOSABLE) ×3 IMPLANT
WATER STERILE IRR 1000ML POUR (IV SOLUTION) IMPLANT
WRAPON POLAR PAD KNEE (MISCELLANEOUS) ×3

## 2014-11-08 NOTE — H&P (Signed)
Reviewed paper H+P, will be scanned into chart. No changes noted.  

## 2014-11-08 NOTE — Transfer of Care (Signed)
Immediate Anesthesia Transfer of Care Note  Patient: Rhonda Murphy  Procedure(s) Performed: Procedure(s): TOTAL KNEE ARTHROPLASTY (Left)  Patient Location: PACU  Anesthesia Type:Spinal  Level of Consciousness: sedated  Airway & Oxygen Therapy: Patient Spontanous Breathing and Patient connected to face mask oxygen  Post-op Assessment: Report given to RN  Post vital signs: Reviewed and stable  Last Vitals:  Filed Vitals:   11/08/14 0623  BP: 186/69  Pulse: 83  Temp: 36.7 C  Resp: 16    Complications: No apparent anesthesia complications

## 2014-11-08 NOTE — Progress Notes (Signed)
PAIN RELIEVED WITH PRESENT  REGIME. NEURO CHECKS WNL. GOOD APPETITE.

## 2014-11-08 NOTE — Clinical Social Work Placement (Signed)
   CLINICAL SOCIAL WORK PLACEMENT  NOTE  Date:  11/08/2014  Patient Details  Name: BESSYE STITH MRN: [id number] Date of Birth: 1945/05/28  Clinical Social Work is seeking post-discharge placement for this patient at the Skilled  Nursing Facility level of care (*CSW will initial, date and re-position this form in  chart as items are completed):  Yes   Patient/family provided with [facility] Clinical Social Work Department's list of facilities offering this level of care within the geographic area requested by the patient (or if unable, by the patient's family).  Yes   Patient/family informed of their freedom to choose among providers that offer the needed level of care, that participate in Medicare, Medicaid or managed care program needed by the patient, have an available bed and are willing to accept the patient.      Patient/family informed of 's ownership interest in [facility] and [facility], as well as of the fact that they are under no obligation to receive care at these facilities.  PASRR submitted to EDS on  (Patient does not require a PASARR going to a SNF in [place]. )     PASRR number received on       Existing PASRR number confirmed on       FL2 transmitted to all facilities in geographic area requested by pt/family on 11/08/14     FL2 transmitted to all facilities within larger geographic area on       Patient informed that his/her managed care company has contracts with or will negotiate with certain facilities, including the following:        Yes   Patient/family informed of bed offers received.  Patient chooses bed at  [facility] [place], [place]) )     Physician recommends and patient chooses bed at      Patient to be transferred to   on  .  Patient to be transferred to facility by       Patient family notified on   of transfer.  Name of family member notified:        PHYSICIAN Please sign FL2     Additional Comment:     _______________________________________________ Haig Prophet, LCSW 11/08/2014, 4:59 PM

## 2014-11-08 NOTE — Evaluation (Addendum)
Physical Therapy Evaluation Patient Details Name: Rhonda Murphy MRN: [id number] DOB: 30-Jan-1946 Today's Date: 11/08/2014   History of Present Illness  Pt underwent L TKR and is POD#0 at time of evaluation. Pt underwent R TKR April 2016. No reported post-op complications. Pt reports full sensation has returned to RLE.  Clinical Impression  Pt demonstrates excellent LLE strength for POD#0. She is able to perform full SLR without assist. She does require assistance with bed mobility and transfers. Pt transferred from bed to chair but ambulation and ROM measurements deferred due to pain. Ambulation training will start tomorrow and ROM measurements will be obtained. Pt will need SNF placement at discharge in order to facilitate safe return to prior level of function. Pt will benefit from skilled PT services to address deficits in strength, balance, and mobility in order to return to full function at home.     Follow Up Recommendations SNF    Equipment Recommendations  None recommended by PT    Recommendations for Other Services       Precautions / Restrictions Precautions Precautions: Fall Restrictions Weight Bearing Restrictions: Yes LLE Weight Bearing: Weight bearing as tolerated      Mobility  Bed Mobility Overal bed mobility: Needs Assistance Bed Mobility: Supine to Sit     Supine to sit: Min assist     General bed mobility comments: Pt requires come assistance for position and adduction of LLE. Good UE strength noted with use of bed rails and HOB elevated to come to upright sitting. Overall speed is fair  Transfers Overall transfer level: Needs assistance Equipment used: Rolling walker (2 wheeled) Transfers: Sit to/from Stand Sit to Stand: Min assist         General transfer comment: Pt requires cues for safe hand placement and sequencing with rolling walker. Decreased weight shifting to LLE in standing. Pt reports increase in pain. Orthostatic negative from supine to  sit and sit to stand. Pt denies dizziness or nausea with position changes. Bed to chair transfer only today.  Ambulation/Gait             General Gait Details: Deferred today due to pain, POD#0.  Stairs            Wheelchair Mobility    Modified Rankin (Stroke Patients Only)       Balance Overall balance assessment: Needs assistance   Sitting balance-Leahy Scale: Good       Standing balance-Leahy Scale: Fair                               Pertinent Vitals/Pain Pain Assessment: No/denies pain (Denies pain at start of session)    Home Living Family/patient expects to be discharged to:: Skilled nursing facility Living Arrangements: Spouse/significant other Available Help at Discharge: Family Type of Home: House Home Access: Stairs to enter Entrance Stairs-Rails: Can reach both Entrance Stairs-Number of Steps: 4 Home Layout: Multi-level;Able to live on main level with bedroom/bathroom Home Equipment: Dan Humphreys - 2 wheels;Bedside commode (Walk-in shower, no seat, )      Prior Function Level of Independence: Independent               Hand Dominance        Extremity/Trunk Assessment   Upper Extremity Assessment: Overall WFL for tasks assessed           Lower Extremity Assessment: LLE deficits/detail   LLE Deficits / Details: Able to perform full SLR on  LLE without assist. Good L hip abduction/adduction strength and SAQ with minimal assistance. No extensor lag noted. Full L DF/PF against resistance in supine. Pt reports full sensation to light touch of LLE.      Communication   Communication: No difficulties  Cognition Arousal/Alertness: Awake/alert Behavior During Therapy: WFL for tasks assessed/performed Overall Cognitive Status: Within Functional Limits for tasks assessed                      General Comments      Exercises Total Joint Exercises Ankle Circles/Pumps: Strengthening;Both;Supine;10 reps Quad Sets:  Strengthening;Both;10 reps;Supine Gluteal Sets: Strengthening;Both;10 reps;Supine Towel Squeeze: Strengthening;Both;10 reps;Supine Short Arc Quad: Strengthening;Both;10 reps;Supine Hip ABduction/ADduction: Strengthening;Both;10 reps;Supine Straight Leg Raises: Strengthening;Both;10 reps;Supine Goniometric ROM: Deferred today due to pain. Will measure tomorrow      Assessment/Plan    PT Assessment Patient needs continued PT services  PT Diagnosis Difficulty walking;Abnormality of gait;Acute pain;Generalized weakness   PT Problem List Decreased strength;Decreased range of motion;Decreased activity tolerance;Decreased balance;Decreased mobility;Pain  PT Treatment Interventions DME instruction;Gait training;Stair training;Therapeutic activities;Therapeutic exercise;Balance training;Neuromuscular re-education;Manual techniques   PT Goals (Current goals can be found in the Care Plan section) Acute Rehab PT Goals Patient Stated Goal: "I want to go to rehab" PT Goal Formulation: With patient/family Time For Goal Achievement: 11/22/14 Potential to Achieve Goals: Good    Frequency BID   Barriers to discharge        Co-evaluation               End of Session Equipment Utilized During Treatment: Gait belt Activity Tolerance: Patient limited by pain Patient left: in chair;with call bell/phone within reach;with chair alarm set;with SCD's reapplied (towel roll under heel, polar care in place) Nurse Communication: Mobility status         Time: 5784-6962 PT Time Calculation (min) (ACUTE ONLY): 43 min   Charges:   PT Evaluation $Initial PT Evaluation Tier I: 1 Procedure PT Treatments $Therapeutic Activity: 8-22 mins   PT G Codes:       Rhonda Murphy PT, DPT   Rhonda Murphy 11/08/2014, 5:07 PM

## 2014-11-08 NOTE — Anesthesia Procedure Notes (Addendum)
Spinal Patient location during procedure: OR Start time: 11/08/2014 7:26 AM End time: 11/08/2014 7:32 AM Staffing Anesthesiologist: Martha Clan Resident/CRNA: Nelda Marseille Performed by: resident/CRNA  Preanesthetic Checklist Completed: patient identified, site marked, surgical consent, pre-op evaluation, timeout performed, IV checked, risks and benefits discussed and monitors and equipment checked Spinal Block Patient position: sitting Prep: ChloraPrep Patient monitoring: heart rate, continuous pulse ox, blood pressure and cardiac monitor Approach: midline Location: L4-5 Injection technique: single-shot Needle Needle type: Whitacre and Introducer  Needle gauge: 24 G Needle length: 9 cm Additional Notes Negative paresthesia. Negative blood return. Positive free-flowing CSF. Expiration date of kit checked and confirmed. Patient tolerated procedure well, without complications.

## 2014-11-08 NOTE — Clinical Documentation Improvement (Signed)
Orthopedic  Based on the clinical findings below, please document the type of Osteoarthritis for greater specificity the patient has or may have.  Thank you   Primary  Secondary  Erosive  Post-traumatic  Polyarticular  Other  Clinically Undetermined    Please exercise your independent, professional judgment when responding. A specific answer is not anticipated or expected.   Thank You, Leonette Most Deloros Be[facility] [place]

## 2014-11-08 NOTE — Progress Notes (Signed)
oob to chair for 2.5hrs. minmal pain neuro checks wnl. md reports pt can take a probiotic,own zantac and brio(spoke with dr Rosita Kea)

## 2014-11-08 NOTE — Clinical Social Work Note (Addendum)
Clinical Social Work Assessment  Patient Details  Name: Rhonda Murphy MRN: [id number] Date of Birth: 20-Mar-1945  Date of referral:  11/08/14               Reason for consult:  Facility Placement                Permission sought to share information with:  Chartered certified accountant granted to share information::  Yes, Verbal Permission Granted  Name::      [place]::   [place], [place].   Relationship::     Contact Information:     Housing/Transportation Living arrangements for the past 2 months:  [place] of Information:  Patient, Spouse Patient Interpreter Needed:  None Criminal Activity/Legal Involvement Pertinent to Current Situation/Hospitalization:  No - Comment as needed Significant Relationships:  Spouse Lives with:  Spouse Do you feel safe going back to the place where you live?  Yes Need for family participation in patient care:  Yes (Comment)  Care giving concerns: Patient lives in [place], [place] with her husband [phone].    Social Worker assessment / plan: Holiday representative (CSW) met with patient and her husband was at bedside. CSW introduced self and explained role of CSW department. Patient reported that she had the same surgery on a different leg in April. Patient and husband live in [place], [place] and have their doctors appointments in [place], [place]. Patient reported that she has pre-registered at [facility] in [place]. Patient gave CSW a pre-registration card from [facility]. CSW contacted admission coordinator Rhonda Murphy at [facility]. Rhonda Murphy reported that patient has a bed at [facility] and requested that D/C Summary and prescriptions be sent Friday. Plan is for patient to D/C to [facility] on Sunday.   FL2 complete and on chart.   Employment status:  Retired Forensic scientist:  Medicare PT Recommendations:  [place] / Referral to community resources:  [place]  Patient/Family's Response to care: Patient and husband are agreeable for patient to go to [facility].   Patient/Family's Understanding of and Emotional Response to Diagnosis, Current Treatment, and Prognosis: Patient and husband thanked CSW for visit and assisting with placement.   Emotional Assessment Appearance:  Appears stated age Attitude/Demeanor/Rapport:    Affect (typically observed):  Accepting, Adaptable, Pleasant Orientation:  Oriented to Self, Oriented to Place, Oriented to  Time, Oriented to Situation Alcohol / Substance use:  Not Applicable Psych involvement (Current and /or in the community):  No (Comment)  Discharge Needs  Concerns to be addressed:  Discharge Planning Concerns Readmission within the last 30 days:  No Current discharge risk:  None Barriers to Discharge:  Continued Medical Work up   Rhonda Freshwater, LCSW 11/08/2014, 5:00 PM

## 2014-11-08 NOTE — Care Management (Signed)
Met with patient and her husband to discuss discharge planning. She lives in [place]. She wants to go to [facility]" for SNF/rehab. She has a rolling walker present in this room. She uses [facility] in [place] for Rx. RNCM will continue to follow. CSW updated.

## 2014-11-08 NOTE — Op Note (Signed)
11/08/2014  9:39 AM  PATIENT:  Ulla Gallo  69 y.o. female  PRE-OPERATIVE DIAGNOSIS:  OA  POST-OPERATIVE DIAGNOSIS:  osteoarthritis  PROCEDURE:  Procedure(s): TOTAL KNEE ARTHROPLASTY (Left)  SURGEON: Leitha Schuller, MD  ASSISTANTS: [place] Neighbor [facility]  ANESTHESIA:   spinal  EBL:  Total I/O In: 1100 [I.V.:1100] Out: 550 [Urine:500; Blood:50]  BLOOD ADMINISTERED:none  DRAINS: none   LOCAL MEDICATIONS USED:  MARCAINE    and OTHER Toradol morphine and Exparel  SPECIMEN:  Source of Specimen:  Cut ends of bone left knee  DISPOSITION OF SPECIMEN:  PATHOLOGY  COUNTS:  YES  TOURNIQUET:   84 minutes at 300 mmHg  IMPLANTS: Medacta GMK sphere left 3 femur to left tibia with 12 mm insert and 2 patella all components cemented  DICTATION: .Dragon Dictation patient was brought to the operating room and after adequate anesthesia was obtained left leg was prepped draped in sterile fashion with a tourniquet by the upper thigh. After patient identification and timeout procedures were completed, tourniquet was raised to 300 mmHg. Midline skin incision was made followed by medial parapatellar arthrotomy. There is extensive wear of the tibial condyles bilaterally but extremely eburnated bone in the lateral compartment extensive wear of the lateral femoral condyle as well and patellofemoral joint. The anterior cruciate ligament and fat pad were excised and the Medacta cutting blocks were applied after removing some residual cartilage. Proximal tibia and distal femur cuts were made and adequate extension gap was present. The 4-in-1 cutting guide was applied the distal femur anterior posterior chamfer cuts made followed by the trochlear groove cut of the proximal tibia cut was completed and the residual menisci excised at this point. The tibia template was applied and proximal drill hole made and trials placed 12 mm insert gave good stability. Next the trials were removed and and the patella was cut  using the patellar cutting guide drill holes made and it sized to a size 2. This point local asthenic with half percent Sensorcaine, Toradol, and morphine were infiltrated around the joint along with a dilute Exparel. The third bony surfaces were thoroughly irrigated and dried. The components were then cemented into place to the tibia first after all implants were placed and excess cement and removed. Further cement set. Next the knee was thoroughly irrigated joint was checked for loose cement and after thorough irrigation the patella was noted to track very well with no touch technique and lateral release not required. The medial arthrotomy was then repaired using a heavy Quill to a Quill substantially and skin staples patient center comes stable condition after application of sterile dressings of Xeroform 4 x 4 ABDs and web roll Polar Care and Ace wrap  PLAN OF CARE: Admit to inpatient   PATIENT DISPOSITION:  PACU - hemodynamically stable.

## 2014-11-08 NOTE — Anesthesia Postprocedure Evaluation (Signed)
  Anesthesia Post-op Note  Patient: Rhonda Murphy  Procedure(s) Performed: Procedure(s): TOTAL KNEE ARTHROPLASTY (Left)  Anesthesia type:Spinal  Patient location: PACU  Post pain: Pain level controlled  Post assessment: Post-op Vital signs reviewed, Patient's Cardiovascular Status Stable, Respiratory Function Stable, Patent Airway and No signs of Nausea or vomiting  Post vital signs: Reviewed and stable  Last Vitals:  Filed Vitals:   11/08/14 1342  BP: 158/60  Pulse: 73  Temp: 36.4 C  Resp: 18    Level of consciousness: awake, alert  and patient cooperative  Complications: No apparent anesthesia complications

## 2014-11-08 NOTE — Anesthesia Preprocedure Evaluation (Addendum)
Anesthesia Evaluation  Patient identified by MRN, date of birth, ID band Patient awake    Reviewed: Allergy & Precautions, H&P , NPO status , Patient's Chart, lab work & pertinent test results, reviewed documented beta blocker date and time   History of Anesthesia Complications Negative for: history of anesthetic complications  Airway Mallampati: II  TM Distance: >3 FB Neck ROM: full    Dental no notable dental hx. (+) Teeth Intact   Pulmonary neg shortness of breath, asthma , neg sleep apnea, neg COPDneg recent URI,  breath sounds clear to auscultation  Pulmonary exam normal       Cardiovascular Exercise Tolerance: Good hypertension, On Medications and On Home Beta Blockers - angina- CAD, - Past MI, - Cardiac Stents and - CABG Normal cardiovascular exam+ dysrhythmias (LBBB) + Valvular Problems/Murmurs MVP Rhythm:regular Rate:Normal     Neuro/Psych negative psych ROS   GI/Hepatic Neg liver ROS, GERD-  Medicated and Controlled,  Endo/Other  Morbid obesity  Renal/GU negative Renal ROS  negative genitourinary   Musculoskeletal   Abdominal   Peds  Hematology negative hematology ROS (+)   Anesthesia Other Findings Past Medical History:   Mitral valve prolapse                                        Hypertension                                                 Acid reflux                                                  Asthma                                                       Shingles                                                     C. difficile colitis                                         Back pain                                                    Influenza A                                     03/24/2013     Small bowel obstruction  03/24/2013       Comment:SBO vs. enteritis.    Enteritis                                       03/21/2013     LBBB (left bundle branch block)                  03/21/2013     Asthma                                                       Reproductive/Obstetrics negative OB ROS                            Anesthesia Physical Anesthesia Plan  ASA: III  Anesthesia Plan: Spinal and MAC   Post-op Pain Management:    Induction:   Airway Management Planned:   Additional Equipment:   Intra-op Plan:   Post-operative Plan:   Informed Consent: I have reviewed the patients History and Physical, chart, labs and discussed the procedure including the risks, benefits and alternatives for the proposed anesthesia with the patient or authorized representative who has indicated his/her understanding and acceptance.   Dental Advisory Given  Plan Discussed with: Anesthesiologist, CRNA and Surgeon  Anesthesia Plan Comments:        Anesthesia Quick Evaluation

## 2014-11-09 LAB — CBC
HCT: 31.7 % — ABNORMAL LOW (ref 35.0–47.0)
Hemoglobin: 10.6 g/dL — ABNORMAL LOW (ref 12.0–16.0)
MCH: 29.2 pg (ref 26.0–34.0)
MCHC: 33.4 g/dL (ref 32.0–36.0)
MCV: 87.4 fL (ref 80.0–100.0)
PLATELETS: 273 10*3/uL (ref 150–440)
RBC: 3.63 MIL/uL — ABNORMAL LOW (ref 3.80–5.20)
RDW: 13.6 % (ref 11.5–14.5)
WBC: 8.8 10*3/uL (ref 3.6–11.0)

## 2014-11-09 LAB — BASIC METABOLIC PANEL
ANION GAP: 7 (ref 5–15)
BUN: 22 mg/dL — AB (ref 6–20)
CALCIUM: 8.5 mg/dL — AB (ref 8.9–10.3)
CO2: 27 mmol/L (ref 22–32)
Chloride: 102 mmol/L (ref 101–111)
Creatinine, Ser: 0.9 mg/dL (ref 0.44–1.00)
GFR calc Af Amer: >60 mL/min (ref >60)
GLUCOSE: 113 mg/dL — AB (ref 65–99)
Potassium: 4 mmol/L (ref 3.5–5.1)
SODIUM: 136 mmol/L (ref 135–145)

## 2014-11-09 MED ORDER — RANITIDINE HCL 150 MG PO TABS
150.0000 mg | ORAL_TABLET | Freq: Every day | ORAL | Status: DC
  Administered 2014-11-10 – 2014-11-11 (×2): 150 mg via ORAL
  Filled 2014-11-09: qty 1

## 2014-11-09 MED ORDER — NON FORMULARY: 150.0000 mg | Status: DC

## 2014-11-09 MED ORDER — OXYCODONE HCL 5 MG PO TABS: 5.0000 mg | ORAL_TABLET | ORAL | Status: DC | PRN

## 2014-11-09 MED ORDER — ENOXAPARIN SODIUM 40 MG/0.4ML ~~LOC~~ SOLN: 40.0000 mg | SUBCUTANEOUS | Status: DC

## 2014-11-09 NOTE — Discharge Instructions (Signed)

## 2014-11-09 NOTE — Progress Notes (Signed)
Physical Therapy Treatment Patient Details Name: Rhonda Murphy MRN: [id number] DOB: 1945/04/26 Today's Date: 11/09/2014    History of Present Illness admitted for acute hospitalization status post L TKR (11/08/24), WBAT.    PT Comments    Patient declined OOB this PM, as just returned to bed from ambulating to/from bathroom with nursing.  Denied symptoms of orthostasis with mobility efforts.  Session emphasis on L LE strength/ROM with supine therex.  Good L knee control noted; minimal/no lag with open-chain activities.   Follow Up Recommendations  SNF     Equipment Recommendations  None recommended by PT    Recommendations for Other Services       Precautions / Restrictions Precautions Precautions: Fall Restrictions Weight Bearing Restrictions: Yes LLE Weight Bearing: Weight bearing as tolerated    Mobility  Bed Mobility Overal bed mobility: Needs Assistance Bed Mobility: Supine to Sit     Supine to sit: Supervision     General bed mobility comments: self-assists L LE with bilat UEs over edge of bed  Transfers Overall transfer level: Needs assistance Equipment used: Rolling walker (2 wheeled) Transfers: Sit to/from Stand Sit to Stand: Min guard         General transfer comment: improved hand placement with movement transition; decreased weight shift/weight acceptance to L LE.  No reports of dizziness with initial transition to upright.  Ambulation/Gait Ambulation/Gait assistance: Min guard Ambulation Distance (Feet): 40 Feet Assistive device: Rolling walker (2 wheeled)       General Gait Details: step to progressing to step through gait pattern; improved L LE  step length with cuing from therapist.  Decreased L heel strike/toe off and subsequent knee flexion throughout gait cycle.  Onset of dizziness/nausea after 40' distance requiring seated rest break (BP 104/64, HR 69).  Additional standing/gait deferred as a result.   Stairs             Wheelchair Mobility    Modified Rankin (Stroke Patients Only)       Balance                                    Cognition Arousal/Alertness: Awake/alert Behavior During Therapy: WFL for tasks assessed/performed Overall Cognitive Status: Within Functional Limits for tasks assessed                      Exercises Total Joint Exercises Goniometric ROM: L knee: 8-77 degrees, act assist ROM; limited by pain Other Exercises Other Exercises: Supine LE therex, 1x20, AROM for muscular strength/endurance/ROM: ankle pumps, quad sets, heel slides, hip abduct/adduct, SAQs and SLR.  Good knee control noted; minimal/no lag with open chain activities.    General Comments        Pertinent Vitals/Pain Pain Assessment: 0-10 Pain Score: 4  Pain Location: L knee Pain Descriptors / Indicators: Aching Pain Intervention(s): Limited activity within patient's tolerance;Monitored during session;Premedicated before session;Repositioned    Home Living Family/patient expects to be discharged to:: Skilled nursing facility Living Arrangements: Spouse/significant other Available Help at Discharge: Family Type of Home: House Home Access: Stairs to enter Entrance Stairs-Rails: Can reach both Home Layout: Multi-level;Able to live on main level with bedroom/bathroom Home Equipment: Dan Humphreys - 2 wheels;Bedside commode      Prior Function Level of Independence: Independent          PT Goals (current goals can now be found in the care plan section) Acute Rehab  PT Goals Patient Stated Goal: "I want to go to rehab" PT Goal Formulation: With patient/family Time For Goal Achievement: 11/22/14 Potential to Achieve Goals: Good Progress towards PT goals: Progressing toward goals    Frequency  BID    PT Plan Current plan remains appropriate    Co-evaluation             End of Session Equipment Utilized During Treatment: Gait belt Activity Tolerance: Patient limited by  pain Patient left: with call bell/phone within reach;with SCD's reapplied;in bed;with bed alarm set     Time: 1341-1402 PT Time Calculation (min) (ACUTE ONLY): 21 min  Charges:  $Gait Training: 8-22 mins $Therapeutic Exercise: 8-22 mins $Therapeutic Activity: 8-22 mins                    G Codes:      Lyvonne Cassell H. Manson Passey, PT, DPT, NCS 11/09/2014, 2:08 PM [phone]

## 2014-11-09 NOTE — Evaluation (Signed)
Occupational Therapy Evaluation Patient Details Name: Rhonda Murphy MRN: [id number] DOB: Apr 05, 1945 Today's Date: 11/09/2014    History of Present Illness This patient is a 69 year old female who came to [facility] for a L TKR.    Clinical Impression   This patient is a 69 year old female who came to [facility] for a L total knee replacement.  Patient lives in a multilevel home but able to stay on one floor. There are 4 steps to enter.  She had been independent with ADL and functional mobility. She now requires assistance and would benefit from Occupational Therapy for ADL/functioal mobility training.      Follow Up Recommendations       Equipment Recommendations       Recommendations for Other Services       Precautions / Restrictions Precautions Precautions: Fall Restrictions Weight Bearing Restrictions: Yes LLE Weight Bearing: Weight bearing as tolerated      Mobility Bed Mobility Overal bed mobility: Modified Independent (for supine to sit) Bed Mobility: Supine to Sit (Contact guard and verbal cues)     Supine to sit: Supervision;Modified independent (Device/Increase time)        Transfers Overall transfer level:  (contact guard and verbal cues) Equipment used: Rolling walker (2 wheeled)   Sit to Stand: Modified independent (Device/Increase time);Min guard              Balance                                            ADL                                         General ADL Comments: Patient had been independent before surgery. She now requires assist. Patient practiced Donned/doffed socks and pants to knees (wrap still in place) with set up, minimal assist and verbal cues.      Vision     Perception     Praxis      Pertinent Vitals/Pain Pain Assessment: 0-10 Pain Score: 6      Hand Dominance     Extremity/Trunk Assessment Upper Extremity Assessment Upper Extremity Assessment:  Overall WFL for tasks assessed   Lower Extremity Assessment Lower Extremity Assessment: Defer to PT evaluation       Communication Communication Communication: No difficulties   Cognition Arousal/Alertness: Awake/alert Behavior During Therapy: WFL for tasks assessed/performed Overall Cognitive Status: Within Functional Limits for tasks assessed                     General Comments       Exercises       Shoulder Instructions      Home Living Family/patient expects to be discharged to:: Skilled nursing facility Living Arrangements: Spouse/significant other Available Help at Discharge: Family Type of Home: House Home Access: Stairs to enter [employer] of Steps: 4 Entrance Stairs-Rails: Can reach both Home Layout: Multi-level;Able to live on main level with bedroom/bathroom           Bathroom Accessibility: Yes   Home Equipment: Walker - 2 wheels;Bedside commode          Prior Functioning/Environment Level of Independence: Independent  OT Diagnosis: Acute pain   OT Problem List:     OT Treatment/Interventions: Self-care/ADL training    OT Goals(Current goals can be found in the care plan section) Acute Rehab OT Goals Patient Stated Goal: "I want to go to rehab" Time For Goal Achievement: 11/23/14 Potential to Achieve Goals: Good  OT Frequency: Min 1X/week   Barriers to D/C:            Co-evaluation              End of Session Equipment Utilized During Treatment:  (hip kit)  Activity Tolerance: Patient tolerated treatment well Patient left: in chair (with physical therapy and nursing)   Time: 9791-5041 OT Time Calculation (min): 23 min Charges:  OT General Charges $OT Visit: 1 Procedure OT Evaluation $Initial OT Evaluation Tier I: 1 Procedure OT Treatments $Self Care/Home Management : 8-22 mins G-Codes:    Myrene Galas, MS/OTR/L  11/09/2014, 11:08 AM

## 2014-11-09 NOTE — Progress Notes (Signed)
   Subjective: 1 Day Post-Op Procedure(s) (LRB): TOTAL KNEE ARTHROPLASTY (Left) Patient reports pain as mild.   Patient is well, and has had no acute complaints or problems We will start therapy today.  Plan is to go Rehab after hospital stay.  Objective: Vital signs in last 24 hours: Temp:  [97.4 F (36.3 C)-98.7 F (37.1 C)] 98.7 F (37.1 C) (08/26 0750) Pulse Rate:  [58-81] 74 (08/26 0750) Resp:  [18] 18 (08/26 0750) BP: (139-167)/(51-68) 141/51 mmHg (08/26 0750) SpO2:  [95 %-100 %] 95 % (08/26 0750)  Intake/Output from previous day: 08/25 0701 - 08/26 0700 In: 3125 [P.O.:960; I.V.:2165] Out: 4125 [Urine:4075; Blood:50] Intake/Output this shift: Total I/O In: -  Out: 125 [Urine:125]   Recent Labs  11/08/14 1153 11/09/14 0705  HGB 12.9 10.6*    Recent Labs  11/08/14 1153 11/09/14 0705  WBC 5.7 8.8  RBC 4.41 3.63*  HCT 38.8 31.7*  PLT 286 273    Recent Labs  11/08/14 1153 11/09/14 0705  NA  --  136  K  --  4.0  CL  --  102  CO2  --  27  BUN  --  22*  CREATININE 0.87 0.90  GLUCOSE  --  113*  CALCIUM  --  8.5*   No results for input(s): LABPT, INR in the last 72 hours.  EXAM General - Patient is Alert, Appropriate and Oriented Extremity - Neurologically intact Neurovascular intact Sensation intact distally Intact pulses distally Dorsiflexion/Plantar flexion intact Dressing - dressing C/D/I Motor Function - intact, moving foot and toes well on exam.   Past Medical History  Diagnosis Date  . Mitral valve prolapse   . Hypertension   . Acid reflux   . Asthma   . Shingles   . C. difficile colitis   . Back pain   . Influenza A 03/24/2013  . Small bowel obstruction 03/24/2013    SBO vs. enteritis.   . Enteritis 03/21/2013  . LBBB (left bundle branch block) 03/21/2013  . Asthma     Assessment/Plan:   1 Day Post-Op Procedure(s) (LRB): TOTAL KNEE ARTHROPLASTY (Left) Active Problems:   Primary osteoarthritis of knee  Estimated body mass index  is 31.45 kg/(m^2) as calculated from the following:   Height as of this encounter:  (1.575 m).   Weight as of this encounter: 78.019 kg (172 lb). Advance diet Up with therapy  Recheck labs in the am Needs BM Plan on discharge to rehab facility Sunday  DVT Prophylaxis - Lovenox, Foot Pumps and TED hose Weight-Bearing as tolerated to right leg D/C O2 and Pulse OX and try on Room Air  T. Cranston Neighbor, PA-C [facility] 11/09/2014, 8:00 AM

## 2014-11-09 NOTE — Progress Notes (Signed)
Patient will D/C to [facility] in [place], [place] on Sunday (11/11/14). Patient's husband will provide transport. Per [facility] admissions coordinator at [facility] patient is going to private room 11. RN will call report to [phone]. Clinical Child psychotherapist (CSW) sent D/C Summary to [facility] via [facility] today. CSW also faxed prescription to [facility]. Patient and husband are aware of above. CSW will continue to follow and assist as needed.   Jetta Lout, LCSWA [phone]

## 2014-11-09 NOTE — Discharge Summary (Signed)
Physician Discharge Summary  Patient ID: Rhonda Murphy MRN: [id number] DOB/AGE: 1946/03/05 69 y.o.  Admit date: 11/08/2014 Discharge date: 11/09/2014  Admission Diagnoses:  OA   Discharge Diagnoses: Patient Active Problem List   Diagnosis Date Noted  . Primary osteoarthritis of knee 11/08/2014  . Primary localized osteoarthrosis of the knee 07/14/2014  . Leukopenia 03/25/2013  . Influenza A 03/24/2013  . Small bowel obstruction 03/24/2013  . Fever, unspecified 03/23/2013  . Enteritis 03/21/2013  . Vomiting 03/21/2013  . LBBB (left bundle branch block) 03/21/2013  . GERD 02/11/2010  . OTHER DYSPHAGIA 02/11/2010  . ANEMIA 01/07/2010  . DIARRHEA 01/07/2010  . ABDOMINAL PAIN 01/07/2010  . CLOSTRIDIUM DIFFICILE COLITIS, HX OF 01/07/2010    Past Medical History  Diagnosis Date  . Mitral valve prolapse   . Hypertension   . Acid reflux   . Asthma   . Shingles   . C. difficile colitis   . Back pain   . Influenza A 03/24/2013  . Small bowel obstruction 03/24/2013    SBO vs. enteritis.   . Enteritis 03/21/2013  . LBBB (left bundle branch block) 03/21/2013  . Asthma       Discharged Condition: Improved  Hospital Course: Rhonda Murphy is an 69 y.o. female who was admitted 11/08/2014 with a diagnosis of left knee OA and went to the operating room on 11/08/2014 and underwent the above named procedures.    Surgeries: Procedure(s): TOTAL KNEE ARTHROPLASTY on 11/08/2014 Patient tolerated the surgery well. Taken to PACU where she was stabilized and then transferred to the orthopedic floor.  Started on Lovenox lovenox q 30 hrs. Foot pumps applied bilaterally at 80 mm. Heels elevated on bed with rolled towels. No evidence of DVT. Negative Homan. Physical therapy started on day #1 for gait training and transfer. OT started day #1 for ADL and assisted devices.  Patient's foley was d/c on day #1. Patient's IV and hemovac was d/c on day #2.  On post op day #3 patient was stable and ready  for discharge to rehab facility.  Implants: Medacta GMK sphere left 3 femur to left tibia with 12 mm insert and 2 patella all components cemented  She was given perioperative antibiotics:  Anti-infectives    Start     Dose/Rate Route Frequency Ordered Stop   11/08/14 1215  ceFAZolin (ANCEF) IVPB 2 g/50 mL premix     2 g 100 mL/hr over 30 Minutes Intravenous Every 6 hours 11/08/14 1114 11/09/14 0014   11/08/14 1115  hydroxychloroquine (PLAQUENIL) tablet 200 mg     200 mg Oral 2 times daily 11/08/14 1114     11/08/14 0600  ceFAZolin (ANCEF) IVPB 2 g/50 mL premix  Status:  Discontinued     2 g 100 mL/hr over 30 Minutes Intravenous  Once 11/08/14 0559 11/08/14 1054   11/08/14 0555  ceFAZolin (ANCEF) 2-3 GM-% IVPB SOLR    Comments:  SCHNIER, JULIE: cabinet override      11/08/14 0555 11/08/14 0743    .  She was given sequential compression devices, early ambulation, and lovenox for DVT prophylaxis.  She benefited maximally from the hospital stay and there were no complications.    Recent vital signs:  Filed Vitals:   11/09/14 0750  BP: 141/51  Pulse: 74  Temp: 98.7 F (37.1 C)  Resp: 18    Recent laboratory studies:  Lab Results  Component Value Date   HGB 10.6* 11/09/2014   HGB 12.9 11/08/2014   HGB 12.2  10/25/2014   Lab Results  Component Value Date   WBC 8.8 11/09/2014   PLT 273 11/09/2014   Lab Results  Component Value Date   INR 0.89 10/25/2014   Lab Results  Component Value Date   NA 136 11/09/2014   K 4.0 11/09/2014   CL 102 11/09/2014   CO2 27 11/09/2014   BUN 22* 11/09/2014   CREATININE 0.90 11/09/2014   GLUCOSE 113* 11/09/2014    Discharge Medications:     Medication List    TAKE these medications        acetaminophen 650 MG CR tablet  Commonly known as:  TYLENOL  Take 650 mg by mouth 2 (two) times daily as needed for pain. 2 tablets orally twice a day     alum & mag hydroxide-simeth 200-200-20 MG/5ML suspension  Commonly known as:   MAALOX/MYLANTA  Take 30 mLs by mouth every 6 (six) hours as needed for indigestion or heartburn.     amitriptyline 25 MG tablet  Commonly known as:  ELAVIL  Take 25 mg by mouth at bedtime.     amLODipine 5 MG tablet  Commonly known as:  NORVASC  Take 5 mg by mouth daily.     bisacodyl 10 MG suppository  Commonly known as:  DULCOLAX  Place 1 suppository (10 mg total) rectally daily as needed (constipation if no results with MOM.).     CENTRUM SILVER ADULT 50+ PO  Take by mouth.     cetirizine 10 MG tablet  Commonly known as:  ZYRTEC  Take 10 mg by mouth daily. 1 tab orally  Once a day, As needed     ciprofloxacin 500 MG tablet  Commonly known as:  CIPRO  Take 1 tablet (500 mg total) by mouth 2 (two) times daily. Take for 3 more days.     diphenhydramine-acetaminophen 25-500 MG Tabs  Commonly known as:  TYLENOL PM  Take 1 tablet by mouth at bedtime as needed.     enoxaparin 40 MG/0.4ML injection  Commonly known as:  LOVENOX  Inject 0.4 mLs (40 mg total) into the skin daily. X 14 days     EPIPEN 0.3 mg/0.3 mL Soaj injection  Generic drug:  EPINEPHrine  Inject 0.3 mg into the muscle once as needed. Allergic reaction     fluticasone 50 MCG/ACT nasal spray  Commonly known as:  FLONASE  Place 1 spray into both nostrils daily.     Fluticasone Furoate-Vilanterol 100-25 MCG/INH Aepb  Inhale 1 puff into the lungs daily.     hydrochlorothiazide 12.5 MG capsule  Commonly known as:  MICROZIDE  Take 12.5 mg by mouth daily.     hydroxychloroquine 200 MG tablet  Commonly known as:  PLAQUENIL  Take 1 tablet (200 mg total) by mouth 2 (two) times daily.     losartan 100 MG tablet  Commonly known as:  COZAAR  Take 100 mg by mouth daily.     magnesium hydroxide 400 MG/5ML suspension  Commonly known as:  MILK OF MAGNESIA  Take 30 mLs by mouth 2 (two) times daily as needed (constipation).     metroNIDAZOLE 500 MG tablet  Commonly known as:  FLAGYL  Take 1 tablet (500 mg total)  by mouth every 8 (eight) hours. Take for 3 more days.     montelukast 10 MG tablet  Commonly known as:  SINGULAIR  Take 10 mg by mouth every evening.     NASONEX NA  Place 1 spray into the nose  every morning.     nebivolol 10 MG tablet  Commonly known as:  BYSTOLIC  Take 10 mg by mouth daily.     nitroGLYCERIN 0.4 MG SL tablet  Commonly known as:  NITROSTAT  Place 0.4 mg under the tongue every 5 (five) minutes as needed for chest pain.     NON FORMULARY  Apply topically 2 (two) times daily as needed. Triamcinolone acetonide cream     oseltamivir 30 MG capsule  Commonly known as:  TAMIFLU  Take 1 capsule (30 mg total) by mouth 2 (two) times daily. Take for 3-1/2 more days.     oxyCODONE 5 MG immediate release tablet  Commonly known as:  Oxy IR/ROXICODONE  Take 1-2 tablets (5-10 mg total) by mouth every 4 (four) hours as needed for moderate pain.     oxyCODONE 5 MG immediate release tablet  Commonly known as:  Oxy IR/ROXICODONE  Take 1-2 tablets (5-10 mg total) by mouth every 3 (three) hours as needed for breakthrough pain.     potassium chloride SA 20 MEQ tablet  Commonly known as:  K-DUR,KLOR-CON  Take 1 tablet (20 mEq total) by mouth 2 (two) times daily.     ranitidine 150 MG capsule  Commonly known as:  ZANTAC  Take 150 mg by mouth 2 (two) times daily.     senna-docusate 8.6-50 MG per tablet  Commonly known as:  Senokot-S  Take 1 tablet by mouth 2 (two) times daily.     simvastatin 20 MG tablet  Commonly known as:  ZOCOR  Take 20 mg by mouth at bedtime.     traMADol 50 MG tablet  Commonly known as:  ULTRAM  Take 1-2 tablets (50-100 mg total) by mouth every 6 (six) hours as needed (FOR moderate pain (4-6/10)).     triamcinolone cream 0.1 %  Commonly known as:  KENALOG  Apply 1 application topically 2 (two) times daily.        Diagnostic Studies: Dg Knee 1-2 Views Left  07-Dec-2014   CLINICAL DATA:  Status post total knee replacement.  EXAM: LEFT KNEE - 1-2  VIEW  COMPARISON:  CT scan of September 21, 2014.  FINDINGS: Status post left total knee arthroplasty. The femoral and tibial components appear to be well situated. No fracture or dislocation is noted. Midline surgical staples are noted anteriorly with expected amount of soft tissue gas.  IMPRESSION: Status post left total knee arthroplasty.   Electronically Signed   By: Lupita Raider, M.D.   On: Dec 07, 2014 10:10    Disposition: 03-Skilled Nursing Facility        Follow-up Information    Follow up with MENZ,MICHAEL, MD. Schedule an appointment as soon as possible for a visit in 2 weeks.   Specialty:  Orthopedic Surgery   Why:  For staple removal and skin check   Contact information:   [address] [place] [address] [phone]        Signed: Patience Musca 11/09/2014, 8:17 AM

## 2014-11-09 NOTE — Progress Notes (Signed)
Physical Therapy Treatment Patient Details Name: Rhonda Murphy MRN: [id number] DOB: [birth year] Today's Date: 11/09/2014    History of Present Illness admitted for acute hospitalization status post L TKR (11/08/24), WBAT.    PT Comments    Patient with improved pain control (6/10), improved activity tolerance and functional performance this date.  However, abrupt onset of dizziness/nausea after 40' distance requiring seated rest break (BP 104/64, HR 69).  Additional standing/gait deferred as a result.  RN informed/aware; will assess formal orthostatics in PM.   Follow Up Recommendations  SNF     Equipment Recommendations  None recommended by PT    Recommendations for Other Services       Precautions / Restrictions Precautions Precautions: Fall Restrictions Weight Bearing Restrictions: Yes LLE Weight Bearing: Weight bearing as tolerated    Mobility  Bed Mobility Overal bed mobility: Needs Assistance Bed Mobility: Supine to Sit     Supine to sit: Supervision     General bed mobility comments: self-assists L LE with bilat UEs over edge of bed  Transfers Overall transfer level: Needs assistance Equipment used: Rolling walker (2 wheeled) Transfers: Sit to/from Stand Sit to Stand: Min guard         General transfer comment: improved hand placement with movement transition; decreased weight shift/weight acceptance to L LE.  No reports of dizziness with initial transition to upright.  Ambulation/Gait Ambulation/Gait assistance: Min guard Ambulation Distance (Feet): 40 Feet Assistive device: Rolling walker (2 wheeled)       General Gait Details: step to progressing to step through gait pattern; improved L LE  step length with cuing from therapist.  Decreased L heel strike/toe off and subsequent knee flexion throughout gait cycle.  Onset of dizziness/nausea after 40' distance requiring seated rest break (BP 104/64, HR 69).  Additional standing/gait deferred as a  result.   Stairs            Wheelchair Mobility    Modified Rankin (Stroke Patients Only)       Balance                                    Cognition Arousal/Alertness: Awake/alert Behavior During Therapy: WFL for tasks assessed/performed Overall Cognitive Status: Within Functional Limits for tasks assessed                      Exercises Total Joint Exercises Goniometric ROM: L knee: 8-77 degrees, act assist ROM; limited by pain Other Exercises Other Exercises: LE seated therex, 1x15, AROM for muscular strength/endurance/ROM: ankle pumps, LAQs (emphasis on flex/ext), marching, hip abduct/adduct.    General Comments        Pertinent Vitals/Pain Pain Assessment: 0-10 Pain Score: 6  Pain Location: L knee Pain Descriptors / Indicators: Aching Pain Intervention(s): Limited activity within patient's tolerance;Monitored during session;Premedicated before session;Repositioned    Home Living Family/patient expects to be discharged to:: Skilled nursing facility Living Arrangements: Spouse/significant other Available Help at Discharge: Family Type of Home: House Home Access: Stairs to enter Entrance Stairs-Rails: Can reach both Home Layout: Multi-level;Able to live on main level with bedroom/bathroom Home Equipment: Dan Humphreys - 2 wheels;Bedside commode      Prior Function Level of Independence: Independent          PT Goals (current goals can now be found in the care plan section) Acute Rehab PT Goals Patient Stated Goal: "I want to go to rehab"  PT Goal Formulation: With patient/family Time For Goal Achievement: 11/22/14 Potential to Achieve Goals: Good Progress towards PT goals: Progressing toward goals    Frequency  BID    PT Plan Current plan remains appropriate    Co-evaluation             End of Session Equipment Utilized During Treatment: Gait belt Activity Tolerance: Patient limited by pain (limited by  orthostasis) Patient left: in chair;with call bell/phone within reach;with chair alarm set;with SCD's reapplied     Time: 3790-2409 PT Time Calculation (min) (ACUTE ONLY): 39 min  Charges:  $Gait Training: 8-22 mins $Therapeutic Exercise: 8-22 mins $Therapeutic Activity: 8-22 mins                    G Codes:      Callia Swim H. Manson Passey, PT, DPT, NCS 11/09/2014, 11:46 AM [phone]

## 2014-11-10 LAB — CBC
HCT: 33.6 % — ABNORMAL LOW (ref 35.0–47.0)
HEMOGLOBIN: 11.2 g/dL — AB (ref 12.0–16.0)
MCH: 29.1 pg (ref 26.0–34.0)
MCHC: 33.4 g/dL (ref 32.0–36.0)
MCV: 87.1 fL (ref 80.0–100.0)
Platelets: 279 10*3/uL (ref 150–440)
RBC: 3.86 MIL/uL (ref 3.80–5.20)
RDW: 13.5 % (ref 11.5–14.5)
WBC: 8.3 10*3/uL (ref 3.6–11.0)

## 2014-11-10 NOTE — Progress Notes (Signed)
Physical Therapy Treatment Patient Details Name: Rhonda Murphy MRN: [id number] DOB: 08-09-1945 Today's Date: 11/10/2014    History of Present Illness admitted for acute hospitalization status post L TKR (11/08/24), WBAT.    PT Comments    69 yo Female, reports increased pain at start of session this morning of 8/10. Patient reports not getting pain meds prior to session. RN notified. Patient was given pain meds and PT left from 8:50-10:00 AM to allow pain meds to take effect. Patient demonstrates decreased LLE knee AAROM, particularly in flexion due to increased stiffness and pain. She was able to progress gait training today, ambulating 100 feet with min A and min VCs for better gait technique. Patient would benefit from additional skilled PT intervention to reduce pain and return to PLOF.  Follow Up Recommendations  SNF     Equipment Recommendations  None recommended by PT    Recommendations for Other Services       Precautions / Restrictions Precautions Precautions: Fall Restrictions Weight Bearing Restrictions: Yes LLE Weight Bearing: Weight bearing as tolerated    Mobility  Bed Mobility Overal bed mobility: Needs Assistance Bed Mobility: Supine to Sit     Supine to sit: Supervision     General bed mobility comments: patient able to initiate LLE movement, but needs assistance to help hold LLE once sitting edge of bed; required min Vcs for hand placement and to scoot forward for less knee discomfort.  Transfers Overall transfer level: Needs assistance Equipment used: Rolling walker (2 wheeled) Transfers: Sit to/from Stand Sit to Stand: Min guard         General transfer comment: Demonstrates good hand placement;required min VCs to advance LLE forward for less discomfort; Patient reports dizziness, but dizziness is at baseline of start of treatment session; sit<>Stand from bed to chair x1 rep;  Ambulation/Gait Ambulation/Gait assistance: Min guard Ambulation  Distance (Feet): 100 Feet Assistive device: Rolling walker (2 wheeled) Gait Pattern/deviations: Step-through pattern;Decreased step length - left;Decreased stance time - left;Decreased dorsiflexion - right;Decreased dorsiflexion - left;Narrow base of support     General Gait Details: Patient ambulated with slow gait pattern; demonstrates slower gait speed and forward trunk flexion with increased fatigue; required mod VCs to increase step length, increase DF at heel strike for better foot clearance. She also required min Vcs to avoid postural flexion and reduce UE strain for less fatigue.   Stairs            Wheelchair Mobility    Modified Rankin (Stroke Patients Only)       Balance                                    Cognition Arousal/Alertness: Awake/alert Behavior During Therapy: WFL for tasks assessed/performed Overall Cognitive Status: Within Functional Limits for tasks assessed                      Exercises Total Joint Exercises Ankle Circles/Pumps: AROM;Both;15 reps Quad Sets: AROM;Strengthening;Both;10 reps Heel Slides: AAROM;Left;10 reps Goniometric ROM: LLE knee AAROM: 5 - 50; patient reports increased pain with AAROM knee flexion limiting ROM Other Exercises Other Exercises: Instructed patient in supine LE exercise to increase ROM and reduce stiffness. Patient required increased cues to hold quad set for 5 sec for better muscle activation/strengthening. Once seated in the chair, instructed patient to perform ankle pumps and SLR flexion throughout morning for increased strengthening.  General Comments        Pertinent Vitals/Pain Pain Assessment: 0-10 Pain Score: 4  Pain Location: left knee Pain Descriptors / Indicators: Aching;Sore Pain Intervention(s): Limited activity within patient's tolerance;Repositioned;RN gave pain meds during session    Home Living                      Prior Function            PT Goals  (current goals can now be found in the care plan section) Acute Rehab PT Goals Patient Stated Goal: "I want to go to rehab" PT Goal Formulation: With patient/family Time For Goal Achievement: 11/22/14 Potential to Achieve Goals: Good Progress towards PT goals: Progressing toward goals    Frequency  BID    PT Plan Current plan remains appropriate    Co-evaluation             End of Session Equipment Utilized During Treatment: Gait belt Activity Tolerance: Patient limited by pain Patient left: with call bell/phone within reach;in bed;with chair alarm set;with family/visitor present     Time: [phone] (PT worked with patient from 8:35-8:50AM and then left the room while she received pain meds. Returned at 10:00-10:12 for additional treatment. PT Time Calculation (min) (ACUTE ONLY): 97 min  Charges:  $Gait Training: 8-22 mins $Therapeutic Exercise: 8-22 mins                    G Codes:      Hopkins,Bobbijo Holst PT, DPT 11/10/2014, 10:54 AM

## 2014-11-10 NOTE — Progress Notes (Signed)
Physical Therapy Treatment Patient Details Name: Rhonda Murphy MRN: [id number] DOB: 12-Oct-1945 Today's Date: 11/10/2014    History of Present Illness admitted for acute hospitalization status post L TKR (11/08/24), WBAT.    PT Comments    Patient responded well to cues today with better LLE knee AROM tolerance with seated exercise. She is still limited with bed mobility and gait tasks. Patient is limited in mobility due to increased LLE knee pain. She required increased cues for better step length and posture with gait tasks. Patient very fatigued and reports increased stomach discomfort due to need for bowel movement. She would benefit from additional skilled PT Intervention to reduce pain, increase ROM/strength and improve functional mobility.   Follow Up Recommendations  SNF     Equipment Recommendations  None recommended by PT    Recommendations for Other Services       Precautions / Restrictions Precautions Precautions: Fall Restrictions Weight Bearing Restrictions: Yes LLE Weight Bearing: Weight bearing as tolerated    Mobility  Bed Mobility Overal bed mobility: Needs Assistance Bed Mobility: Supine to Sit;Sit to Supine     Supine to sit: Min assist Sit to supine: Min assist   General bed mobility comments: patient able to initiate LLE movement, but did require min A for holding LLE to help get in/out of bed. Patient had difficulty lifting LLE independently. Required min VCs for correct positioning for safety.   Transfers Overall transfer level: Needs assistance Equipment used: Rolling walker (2 wheeled) Transfers: Sit to/from Stand Sit to Stand: Supervision         General transfer comment: able transfer sit<>Stand from bed x1, from standard chair x1, from elevated commode x1 rep, supervision demonstrating better safety awareness and hand placement. Did require min Vcs to advance LLE forward for less knee discomfort.  Ambulation/Gait Ambulation/Gait  assistance: Min guard Ambulation Distance (Feet): 75 Feet Assistive device: Rolling walker (2 wheeled) Gait Pattern/deviations: Step-through pattern;Decreased step length - left;Decreased stance time - left;Decreased dorsiflexion - right;Decreased dorsiflexion - left Gait velocity: decreased   General Gait Details: Patient demonstrates increased forward flexed posture. Required mod VCs to increase erect posture, increase step length for reciprocal gait pattern. Patient ambulates at slower gait speed this afternoon as compared to this morning.    Stairs            Wheelchair Mobility    Modified Rankin (Stroke Patients Only)       Balance                                    Cognition Arousal/Alertness: Awake/alert Behavior During Therapy: WFL for tasks assessed/performed Overall Cognitive Status: Within Functional Limits for tasks assessed                      Exercises Total Joint Exercises Ankle Circles/Pumps: AROM;Both;15 reps Quad Sets: AROM;Strengthening;Both;10 reps Heel Slides: AAROM;Left;10 reps Goniometric ROM: LLE knee AAROM: 5 - 50; patient reports increased pain with AAROM knee flexion limiting ROM General Exercises - Lower Extremity Long Arc Quad: AROM;AAROM;Right;Left;15 reps;Seated (initially AAROM on LLE for LAQ but progressed to AROM) Hip ABduction/ADduction: Strengthening;Both;15 reps;Seated (pillow squeezes) Hip Flexion/Marching: Strengthening;Both;15 reps;Seated Toe Raises: AROM;Both;15 reps;Seated Heel Raises: AROM;Both;15 reps;Seated Other Exercises Other Exercises: Instructed patient in seated exercise. Patient initially required AAROM on LLE to initiate knee extension/flexion. She was able to progress to AROM through partial range of motion. Required min  Vcs to increase ROM and increase hold time for better strengthening.    General Comments        Pertinent Vitals/Pain Pain Assessment: 0-10 Pain Score: 7  Pain  Location: left knee Pain Descriptors / Indicators: Aching;Burning Pain Intervention(s): Limited activity within patient's tolerance;Premedicated before session;Repositioned;Ice applied    Home Living                      Prior Function            PT Goals (current goals can now be found in the care plan section) Acute Rehab PT Goals Patient Stated Goal: "I want to go to rehab" PT Goal Formulation: With patient/family Time For Goal Achievement: 11/22/14 Potential to Achieve Goals: Good Progress towards PT goals: Progressing toward goals    Frequency  BID    PT Plan Current plan remains appropriate    Co-evaluation             End of Session Equipment Utilized During Treatment: Gait belt Activity Tolerance: Patient limited by pain Patient left: with call bell/phone within reach;in bed;with chair alarm set;with family/visitor present     Time: 1331-1402 PT Time Calculation (min) (ACUTE ONLY): 31 min  Charges:  $Gait Training: 8-22 mins $Therapeutic Exercise: 8-22 mins                    G Codes:      Hopkins,Daniil Labarge, PT, DPT 11/10/2014, 2:45 PM

## 2014-11-10 NOTE — Progress Notes (Signed)
  Subjective: 2 Days Post-Op Procedure(s) (LRB): TOTAL KNEE ARTHROPLASTY (Left) Patient reports pain as mild.   Patient seen in rounds with Dr. Rosita Kea. Patient is well, and has had no acute complaints or problems Plan is to go to rehabilitation after hospital stay. The patient has a bed offer in [place]. Negative for chest pain and shortness of breath Fever: no Gastrointestinal: Negative for nausea and vomiting. The patient did have some nausea yesterday but is improving.  Objective: Vital signs in last 24 hours: Temp:  [98 F (36.7 C)-99.5 F (37.5 C)] 99.5 F (37.5 C) (08/27 0414) Pulse Rate:  [69-101] 101 (08/27 0414) Resp:  [18] 18 (08/27 0414) BP: (104-176)/(51-65) 164/65 mmHg (08/27 0414) SpO2:  [94 %-100 %] 94 % (08/27 0414)  Intake/Output from previous day:  Intake/Output Summary (Last 24 hours) at 11/10/14 0650 Last data filed at 11/10/14 0431  Gross per 24 hour  Intake   1895 ml  Output   1925 ml  Net    -30 ml    Intake/Output this shift: Total I/O In: 0  Out: 1400 [Urine:1400]  Labs:  Recent Labs  11/08/14 1153 11/09/14 0705 11/10/14 0401  HGB 12.9 10.6* 11.2*    Recent Labs  11/09/14 0705 11/10/14 0401  WBC 8.8 8.3  RBC 3.63* 3.86  HCT 31.7* 33.6*  PLT 273 279    Recent Labs  11/08/14 1153 11/09/14 0705  NA  --  136  K  --  4.0  CL  --  102  CO2  --  27  BUN  --  22*  CREATININE 0.87 0.90  GLUCOSE  --  113*  CALCIUM  --  8.5*   No results for input(s): LABPT, INR in the last 72 hours.   EXAM General - Patient is Alert and Oriented Extremity - Neurovascular intact Dorsiflexion/Plantar flexion intact New honeycomb dressing applied to the wound today. Dressing/Incision - clean, dry, no drainage Motor Function - intact, moving foot and toes well on exam. 40 feet of ambulation with physical therapy.  Past Medical History  Diagnosis Date  . Mitral valve prolapse   . Hypertension   . Acid reflux   . Asthma   .  Shingles   . C. difficile colitis   . Back pain   . Influenza A 03/24/2013  . Small bowel obstruction 03/24/2013    SBO vs. enteritis.   . Enteritis 03/21/2013  . LBBB (left bundle branch block) 03/21/2013  . Asthma     Assessment/Plan: 2 Days Post-Op Procedure(s) (LRB): TOTAL KNEE ARTHROPLASTY (Left) Active Problems:   Primary osteoarthritis of knee  Estimated body mass index is 31.45 kg/(m^2) as calculated from the following:   Height as of this encounter:  (1.575 m).   Weight as of this encounter: 78.019 kg (172 lb). Discharge to SNF tomorrow  DVT Prophylaxis - Lovenox, Foot Pumps and TED hose Weight-Bearing as tolerated to left leg  Dedra Skeens, PA-C Orthopaedic Surgery 11/10/2014, 6:50 AM

## 2014-11-10 NOTE — Plan of Care (Signed)
Problem: Consults Goal: Diagnosis- Total Joint Replacement Outcome: Completed/Met Date Met:  11/10/14 Primary Total Knee

## 2014-11-10 NOTE — Progress Notes (Signed)
Occupational Therapy Treatment Patient Details Name: Rhonda Murphy MRN: [id number] DOB: 23-May-1945 Today's Date: 11/10/2014    History of present illness admitted for acute hospitalization status post L TKR (11/08/24), WBAT.   OT comments  Patient up in recliner when OT arrived. Family at bedside. Patient educated in use of AE for lower body dressing. Able to perform with Min verbal cues using AE.    Follow Up Recommendations       Equipment Recommendations       Recommendations for Other Services      Precautions / Restrictions Precautions Precautions: Fall Restrictions Weight Bearing Restrictions: Yes LLE Weight Bearing: Weight bearing as tolerated       Mobility Bed Mobility Overal bed mobility: Needs Assistance Bed Mobility: Supine to Sit     Supine to sit: Supervision     General bed mobility comments: patient able to initiate LLE movement, but needs assistance to help hold LLE once sitting edge of bed; required min Vcs for hand placement and to scoot forward for less knee discomfort.  Transfers Overall transfer level: Needs assistance Equipment used: Rolling walker (2 wheeled) Transfers: Sit to/from Stand Sit to Stand: Min guard         General transfer comment: Demonstrates good hand placement;required min VCs to advance LLE forward for less discomfort; Patient reports dizziness, but dizziness is at baseline of start of treatment session; sit<>Stand from bed to chair x1 rep;    Balance                                   ADL                                         General ADL Comments: Patient had been independent before surgery.      Vision                     Perception     Praxis      Cognition   Behavior During Therapy: [facility] for tasks assessed/performed Overall Cognitive Status: Within Functional Limits for tasks assessed                       Extremity/Trunk Assessment                Exercises Total Joint Exercises Ankle Circles/Pumps: AROM;Both;15 reps Quad Sets: AROM;Strengthening;Both;10 reps Heel Slides: AAROM;Left;10 reps Goniometric ROM: LLE knee AAROM: 5 - 50; patient reports increased pain with AAROM knee flexion limiting ROM Other Exercises Other Exercises: Instructed patient in supine LE exercise to increase ROM and reduce stiffness. Patient required increased cues to hold quad set for 5 sec for better muscle activation/strengthening. Once seated in the chair, instructed patient to perform ankle pumps and SLR flexion throughout morning for increased strengthening.   Shoulder Instructions       General Comments      Pertinent Vitals/ Pain       Pain Assessment: 0-10 Pain Score: 3  Pain Location: left knee Pain Descriptors / Indicators: Aching;Sore Pain Intervention(s): Monitored during session;Limited activity within patient's tolerance  Home Living  Prior Functioning/Environment              Frequency Min 1X/week     Progress Toward Goals  OT Goals(current goals can now be found in the care plan section)  Progress towards OT goals: Progressing toward goals  Acute Rehab OT Goals Patient Stated Goal: "I want to go to rehab" Time For Goal Achievement: 11/23/14 Potential to Achieve Goals: Good  Plan Discharge plan remains appropriate    Co-evaluation                 End of Session     Activity Tolerance Patient tolerated treatment well   Patient Left in chair   Nurse Communication          Time: 1125-1150 OT Time Calculation (min): 25 min  Charges: OT General Charges $OT Visit: 1 Procedure OT Treatments $Self Care/Home Management : 23-37 mins  Onnie Alatorre L 11/10/2014, 11:53 AM Zaineb Nowaczyk L, OT

## 2014-11-11 NOTE — Progress Notes (Signed)
  Subjective: 3 Days Post-Op Procedure(s) (LRB): TOTAL KNEE ARTHROPLASTY (Left) Patient reports pain as mild.   Patient seen in rounds with Dr. Rosita Kea. Patient is well, and has had no acute complaints or problems Plan is to go to rehabilitation after hospital stay. Negative for chest pain and shortness of breath Fever: no Gastrointestinal: Negative for nausea and vomiting  Objective: Vital signs in last 24 hours: Temp:  [98.3 F (36.8 C)-99.9 F (37.7 C)] 98.3 F (36.8 C) (08/28 0542) Pulse Rate:  [72-104] 72 (08/28 0542) Resp:  [18] 18 (08/28 0542) BP: (138-164)/(51-68) 138/51 mmHg (08/28 0542) SpO2:  [90 %-99 %] 90 % (08/28 0542)  Intake/Output from previous day:  Intake/Output Summary (Last 24 hours) at 11/11/14 0628 Last data filed at 11/10/14 1847  Gross per 24 hour  Intake    600 ml  Output    300 ml  Net    300 ml    Intake/Output this shift:    Labs:  Recent Labs  11/08/14 1153 11/09/14 0705 11/10/14 0401  HGB 12.9 10.6* 11.2*    Recent Labs  11/09/14 0705 11/10/14 0401  WBC 8.8 8.3  RBC 3.63* 3.86  HCT 31.7* 33.6*  PLT 273 279    Recent Labs  11/08/14 1153 11/09/14 0705  NA  --  136  K  --  4.0  CL  --  102  CO2  --  27  BUN  --  22*  CREATININE 0.87 0.90  GLUCOSE  --  113*  CALCIUM  --  8.5*   No results for input(s): LABPT, INR in the last 72 hours.   EXAM General - Patient is Alert and Oriented Extremity - Neurovascular intact Dorsiflexion/Plantar flexion intact No cellulitis present Compartment soft Dressing/Incision - dry, blood tinged drainage Motor Function - intact, moving foot and toes well on exam. The patient is ambulating 75 feet and independently to the bathroom.  Past Medical History  Diagnosis Date  . Mitral valve prolapse   . Hypertension   . Acid reflux   . Asthma   . Shingles   . C. difficile colitis   . Back pain   . Influenza A 03/24/2013  . Small bowel obstruction 03/24/2013    SBO vs. enteritis.   .  Enteritis 03/21/2013  . LBBB (left bundle branch block) 03/21/2013  . Asthma     Assessment/Plan: 3 Days Post-Op Procedure(s) (LRB): TOTAL KNEE ARTHROPLASTY (Left) Active Problems:   Primary osteoarthritis of knee  Estimated body mass index is 31.45 kg/(m^2) as calculated from the following:   Height as of this encounter:  (1.575 m).   Weight as of this encounter: 78.019 kg (172 lb). Discharge to SNF  DVT Prophylaxis - Lovenox, Foot Pumps and TED hose Weight-Bearing as tolerated to left leg  Dedra Skeens, PA-C Orthopaedic Surgery 11/11/2014, 6:28 AM

## 2014-11-11 NOTE — Clinical Social Work Note (Signed)
Patient has been discharged as planned to go to [facility] in [place], [place]. Summary faxed on Friday and nurse to call report today. Husband to transport. CSW has left message for the Admissions Coordinator: Jorene Guest: [phone] to notify that discharge is occuring as planned. York Spaniel MSW,LCSW [phone]

## 2014-11-11 NOTE — Progress Notes (Signed)
Pt. Alert and oriented. VSS. Pain controlled with PO pain meds. Up to bathroom with stand-by assist. Tolerating PO's. For d/c today to rehab. Will continue to monitor.

## 2014-11-11 NOTE — Clinical Social Work Placement (Signed)
   CLINICAL SOCIAL WORK PLACEMENT  NOTE  Date:  11/11/2014  Patient Details  Name: Rhonda Murphy MRN: [id number] Date of Birth: 1946-02-24  Clinical Social Work is seeking post-discharge placement for this patient at the Skilled  Nursing Facility level of care (*CSW will initial, date and re-position this form in  chart as items are completed):  Yes   Patient/family provided with [facility] Clinical Social Work Department's list of facilities offering this level of care within the geographic area requested by the patient (or if unable, by the patient's family).  Yes   Patient/family informed of their freedom to choose among providers that offer the needed level of care, that participate in Medicare, Medicaid or managed care program needed by the patient, have an available bed and are willing to accept the patient.      Patient/family informed of [facility]'s ownership interest in [facility] and [facility], as well as of the fact that they are under no obligation to receive care at these facilities.  PASRR submitted to [employer] on  (Patient does not require a PASARR going to a SNF in [place]. )     PASRR number received on       Existing PASRR number confirmed on       FL2 transmitted to all facilities in geographic area requested by pt/family on 11/08/14     FL2 transmitted to all facilities within larger geographic area on       Patient informed that his/her managed care company has contracts with or will negotiate with certain facilities, including the following:        Yes   Patient/family informed of bed offers received.  Patient chooses bed at  [facility] [place], [place]) )     Physician recommends and patient chooses bed at  [facility])    Patient to be transferred to  [facility]) on 11/11/14.  Patient to be transferred to facility by  (family car)     Patient family notified on 11/11/14 of transfer.  Name of family member notified:   (patient and husband)      PHYSICIAN Please sign FL2     Additional Comment:    _______________________________________________ York Spaniel, LCSW 11/11/2014, 8:09 AM

## 2014-11-11 NOTE — Progress Notes (Signed)
[facility], report called to [place] at facility. IV removed. Waiting on family for transportation.

## 2014-11-11 NOTE — Progress Notes (Signed)
Physical Therapy Treatment Patient Details Name: Rhonda Murphy MRN: [id number] DOB: [birth year] Today's Date: 11/11/2014    History of Present Illness admitted for acute hospitalization status post L TKR (11/08/24), WBAT.    PT Comments    Pt recovering well post TKA.  She still lacks full TKE and is guarded with WBing on L during ambulation but generally is doing well and improving consistently.  Follow Up Recommendations  SNF     Equipment Recommendations       Recommendations for Other Services       Precautions / Restrictions Precautions Precautions: Fall Restrictions LLE Weight Bearing: Weight bearing as tolerated    Mobility  Bed Mobility               General bed mobility comments: Pt already sitting on arrival  Transfers Overall transfer level: Needs assistance Equipment used: Rolling walker (2 wheeled) Transfers: Sit to/from Stand Sit to Stand: Supervision            Ambulation/Gait Ambulation/Gait assistance: Min guard Ambulation Distance (Feet): 60 Feet Assistive device: Rolling walker (2 wheeled)       General Gait Details: Pt with slow, deliberate ambulation.  L knee struggles to get to [facility], she is reliant on the walker.    Stairs            Wheelchair Mobility    Modified Rankin (Stroke Patients Only)       Balance                                    Cognition Arousal/Alertness: Awake/alert Behavior During Therapy: WFL for tasks assessed/performed Overall Cognitive Status: Within Functional Limits for tasks assessed                      Exercises Total Joint Exercises Ankle Circles/Pumps: AROM;Both;15 reps Quad Sets: AROM;Strengthening;Both;10 reps Heel Slides: AAROM;Left;10 reps Hip ABduction/ADduction: Strengthening;Both;10 reps;Supine Long Arc Quad: 10 reps;AROM    General Comments        Pertinent Vitals/Pain Pain Assessment:  (not rated, moderate pain)    Home Living                       Prior Function            PT Goals (current goals can now be found in the care plan section) Progress towards PT goals: Progressing toward goals    Frequency  BID    PT Plan Current plan remains appropriate    Co-evaluation             End of Session Equipment Utilized During Treatment: Gait belt Activity Tolerance: Patient limited by pain Patient left: with bed alarm set     Time: 0920-0946 PT Time Calculation (min) (ACUTE ONLY): 26 min  Charges:  $Gait Training: 8-22 mins $Therapeutic Exercise: 8-22 mins                    G Codes:     Rhonda Murphy, PT, DPT [phone]   Rhonda Murphy 11/11/2014, 11:32 AM

## 2014-11-13 LAB — SURGICAL PATHOLOGY

## 2015-04-09 ENCOUNTER — Encounter
Admission: RE | Admit: 2015-04-09 | Discharge: 2015-04-09 | Disposition: A | Discharge status: Home / Self Care | Payer: Medicare Other | Source: Ambulatory Visit | Attending: Orthopedic Surgery | Admitting: Orthopedic Surgery

## 2015-04-09 DIAGNOSIS — Z01812 Encounter for preprocedural laboratory examination: Secondary | ICD-10-CM | POA: Insufficient documentation

## 2015-04-09 HISTORY — DX: Unspecified osteoarthritis, unspecified site: M19.90

## 2015-04-09 LAB — CBC
HCT: 41.5 % (ref 35.0–47.0)
HEMOGLOBIN: 13.6 g/dL (ref 12.0–16.0)
MCH: 29 pg (ref 26.0–34.0)
MCHC: 32.7 g/dL (ref 32.0–36.0)
MCV: 88.6 fL (ref 80.0–100.0)
PLATELETS: 325 10*3/uL (ref 150–440)
RBC: 4.69 MIL/uL (ref 3.80–5.20)
RDW: 13.8 % (ref 11.5–14.5)
WBC: 8.7 10*3/uL (ref 3.6–11.0)

## 2015-04-09 LAB — URINALYSIS COMPLETE WITH MICROSCOPIC (ARMC ONLY)
Bilirubin Urine: NEGATIVE
Glucose, UA: NEGATIVE mg/dL
HGB URINE DIPSTICK: NEGATIVE
KETONES UR: NEGATIVE mg/dL
Nitrite: POSITIVE — AB
PH: 6 (ref 5.0–8.0)
Protein, ur: NEGATIVE mg/dL
Specific Gravity, Urine: 1.008 (ref 1.005–1.030)
Squamous Epithelial / LPF: NONE SEEN

## 2015-04-09 LAB — APTT: aPTT: 33 seconds (ref 24–36)

## 2015-04-09 LAB — BASIC METABOLIC PANEL
ANION GAP: 10 (ref 5–15)
BUN: 23 mg/dL — ABNORMAL HIGH (ref 6–20)
CALCIUM: 9.7 mg/dL (ref 8.9–10.3)
CHLORIDE: 102 mmol/L (ref 101–111)
CO2: 27 mmol/L (ref 22–32)
Creatinine, Ser: 0.97 mg/dL (ref 0.44–1.00)
GFR calc non Af Amer: 57 mL/min — ABNORMAL LOW (ref >60)
GLUCOSE: 81 mg/dL (ref 65–99)
Potassium: 3.2 mmol/L — ABNORMAL LOW (ref 3.5–5.1)
Sodium: 139 mmol/L (ref 135–145)

## 2015-04-09 LAB — TYPE AND SCREEN
ABO/RH(D): A POS
ANTIBODY SCREEN: NEGATIVE

## 2015-04-09 LAB — PROTIME-INR
INR: 0.94
Prothrombin Time: 12.8 seconds (ref 11.4–15.0)

## 2015-04-09 LAB — SEDIMENTATION RATE: SED RATE: 23 mm/h (ref 0–30)

## 2015-04-09 LAB — SURGICAL PCR SCREEN
MRSA, PCR: NEGATIVE
STAPHYLOCOCCUS AUREUS: POSITIVE — AB

## 2015-04-09 NOTE — Patient Instructions (Signed)
  Your procedure is scheduled on: Tuesday 04/16/2015 Report to Day Surgery. 2ND FLOOR MEDICAL MALL ENTRANCE To find out your arrival time please call [phone] between 1PM - 3PM on Monday 04/15/2015.  Remember: Instructions that are not followed completely may result in serious medical risk, up to and including death, or upon the discretion of your surgeon and anesthesiologist your surgery may need to be rescheduled.    __X__ 1. Do not eat food or drink liquids after midnight. No gum chewing or hard candies.     ___X_ 2. No Alcohol for 24 hours before or after surgery.   ____ 3. Bring all medications with you on the day of surgery if instructed.    __X__ 4. Notify your doctor if there is any change in your medical condition     (cold, fever, infections).     Do not wear jewelry, make-up, hairpins, clips or nail polish.  Do not wear lotions, powders, or perfumes.   Do not shave 48 hours prior to surgery. Men may shave face and neck.  Do not bring valuables to the hospital.    [facility] is not responsible for any belongings or valuables.               Contacts, dentures or bridgework may not be worn into surgery.  Leave your suitcase in the car. After surgery it may be brought to your room.  For patients admitted to the hospital, discharge time is determined by your                treatment team.   Patients discharged the day of surgery will not be allowed to drive home.   Please read over the following fact sheets that you were given:   MRSA Information and Surgical Site Infection Prevention   __X__ Take these medicines the morning of surgery with A SIP OF WATER:    1. AMLODIPINE  2. ZYRTEC  3. LOSARTAN  4. BYSTOLIC  5. RANITIDINE  6.  ____ Fleet Enema (as directed)   __X__ Use CHG Soap as directed  __X__ Use inhalers on the day of surgery  ____ Stop metformin 2 days prior to surgery    ____ Take 1/2 of usual insulin dose the night before surgery and none on the  morning of surgery.   ____ Stop Coumadin/Plavix/aspirin on   __X__ Stop Anti-inflammatories on TODAY STOP VOLTAREN NO OTHER ADVIL OR ALEVE EITHER   __X__ Stop supplements until after surgery.    ____ Bring C-Pap to the hospital.

## 2015-04-10 NOTE — Pre-Procedure Instructions (Signed)
Message left on [facility] voicemail regarding Positive Staph Aureus result being sent via fax.

## 2015-04-11 LAB — URINE CULTURE

## 2015-04-11 NOTE — Pre-Procedure Instructions (Signed)
Re-faxed/telephone notification  regarding patients UA and K+ to Dr Neomia Glass office receipt confirmation received. Repeat K+ ordered for DOS.

## 2015-04-16 ENCOUNTER — Inpatient Hospital Stay: Payer: Medicare Other | Admitting: Anesthesiology

## 2015-04-16 ENCOUNTER — Encounter
Admission: RE | Disposition: A | Discharge status: Home Health | Payer: Self-pay | Source: Ambulatory Visit | Attending: Orthopedic Surgery

## 2015-04-16 ENCOUNTER — Encounter: Payer: Self-pay | Admitting: Anesthesiology

## 2015-04-16 ENCOUNTER — Inpatient Hospital Stay
Admission: RE | Admit: 2015-04-16 | Discharge: 2015-04-19 | DRG: 468 | Disposition: A | Discharge status: Home Health | Payer: Medicare Other | Source: Ambulatory Visit | Attending: Orthopedic Surgery | Admitting: Orthopedic Surgery

## 2015-04-16 ENCOUNTER — Inpatient Hospital Stay: Payer: Medicare Other

## 2015-04-16 DIAGNOSIS — I447 Left bundle-branch block, unspecified: Secondary | ICD-10-CM | POA: Diagnosis present

## 2015-04-16 DIAGNOSIS — K219 Gastro-esophageal reflux disease without esophagitis: Secondary | ICD-10-CM | POA: Diagnosis present

## 2015-04-16 DIAGNOSIS — Y828 Other medical devices associated with adverse incidents: Secondary | ICD-10-CM | POA: Diagnosis present

## 2015-04-16 DIAGNOSIS — R0902 Hypoxemia: Secondary | ICD-10-CM

## 2015-04-16 DIAGNOSIS — Z885 Allergy status to narcotic agent status: Secondary | ICD-10-CM

## 2015-04-16 DIAGNOSIS — I341 Nonrheumatic mitral (valve) prolapse: Secondary | ICD-10-CM | POA: Diagnosis present

## 2015-04-16 DIAGNOSIS — Z882 Allergy status to sulfonamides status: Secondary | ICD-10-CM | POA: Diagnosis not present

## 2015-04-16 DIAGNOSIS — I10 Essential (primary) hypertension: Secondary | ICD-10-CM | POA: Diagnosis present

## 2015-04-16 DIAGNOSIS — Z96652 Presence of left artificial knee joint: Secondary | ICD-10-CM | POA: Diagnosis present

## 2015-04-16 DIAGNOSIS — E785 Hyperlipidemia, unspecified: Secondary | ICD-10-CM | POA: Diagnosis present

## 2015-04-16 DIAGNOSIS — Y792 Prosthetic and other implants, materials and accessory orthopedic devices associated with adverse incidents: Secondary | ICD-10-CM | POA: Diagnosis present

## 2015-04-16 DIAGNOSIS — Z888 Allergy status to other drugs, medicaments and biological substances status: Secondary | ICD-10-CM

## 2015-04-16 DIAGNOSIS — Z881 Allergy status to other antibiotic agents status: Secondary | ICD-10-CM

## 2015-04-16 DIAGNOSIS — Z6836 Body mass index (BMI) 36.0-36.9, adult: Secondary | ICD-10-CM

## 2015-04-16 DIAGNOSIS — J45909 Unspecified asthma, uncomplicated: Secondary | ICD-10-CM | POA: Diagnosis present

## 2015-04-16 DIAGNOSIS — T84093A Other mechanical complication of internal left knee prosthesis, initial encounter: Secondary | ICD-10-CM

## 2015-04-16 DIAGNOSIS — E669 Obesity, unspecified: Secondary | ICD-10-CM | POA: Diagnosis present

## 2015-04-16 DIAGNOSIS — Y838 Other surgical procedures as the cause of abnormal reaction of the patient, or of later complication, without mention of misadventure at the time of the procedure: Secondary | ICD-10-CM | POA: Diagnosis present

## 2015-04-16 DIAGNOSIS — Z887 Allergy status to serum and vaccine status: Secondary | ICD-10-CM

## 2015-04-16 DIAGNOSIS — T84033A Mechanical loosening of internal left knee prosthetic joint, initial encounter: Principal | ICD-10-CM | POA: Diagnosis present

## 2015-04-16 DIAGNOSIS — G8918 Other acute postprocedural pain: Secondary | ICD-10-CM

## 2015-04-16 HISTORY — PX: TOTAL KNEE REVISION: SHX996

## 2015-04-16 LAB — CBC
HCT: 34 % — ABNORMAL LOW (ref 35.0–47.0)
Hemoglobin: 11.1 g/dL — ABNORMAL LOW (ref 12.0–16.0)
MCH: 29.2 pg (ref 26.0–34.0)
MCHC: 32.5 g/dL (ref 32.0–36.0)
MCV: 89.7 fL (ref 80.0–100.0)
PLATELETS: 222 10*3/uL (ref 150–440)
RBC: 3.79 MIL/uL — ABNORMAL LOW (ref 3.80–5.20)
RDW: 13.2 % (ref 11.5–14.5)
WBC: 7.7 10*3/uL (ref 3.6–11.0)

## 2015-04-16 LAB — CREATININE, SERUM: CREATININE: 0.84 mg/dL (ref 0.44–1.00)

## 2015-04-16 LAB — POCT I-STAT 4, (NA,K, GLUC, HGB,HCT)
Glucose, Bld: 95 mg/dL (ref 65–99)
HCT: 38 % (ref 36.0–46.0)
Hemoglobin: 12.9 g/dL (ref 12.0–15.0)
POTASSIUM: 4.4 mmol/L (ref 3.5–5.1)
SODIUM: 137 mmol/L (ref 135–145)

## 2015-04-16 SURGERY — TOTAL KNEE REVISION
Anesthesia: Spinal | Site: Knee | Laterality: Left | Wound class: Clean

## 2015-04-16 MED ORDER — MAGNESIUM HYDROXIDE 400 MG/5ML PO SUSP
30.0000 mL | Freq: Every day | ORAL | Status: DC | PRN
  Administered 2015-04-17: 30 mL via ORAL
  Filled 2015-04-16: qty 30

## 2015-04-16 MED ORDER — ENOXAPARIN SODIUM 30 MG/0.3ML ~~LOC~~ SOLN
30.0000 mg | Freq: Two times a day (BID) | SUBCUTANEOUS | Status: DC
  Administered 2015-04-17 – 2015-04-19 (×5): 30 mg via SUBCUTANEOUS
  Filled 2015-04-16 (×6): qty 0.3

## 2015-04-16 MED ORDER — KETAMINE HCL 10 MG/ML IJ SOLN
INTRAMUSCULAR | Status: DC | PRN
  Administered 2015-04-16: 30 mg via INTRAVENOUS

## 2015-04-16 MED ORDER — TRIAMCINOLONE ACETONIDE 0.1 % EX CREA
1.0000 "application " | TOPICAL_CREAM | Freq: Two times a day (BID) | CUTANEOUS | Status: DC
  Administered 2015-04-16 – 2015-04-18 (×6): 1 via TOPICAL
  Filled 2015-04-16: qty 15

## 2015-04-16 MED ORDER — ONDANSETRON HCL 4 MG/2ML IJ SOLN
INTRAMUSCULAR | Status: AC
  Administered 2015-04-16: 4 mg via INTRAVENOUS
  Filled 2015-04-16: qty 2

## 2015-04-16 MED ORDER — NEOMYCIN-POLYMYXIN B GU 40-200000 IR SOLN
Status: AC
  Filled 2015-04-16: qty 20

## 2015-04-16 MED ORDER — MAGNESIUM CITRATE PO SOLN
1.0000 | Freq: Once | ORAL | Status: DC | PRN
  Filled 2015-04-16: qty 296

## 2015-04-16 MED ORDER — HYDROXYCHLOROQUINE SULFATE 200 MG PO TABS
200.0000 mg | ORAL_TABLET | Freq: Two times a day (BID) | ORAL | Status: DC
  Administered 2015-04-16 – 2015-04-19 (×6): 200 mg via ORAL
  Filled 2015-04-16 (×6): qty 1

## 2015-04-16 MED ORDER — RISAQUAD PO CAPS
1.0000 | ORAL_CAPSULE | Freq: Every day | ORAL | Status: DC
  Administered 2015-04-17 – 2015-04-19 (×3): 1 via ORAL
  Filled 2015-04-16 (×3): qty 1

## 2015-04-16 MED ORDER — VANCOMYCIN HCL 500 MG IV SOLR
500.0000 mg | Freq: Once | INTRAVENOUS | Status: AC
  Administered 2015-04-16: 500 mg via INTRAVENOUS
  Filled 2015-04-16: qty 500

## 2015-04-16 MED ORDER — METOCLOPRAMIDE HCL 5 MG PO TABS
5.0000 mg | ORAL_TABLET | Freq: Three times a day (TID) | ORAL | Status: DC | PRN
  Administered 2015-04-16: 5 mg via ORAL
  Filled 2015-04-16: qty 1

## 2015-04-16 MED ORDER — FENTANYL CITRATE (PF) 100 MCG/2ML IJ SOLN
INTRAMUSCULAR | Status: AC
  Administered 2015-04-16: 25 ug via INTRAVENOUS
  Filled 2015-04-16: qty 2

## 2015-04-16 MED ORDER — CEFAZOLIN SODIUM-DEXTROSE 2-3 GM-% IV SOLR
2.0000 g | Freq: Four times a day (QID) | INTRAVENOUS | Status: AC
  Administered 2015-04-16 – 2015-04-17 (×3): 2 g via INTRAVENOUS
  Filled 2015-04-16 (×3): qty 50

## 2015-04-16 MED ORDER — HYDROMORPHONE HCL 1 MG/ML IJ SOLN
0.5000 mg | INTRAMUSCULAR | Status: DC | PRN
  Administered 2015-04-17: 0.5 mg via INTRAVENOUS
  Filled 2015-04-16 (×2): qty 1

## 2015-04-16 MED ORDER — GUAIFENESIN ER 600 MG PO TB12: 600.0000 mg | ORAL_TABLET | Freq: Two times a day (BID) | ORAL | Status: DC | PRN

## 2015-04-16 MED ORDER — ADULT MULTIVITAMIN W/MINERALS CH
1.0000 | ORAL_TABLET | Freq: Every morning | ORAL | Status: DC
  Administered 2015-04-17 – 2015-04-19 (×3): 1 via ORAL
  Filled 2015-04-16 (×3): qty 1

## 2015-04-16 MED ORDER — CEFAZOLIN SODIUM-DEXTROSE 2-3 GM-% IV SOLR
2.0000 g | Freq: Once | INTRAVENOUS | Status: AC
  Administered 2015-04-16: 2 g via INTRAVENOUS

## 2015-04-16 MED ORDER — POTASSIUM CHLORIDE CRYS ER 10 MEQ PO TBCR
10.0000 meq | EXTENDED_RELEASE_TABLET | Freq: Every day | ORAL | Status: DC
  Administered 2015-04-17 – 2015-04-19 (×3): 10 meq via ORAL
  Filled 2015-04-16 (×3): qty 1

## 2015-04-16 MED ORDER — LORATADINE 10 MG PO TABS
10.0000 mg | ORAL_TABLET | Freq: Every day | ORAL | Status: DC
  Administered 2015-04-17 – 2015-04-19 (×3): 10 mg via ORAL
  Filled 2015-04-16 (×3): qty 1

## 2015-04-16 MED ORDER — SIMVASTATIN 40 MG PO TABS
40.0000 mg | ORAL_TABLET | Freq: Every evening | ORAL | Status: DC
  Administered 2015-04-16 – 2015-04-17 (×2): 40 mg via ORAL
  Filled 2015-04-16 (×2): qty 1

## 2015-04-16 MED ORDER — METOCLOPRAMIDE HCL 5 MG/ML IJ SOLN: 5.0000 mg | Freq: Three times a day (TID) | INTRAMUSCULAR | Status: DC | PRN

## 2015-04-16 MED ORDER — ACETAMINOPHEN 325 MG PO TABS
650.0000 mg | ORAL_TABLET | Freq: Four times a day (QID) | ORAL | Status: DC | PRN
  Administered 2015-04-17: 650 mg via ORAL
  Filled 2015-04-16: qty 2

## 2015-04-16 MED ORDER — HYDROCHLOROTHIAZIDE 12.5 MG PO CAPS
12.5000 mg | ORAL_CAPSULE | Freq: Every day | ORAL | Status: DC
Start: 2015-04-17 — End: 2015-04-19
  Administered 2015-04-17 – 2015-04-19 (×3): 12.5 mg via ORAL
  Filled 2015-04-16 (×3): qty 1

## 2015-04-16 MED ORDER — CHLORHEXIDINE GLUCONATE CLOTH 2 % EX PADS
6.0000 | MEDICATED_PAD | Freq: Every day | CUTANEOUS | Status: DC
  Administered 2015-04-16 – 2015-04-19 (×4): 6 via TOPICAL

## 2015-04-16 MED ORDER — BUPIVACAINE LIPOSOME 1.3 % IJ SUSP
INTRAMUSCULAR | Status: AC
  Filled 2015-04-16: qty 20

## 2015-04-16 MED ORDER — MOMETASONE FURO-FORMOTEROL FUM 100-5 MCG/ACT IN AERO
2.0000 | INHALATION_SPRAY | Freq: Two times a day (BID) | RESPIRATORY_TRACT | Status: DC
  Administered 2015-04-16 – 2015-04-19 (×6): 2 via RESPIRATORY_TRACT
  Filled 2015-04-16: qty 8.8

## 2015-04-16 MED ORDER — MUCINEX DM 30-600 MG PO TB12: 1.0000 | ORAL_TABLET | Freq: Two times a day (BID) | ORAL | Status: DC | PRN

## 2015-04-16 MED ORDER — FAMOTIDINE 20 MG PO TABS
20.0000 mg | ORAL_TABLET | Freq: Every day | ORAL | Status: DC
  Administered 2015-04-16 – 2015-04-19 (×4): 20 mg via ORAL
  Filled 2015-04-16 (×4): qty 1

## 2015-04-16 MED ORDER — MIDAZOLAM HCL 5 MG/5ML IJ SOLN
INTRAMUSCULAR | Status: DC | PRN
  Administered 2015-04-16: 2 mg via INTRAVENOUS

## 2015-04-16 MED ORDER — PHENOL 1.4 % MT LIQD
1.0000 | OROMUCOSAL | Status: DC | PRN
  Filled 2015-04-16: qty 177

## 2015-04-16 MED ORDER — FLUTICASONE PROPIONATE 50 MCG/ACT NA SUSP
1.0000 | Freq: Every day | NASAL | Status: DC
  Administered 2015-04-17 – 2015-04-19 (×3): 1 via NASAL
  Filled 2015-04-16: qty 16

## 2015-04-16 MED ORDER — MORPHINE SULFATE (PF) 10 MG/ML IV SOLN
INTRAVENOUS | Status: AC
  Filled 2015-04-16: qty 1

## 2015-04-16 MED ORDER — ONDANSETRON HCL 4 MG/2ML IJ SOLN
4.0000 mg | Freq: Four times a day (QID) | INTRAMUSCULAR | Status: DC | PRN
  Administered 2015-04-16: 4 mg via INTRAVENOUS
  Filled 2015-04-16: qty 2

## 2015-04-16 MED ORDER — PROPOFOL 500 MG/50ML IV EMUL
INTRAVENOUS | Status: DC | PRN
  Administered 2015-04-16: 100 ug/kg/min via INTRAVENOUS

## 2015-04-16 MED ORDER — SODIUM CHLORIDE 0.9 % IV SOLN
10000.0000 ug | INTRAVENOUS | Status: DC | PRN
  Administered 2015-04-16: 40 ug/min via INTRAVENOUS

## 2015-04-16 MED ORDER — NEBIVOLOL HCL 10 MG PO TABS
10.0000 mg | ORAL_TABLET | Freq: Every day | ORAL | Status: DC
  Administered 2015-04-17 – 2015-04-19 (×3): 10 mg via ORAL
  Filled 2015-04-16 (×3): qty 1

## 2015-04-16 MED ORDER — MUPIROCIN 2 % EX OINT
1.0000 "application " | TOPICAL_OINTMENT | Freq: Two times a day (BID) | CUTANEOUS | Status: DC
  Administered 2015-04-16 – 2015-04-19 (×6): 1 via NASAL
  Filled 2015-04-16: qty 22

## 2015-04-16 MED ORDER — SODIUM CHLORIDE 0.9 % IJ SOLN
INTRAMUSCULAR | Status: AC
  Filled 2015-04-16: qty 100

## 2015-04-16 MED ORDER — LOSARTAN POTASSIUM 50 MG PO TABS
100.0000 mg | ORAL_TABLET | Freq: Every day | ORAL | Status: DC
  Administered 2015-04-17 – 2015-04-19 (×3): 100 mg via ORAL
  Filled 2015-04-16 (×3): qty 2

## 2015-04-16 MED ORDER — LACTATED RINGERS IV SOLN
INTRAVENOUS | Status: DC
  Administered 2015-04-16 (×2): via INTRAVENOUS

## 2015-04-16 MED ORDER — DIPHENHYDRAMINE HCL 12.5 MG/5ML PO ELIX: 12.5000 mg | ORAL_SOLUTION | ORAL | Status: DC | PRN

## 2015-04-16 MED ORDER — MORPHINE SULFATE (PF) 2 MG/ML IV SOLN
2.0000 mg | INTRAVENOUS | Status: DC | PRN
  Administered 2015-04-16 – 2015-04-17 (×4): 2 mg via INTRAVENOUS
  Filled 2015-04-16 (×4): qty 1

## 2015-04-16 MED ORDER — AMLODIPINE BESYLATE 10 MG PO TABS
10.0000 mg | ORAL_TABLET | Freq: Every day | ORAL | Status: DC
Start: 2015-04-17 — End: 2015-04-19
  Administered 2015-04-17 – 2015-04-19 (×3): 10 mg via ORAL
  Filled 2015-04-16 (×3): qty 1

## 2015-04-16 MED ORDER — ZOLPIDEM TARTRATE 5 MG PO TABS: 5.0000 mg | ORAL_TABLET | Freq: Every evening | ORAL | Status: DC | PRN

## 2015-04-16 MED ORDER — BISACODYL 5 MG PO TBEC
5.0000 mg | DELAYED_RELEASE_TABLET | Freq: Every day | ORAL | Status: DC | PRN
  Filled 2015-04-16: qty 1

## 2015-04-16 MED ORDER — SENNOSIDES-DOCUSATE SODIUM 8.6-50 MG PO TABS
1.0000 | ORAL_TABLET | Freq: Two times a day (BID) | ORAL | Status: DC
  Administered 2015-04-16 – 2015-04-19 (×6): 1 via ORAL
  Filled 2015-04-16 (×6): qty 1

## 2015-04-16 MED ORDER — AMITRIPTYLINE HCL 25 MG PO TABS
25.0000 mg | ORAL_TABLET | Freq: Every day | ORAL | Status: DC
  Administered 2015-04-16 – 2015-04-18 (×3): 25 mg via ORAL
  Filled 2015-04-16 (×3): qty 1

## 2015-04-16 MED ORDER — NITROGLYCERIN 0.4 MG SL SUBL: 0.4000 mg | SUBLINGUAL_TABLET | SUBLINGUAL | Status: DC | PRN

## 2015-04-16 MED ORDER — MONTELUKAST SODIUM 10 MG PO TABS: 10.0000 mg | ORAL_TABLET | Freq: Every evening | ORAL | Status: DC | PRN

## 2015-04-16 MED ORDER — ONDANSETRON HCL 4 MG/2ML IJ SOLN
INTRAMUSCULAR | Status: DC | PRN
  Administered 2015-04-16: 8 mg via INTRAVENOUS

## 2015-04-16 MED ORDER — CEFAZOLIN SODIUM-DEXTROSE 2-3 GM-% IV SOLR
INTRAVENOUS | Status: AC
  Filled 2015-04-16: qty 50

## 2015-04-16 MED ORDER — ACETAMINOPHEN 650 MG RE SUPP: 650.0000 mg | Freq: Four times a day (QID) | RECTAL | Status: DC | PRN

## 2015-04-16 MED ORDER — SODIUM CHLORIDE 0.9 % IV SOLN
INTRAVENOUS | Status: DC | PRN
  Administered 2015-04-16: 60 mL

## 2015-04-16 MED ORDER — MORPHINE SULFATE (PF) 10 MG/ML IV SOLN
INTRAVENOUS | Status: DC | PRN
  Administered 2015-04-16: 10 mg

## 2015-04-16 MED ORDER — BUPIVACAINE-EPINEPHRINE 0.25% -1:200000 IJ SOLN
INTRAMUSCULAR | Status: DC | PRN
  Administered 2015-04-16: 30 mL

## 2015-04-16 MED ORDER — NEOMYCIN-POLYMYXIN B GU 40-200000 IR SOLN
Status: DC | PRN
  Administered 2015-04-16: 16 mL

## 2015-04-16 MED ORDER — FENTANYL CITRATE (PF) 100 MCG/2ML IJ SOLN
25.0000 ug | INTRAMUSCULAR | Status: DC | PRN
  Administered 2015-04-16 (×2): 25 ug via INTRAVENOUS

## 2015-04-16 MED ORDER — KETOROLAC TROMETHAMINE 15 MG/ML IJ SOLN
15.0000 mg | Freq: Once | INTRAMUSCULAR | Status: AC
Start: 2015-04-16 — End: 2015-04-16
  Administered 2015-04-16: 15 mg via INTRAVENOUS
  Filled 2015-04-16: qty 1

## 2015-04-16 MED ORDER — OXYCODONE HCL 5 MG PO TABS
5.0000 mg | ORAL_TABLET | ORAL | Status: DC | PRN
  Administered 2015-04-16: 5 mg via ORAL
  Administered 2015-04-16 – 2015-04-17 (×5): 10 mg via ORAL
  Administered 2015-04-18: 5 mg via ORAL
  Administered 2015-04-18: 10 mg via ORAL
  Administered 2015-04-18 – 2015-04-19 (×2): 5 mg via ORAL
  Filled 2015-04-16 (×3): qty 2
  Filled 2015-04-16: qty 1
  Filled 2015-04-16: qty 2
  Filled 2015-04-16: qty 1
  Filled 2015-04-16 (×3): qty 2
  Filled 2015-04-16: qty 1
  Filled 2015-04-16: qty 2

## 2015-04-16 MED ORDER — TIZANIDINE HCL 4 MG PO TABS: 2.0000 mg | ORAL_TABLET | Freq: Three times a day (TID) | ORAL | Status: DC | PRN

## 2015-04-16 MED ORDER — SODIUM CHLORIDE 0.9 % IV SOLN
INTRAVENOUS | Status: DC
  Administered 2015-04-17: 01:00:00 via INTRAVENOUS

## 2015-04-16 MED ORDER — DEXTROMETHORPHAN POLISTIREX ER 30 MG/5ML PO SUER
30.0000 mg | Freq: Two times a day (BID) | ORAL | Status: DC | PRN
  Filled 2015-04-16: qty 5

## 2015-04-16 MED ORDER — ONDANSETRON HCL 4 MG/2ML IJ SOLN
4.0000 mg | Freq: Once | INTRAMUSCULAR | Status: AC | PRN
  Administered 2015-04-16: 4 mg via INTRAVENOUS

## 2015-04-16 MED ORDER — ONDANSETRON HCL 4 MG PO TABS: 4.0000 mg | ORAL_TABLET | Freq: Four times a day (QID) | ORAL | Status: DC | PRN

## 2015-04-16 MED ORDER — TRIAMCINOLONE ACETONIDE 55 MCG/ACT NA AERO: 2.0000 | INHALATION_SPRAY | Freq: Every day | NASAL | Status: DC

## 2015-04-16 MED ORDER — MENTHOL 3 MG MT LOZG
1.0000 | LOZENGE | OROMUCOSAL | Status: DC | PRN
  Filled 2015-04-16: qty 9

## 2015-04-16 MED ORDER — BUPIVACAINE-EPINEPHRINE (PF) 0.25% -1:200000 IJ SOLN
INTRAMUSCULAR | Status: AC
  Filled 2015-04-16: qty 30

## 2015-04-16 MED ORDER — FLUTICASONE FUROATE-VILANTEROL 100-25 MCG/INH IN AEPB: 1.0000 | INHALATION_SPRAY | Freq: Every day | RESPIRATORY_TRACT | Status: DC

## 2015-04-16 SURGICAL SUPPLY — 56 items
BANDAGE ACE 6X5 VEL STRL LF (GAUZE/BANDAGES/DRESSINGS) ×3 IMPLANT
BLADE SAW 1 (BLADE) ×3 IMPLANT
BLADE SAW 1/2 (BLADE) ×3 IMPLANT
BONE CEMENT GENTAMICIN (Cement) ×3 IMPLANT
BONE CHIP PRESERV 40CC PCAN1/2 (Bone Implant) ×3 IMPLANT
CANISTER SUCT 1200ML W/VALVE (MISCELLANEOUS) ×3 IMPLANT
CANISTER SUCT 3000ML (MISCELLANEOUS) ×6 IMPLANT
CATH FOL LEG HOLDER (MISCELLANEOUS) ×3 IMPLANT
CATH TRAY METER 16FR LF (MISCELLANEOUS) ×3 IMPLANT
CEMENT BONE GENTAMICIN 40 (Cement) ×1 IMPLANT
CHLORAPREP W/TINT 26ML (MISCELLANEOUS) ×6 IMPLANT
COOLER POLAR GLACIER W/PUMP (MISCELLANEOUS) ×3 IMPLANT
COVER TABLE BACK 60X90 (DRAPES) ×3 IMPLANT
DRAPE SHEET LG 3/4 BI-LAMINATE (DRAPES) ×9 IMPLANT
ELECT CAUTERY BLADE 6.4 (BLADE) ×3 IMPLANT
ELECT REM PT RETURN 9FT ADLT (ELECTROSURGICAL) ×3
ELECTRODE REM PT RTRN 9FT ADLT (ELECTROSURGICAL) ×1 IMPLANT
GAUZE PETRO XEROFOAM 1X8 (MISCELLANEOUS) ×3 IMPLANT
GAUZE SPONGE 4X4 12PLY STRL (GAUZE/BANDAGES/DRESSINGS) ×3 IMPLANT
GLOVE BIOGEL PI IND STRL 9 (GLOVE) ×2 IMPLANT
GLOVE BIOGEL PI INDICATOR 9 (GLOVE) ×4
GLOVE INDICATOR 8.0 STRL GRN (GLOVE) ×3 IMPLANT
GLOVE SURG ORTHO 8.0 STRL STRW (GLOVE) ×3 IMPLANT
GLOVE SURG ORTHO 9.0 STRL STRW (GLOVE) ×3 IMPLANT
GOWN SPECIALTY ULTRA XL (MISCELLANEOUS) ×3 IMPLANT
GOWN STRL REUS W/ TWL LRG LVL3 (GOWN DISPOSABLE) ×2 IMPLANT
GOWN STRL REUS W/TWL LRG LVL3 (GOWN DISPOSABLE) ×4
HANDPIECE SUCTION TUBG SURGILV (MISCELLANEOUS) ×3 IMPLANT
HOOD PEEL AWAY FLYTE STAYCOOL (MISCELLANEOUS) ×6 IMPLANT
IMMBOLIZER KNEE 19 BLUE UNIV (SOFTGOODS) ×3 IMPLANT
INSERT TIBIAL SZ2 LEFT (Insert) ×3 IMPLANT
KIT RM TURNOVER STRD PROC AR (KITS) ×3 IMPLANT
KNIFE SCULPS 14X20 (INSTRUMENTS) ×3 IMPLANT
NEEDLE SPNL 18GX3.5 QUINCKE PK (NEEDLE) ×3 IMPLANT
NEEDLE SPNL 20GX3.5 QUINCKE YW (NEEDLE) ×3 IMPLANT
NS IRRIG 1000ML POUR BTL (IV SOLUTION) ×3 IMPLANT
PACK TOTAL KNEE (MISCELLANEOUS) ×3 IMPLANT
PAD WRAPON POLAR KNEE (MISCELLANEOUS) ×1 IMPLANT
SET EXT COMP 11X65 (Miscellaneous) ×3 IMPLANT
SOL .9 NS 3000ML IRR  AL (IV SOLUTION) ×2
SOL .9 NS 3000ML IRR UROMATIC (IV SOLUTION) ×1 IMPLANT
STAPLER SKIN PROX 35W (STAPLE) ×3 IMPLANT
SUCTION FRAZIER HANDLE 10FR (MISCELLANEOUS) ×2
SUCTION TUBE FRAZIER 10FR DISP (MISCELLANEOUS) ×1 IMPLANT
SUT DVC 2 QUILL PDO  T11 36X36 (SUTURE) ×2
SUT DVC 2 QUILL PDO T11 36X36 (SUTURE) ×1 IMPLANT
SUT DVC QUILL MONODERM 30X30 (SUTURE) ×3 IMPLANT
SUT TICRON 2-0 30IN 311381 (SUTURE) ×6 IMPLANT
SWAB CULTURE AMIES ANAERIB BLU (MISCELLANEOUS) ×3 IMPLANT
SYR 20CC LL (SYRINGE) ×3 IMPLANT
SYR 50ML LL SCALE MARK (SYRINGE) ×6 IMPLANT
TIBIAL TRAY FIXED MEDACTA 0207 (Joint) ×3 IMPLANT
TOWEL OR 17X26 4PK STRL BLUE (TOWEL DISPOSABLE) ×3 IMPLANT
TOWER CARTRIDGE SMART MIX (DISPOSABLE) ×3 IMPLANT
WATER STERILE IRR 1000ML POUR (IV SOLUTION) ×3 IMPLANT
WRAPON POLAR PAD KNEE (MISCELLANEOUS) ×3

## 2015-04-16 NOTE — Evaluation (Signed)
Physical Therapy Evaluation Patient Details Name: Rhonda Murphy MRN: [id number] DOB: 1945-05-08 Today's Date: 04/16/2015   History of Present Illness  Pt underwent L total knee revision secondary to loose tibial components. No reported post-op complications. Pt reporting a lot of nausea and pain at time of PT evaluation. Refuses to perform bed mobility, transfer, or ambulation with PT but agrees to bed exercises.   Clinical Impression  Pt reports high levels of pain and increased nausea which limits her participation with therapy. Pt refuses bed mobility, transfers, or ambulation despite encouragement from PT and RN. Pt agrees to bed exercises only. Pt is able to complete bed exercises as instructed but with increased pain with all movement of LLE. Will attempt transfers and ambulation tomorrow when pain and nausea are better controlled. Pt would like to return home at discharge with husband and [facility] PT. Given that this is a revision I anticipate this being realistic however difficult to assess due to inability to perform mobility with patient. Pt will benefit from skilled PT services to address deficits in strength, balance, and mobility in order to return to full function at home.     Follow Up Recommendations Home health PT;Other (comment) (To be updated pending progress)    Equipment Recommendations  None recommended by PT    Recommendations for Other Services       Precautions / Restrictions Precautions Precautions: Knee;Fall Precaution Booklet Issued: Yes (comment) Required Braces or Orthoses: Knee Immobilizer - Left Knee Immobilizer - Left: Discontinue once straight leg raise with < 10 degree lag Restrictions Weight Bearing Restrictions: Yes LLE Weight Bearing: Weight bearing as tolerated      Mobility  Bed Mobility               General bed mobility comments: Refuses  Transfers                 General transfer comment: Refuses  Ambulation/Gait             General Gait Details: Refuses  Stairs            Wheelchair Mobility    Modified Rankin (Stroke Patients Only)       Balance Overall balance assessment:  (Unable to assess at this time)                                           Pertinent Vitals/Pain Pain Assessment: 0-10 Pain Score: 9  Pain Location: L knee Pain Descriptors / Indicators: Sharp Pain Intervention(s): Limited activity within patient's tolerance    Home Living Family/patient expects to be discharged to:: Private residence Living Arrangements: Spouse/significant other Available Help at Discharge: Family Type of Home: House Home Access: Stairs to enter Entrance Stairs-Rails: Doctor, general practice of Steps: 4 Home Layout: Multi-level;Able to live on main level with bedroom/bathroom (Basement) Home Equipment: Dan Humphreys - 2 wheels;Bedside commode;Cane - single point;Cane - quad (no shower seat,)      Prior Function Level of Independence: Independent         Comments: Using spc vs multipoint cane for community ambulation since December     Hand Dominance   Dominant Hand: Right    Extremity/Trunk Assessment   Upper Extremity Assessment: Overall WFL for tasks assessed           Lower Extremity Assessment: LLE deficits/detail   LLE Deficits / Details: Unable to perform  SLR or SAQ without assistance. Full DF/PF. Pt denies numbness/tingling in LLE to light touch     Communication   Communication: No difficulties  Cognition Arousal/Alertness: Awake/alert Behavior During Therapy: WFL for tasks assessed/performed Overall Cognitive Status: Within Functional Limits for tasks assessed                      General Comments      Exercises Total Joint Exercises Ankle Circles/Pumps: Strengthening;Both;10 reps;Supine Quad Sets: Strengthening;Both;10 reps;Supine Gluteal Sets: Strengthening;Both;10 reps;Supine Towel Squeeze: Strengthening;Both;10  reps;Supine Short Arc Quad: Strengthening;Left;10 reps;Supine Heel Slides: Strengthening;Left;10 reps;Supine Hip ABduction/ADduction: Strengthening;Left;10 reps;Supine Straight Leg Raises: Strengthening;Left;10 reps;Supine Goniometric ROM: 0-46 AAROM, pain limited. Flexion measured in supine      Assessment/Plan    PT Assessment Patient needs continued PT services  PT Diagnosis Difficulty walking;Abnormality of gait;Generalized weakness;Acute pain   PT Problem List Decreased strength;Decreased activity tolerance;Decreased range of motion;Decreased mobility;Pain  PT Treatment Interventions DME instruction;Gait training;Stair training;Therapeutic activities;Therapeutic exercise;Balance training;Neuromuscular re-education;Patient/family education;Manual techniques   PT Goals (Current goals can be found in the Care Plan section) Acute Rehab PT Goals Patient Stated Goal: Return home with husband PT Goal Formulation: With patient Time For Goal Achievement: 04/30/15 Potential to Achieve Goals: Good    Frequency BID   Barriers to discharge Inaccessible home environment Stairs to enter home    Co-evaluation               End of Session   Activity Tolerance: Patient limited by pain Patient left: in bed;with call bell/phone within reach;with bed alarm set;with SCD's reapplied (towel roll under heel, polar care in place) Nurse Communication: Mobility status         Time: 1610-9604 PT Time Calculation (min) (ACUTE ONLY): 25 min   Charges:   PT Evaluation $PT Eval Low Complexity: 1 Procedure PT Treatments $Therapeutic Exercise: 8-22 mins   PT G Codes:       Sharalyn Ink Wilgus Deyton PT, DPT   Houa Nie 04/16/2015, 3:37 PM

## 2015-04-16 NOTE — Anesthesia Postprocedure Evaluation (Signed)
Anesthesia Post Note  Patient: Rhonda Murphy  Procedure(s) Performed: Procedure(s) (LRB): TOTAL KNEE REVISION (Left)  Patient location during evaluation: PACU Anesthesia Type: Spinal Level of consciousness: awake Pain management: pain level controlled Vital Signs Assessment: post-procedure vital signs reviewed and stable Respiratory status: spontaneous breathing Cardiovascular status: blood pressure returned to baseline Anesthetic complications: no    Last Vitals:  Filed Vitals:   04/16/15 1340 04/16/15 1441  BP: 138/56 129/51  Pulse: 63 66  Temp: 35.9 C 36.6 C  Resp: 18 18    Last Pain:  Filed Vitals:   04/16/15 1441  PainSc: 6                  Josselin Gaulin S

## 2015-04-16 NOTE — Anesthesia Procedure Notes (Addendum)
Spinal Patient location during procedure: OR Staffing Anesthesiologist: Berdine Addison Performed by: anesthesiologist  Preanesthetic Checklist Completed: patient identified, site marked, surgical consent, pre-op evaluation, timeout performed, IV checked and risks and benefits discussed Spinal Block Patient position: sitting Prep: Betadine Patient monitoring: heart rate, cardiac monitor, continuous pulse ox and blood pressure Approach: midline Location: L3-4 Injection technique: single-shot Needle Needle type: Pencil-Tip  Needle gauge: 25 G Needle length: 9 cm Assessment Sensory level: T10 Additional Notes 0725 Tetracaine  and marcaine 12.5mg .  Date/Time: 04/16/2015 7:45 AM Performed by: Junious Silk Pre-anesthesia Checklist: Patient identified, Emergency Drugs available, Suction available, Patient being monitored and Timeout performed Patient Re-evaluated:Patient Re-evaluated prior to inductionOxygen Delivery Method: Simple face mask

## 2015-04-16 NOTE — Op Note (Signed)
04/16/2015  9:29 AM  PATIENT:  Rhonda Murphy  70 y.o. female  PRE-OPERATIVE DIAGNOSIS:  LEFT TIBIAL PAIN loose tibial component  POST-OPERATIVE DIAGNOSIS:  LEFT TIBIAL PAIN same  PROCEDURE:  Procedure(s): TOTAL KNEE REVISION (Left) tibial component  SURGEON: Leitha Schuller, MD  ASSISTANTS: [place] Neighbor [facility]  ANESTHESIA:   spinal  EBL:  Total I/O In: 1000 [I.V.:1000] Out: 100 [Urine:100]  BLOOD ADMINISTERED:none  DRAINS: none   LOCAL MEDICATIONS USED:  OTHER Exparel  SPECIMEN:  No Specimen  DISPOSITION OF SPECIMEN:  N/A  COUNTS:  YES  TOURNIQUET:   Total Tourniquet Time Documented: Thigh (Left) - 010272 minutes Total: Thigh (Left) - 536644 minutes  78 minutes at 300 mmHg  IMPLANTS:  GMK sphere tibial components 65 mm stem with size 2 left tibial component, 14 mm flex insert  DICTATION: .Dragon Dictation  patient brought the operating room and after adequate anesthesia was obtained left leg was prepped and draped in sterile fashion the patient identification and timeout procedures were completed. Tourniquet was raised to 3 mmHg the prior incision was utilized to by medial parapatellar arthrotomy inspection revealed mild synovitis within the joint and obvious subsidence of the medial tibia component. The medial capsule was elevated and the scar. On the posterior aspect of the tibial tendon was removed to provide adequate exposure the tibia was brought forward and the tibial insert was removed. The component was grossly loose and was removed without difficulty. Cement removed removal tools were used to remove the bone plug and cement around the stem a freshen up cut was made using an extra medullary guide and with the 2 baseplate placed proximal tibia preparation was made with the keel punch prior to placing the The trial impaction grafting technique was used around the medial side after curetting away any loose or soft bone with cancellus chips to build up the medial side.  Next the final trials were placed with a 14 mm insert giving good stability. The bony surfaces of the of the knee was thoroughly irrigated this time and exparel injected around the knee.  the cement was mixed and placed along the proximal tibia the tibial component inserted with a 14 mm trial followed by placement of the final component with torque screw the patella tracked well with no touch technique and there is appropriate stability to the knee. After the cement set the knee was thoroughly irrigated tourniquet let down and the report capsulotomy. Using a heavy Quill followed by to a Quill and skin staples Xeroform 4 x 4 web roll and Ace wrap then applied along Polar Care   PLAN OF CARE: Admit to inpatient   PATIENT DISPOSITION:  PACU - hemodynamically stable.

## 2015-04-16 NOTE — OR Nursing (Signed)
Explanted tibia and articular surface

## 2015-04-16 NOTE — Transfer of Care (Signed)
Immediate Anesthesia Transfer of Care Note  Patient: Rhonda Murphy  Procedure(s) Performed: Procedure(s): TOTAL KNEE REVISION (Left)  Patient Location: PACU  Anesthesia Type:Spinal  Level of Consciousness: sedated  Airway & Oxygen Therapy: Patient Spontanous Breathing and Patient connected to face mask oxygen  Post-op Assessment: Report given to RN and Post -op Vital signs reviewed and stable  Post vital signs: Reviewed and stable  Last Vitals:  Filed Vitals:   04/16/15 0620  BP: 137/70  Pulse: 76  Temp: 36.9 C  Resp: 16    Complications: No apparent anesthesia complications

## 2015-04-16 NOTE — Anesthesia Preprocedure Evaluation (Addendum)
Anesthesia Evaluation  Patient identified by MRN, date of birth, ID band Patient awake    Reviewed: Allergy & Precautions, NPO status , Patient's Chart, lab work & pertinent test results, reviewed documented beta blocker date and time   Airway Mallampati: II  TM Distance: >3 FB     Dental  (+) Chipped   Pulmonary asthma ,           Cardiovascular hypertension, Pt. on medications and Pt. on home beta blockers + dysrhythmias      Neuro/Psych    GI/Hepatic GERD  ,  Endo/Other    Renal/GU      Musculoskeletal  (+) Arthritis ,   Abdominal   Peds  Hematology  (+) anemia ,   Anesthesia Other Findings Obese. Lbbb. No cardiac symptoms.  Reproductive/Obstetrics                            Anesthesia Physical Anesthesia Plan  ASA: III  Anesthesia Plan: Spinal   Post-op Pain Management:    Induction: Intravenous  Airway Management Planned:   Additional Equipment:   Intra-op Plan:   Post-operative Plan:   Informed Consent: I have reviewed the patients History and Physical, chart, labs and discussed the procedure including the risks, benefits and alternatives for the proposed anesthesia with the patient or authorized representative who has indicated his/her understanding and acceptance.     Plan Discussed with: CRNA  Anesthesia Plan Comments:         Anesthesia Quick Evaluation

## 2015-04-16 NOTE — H&P (Signed)
Reviewed paper H+P, will be scanned into chart. No changes noted.  

## 2015-04-16 NOTE — Care Management Note (Addendum)
Case Management Note  Patient Details  Name: [name] MRN: [id number] Date of Birth: 07-26-1945  Subjective/Objective:                  Met with patient to discuss discharge planning. She plans to return to her [place] home with her husband at discharge. She would like to use [facility] Address: [address] [place] [address] Phone: [phone] fax [phone] with anticipated discharge on Thursday. She has a rolling walker available for use at home. No DME needs per Corene Cornea PT. She uses [facility]  [phone] for Rx.  Action/Plan: I have faxed home health orders and contacted [facility] for [facility] on Friday this week- they have accepted patient. [facility] 46m [facility]   Expected Discharge Date:                  Expected Discharge Plan:     In-House Referral:     Discharge planning Services  CM Consult  Post Acute Care Choice:  Home Health Choice offered to:  Patient  DME Arranged:    DME Agency:     HH Arranged:  PT HSchleicherAgency:   ([facility])  Status of Service:  In process, will continue to follow  Medicare Important Message Given:    Date Medicare IM Given:    Medicare IM give by:    Date Additional Medicare IM Given:    Additional Medicare Important Message give by:     If discussed at [place] Stay Meetings, dates discussed:    Additional Comments: Lovenox $100.54.  AMarshell Garfinkel RN 04/16/2015, 3:38 PM

## 2015-04-17 ENCOUNTER — Encounter: Payer: Self-pay | Admitting: Orthopedic Surgery

## 2015-04-17 LAB — BASIC METABOLIC PANEL
ANION GAP: 5 (ref 5–15)
BUN: 11 mg/dL (ref 6–20)
CHLORIDE: 102 mmol/L (ref 101–111)
CO2: 27 mmol/L (ref 22–32)
Calcium: 8.4 mg/dL — ABNORMAL LOW (ref 8.9–10.3)
Creatinine, Ser: 0.94 mg/dL (ref 0.44–1.00)
GFR calc Af Amer: >60 mL/min (ref >60)
GFR calc non Af Amer: 59 mL/min — ABNORMAL LOW (ref >60)
GLUCOSE: 116 mg/dL — AB (ref 65–99)
POTASSIUM: 4.1 mmol/L (ref 3.5–5.1)
Sodium: 134 mmol/L — ABNORMAL LOW (ref 135–145)

## 2015-04-17 LAB — CBC
HEMATOCRIT: 32.7 % — AB (ref 35.0–47.0)
HEMOGLOBIN: 11 g/dL — AB (ref 12.0–16.0)
MCH: 29.7 pg (ref 26.0–34.0)
MCHC: 33.6 g/dL (ref 32.0–36.0)
MCV: 88.5 fL (ref 80.0–100.0)
Platelets: 218 10*3/uL (ref 150–440)
RBC: 3.7 MIL/uL — AB (ref 3.80–5.20)
RDW: 13.2 % (ref 11.5–14.5)
WBC: 9.3 10*3/uL (ref 3.6–11.0)

## 2015-04-17 NOTE — Progress Notes (Signed)
Clinical Social Worker (CSW) received SNF consult. PT is recommending home health. RN Case Manager is aware of above. Please reconsult if future social work needs arise. CSW signing off.   Lowana Hable Morgan, LCSW [phone] 

## 2015-04-17 NOTE — Progress Notes (Signed)
   Subjective: 1 Day Post-Op Procedure(s) (LRB): TOTAL KNEE REVISION (Left) Patient reports pain as mild.   Patient is well, and has had no acute complaints or problems Denies any CP, SOB, ABD pain. We will continue therapy today.  Plan is to go Home after hospital stay.  Objective: Vital signs in last 24 hours: Temp:  [96.3 F (35.7 C)-99.5 F (37.5 C)] 98.9 F (37.2 C) (02/01 0637) Pulse Rate:  [57-85] 85 (02/01 0637) Resp:  [14-19] 17 (02/01 0637) BP: (93-155)/(49-69) 150/57 mmHg (02/01 0637) SpO2:  [92 %-100 %] 92 % (02/01 0637) Weight:  [86.909 kg (191 lb 9.6 oz)] 86.909 kg (191 lb 9.6 oz) (01/31 1817)  Intake/Output from previous day: 01/31 0701 - 02/01 0700 In: 2439 [P.O.:240; I.V.:2199] Out: 2650 [Urine:2600; Blood:50] Intake/Output this shift: Total I/O In: 300 [Other:300] Out: 0    Recent Labs  04/16/15 0645 04/16/15 1153 04/17/15 0646  HGB 12.9 11.1* 11.0*    Recent Labs  04/16/15 1153 04/17/15 0646  WBC 7.7 9.3  RBC 3.79* 3.70*  HCT 34.0* 32.7*  PLT 222 218    Recent Labs  04/16/15 0645 04/16/15 1153 04/17/15 0646  NA 137  --  134*  K 4.4  --  4.1  CL  --   --  102  CO2  --   --  27  BUN  --   --  11  CREATININE  --  0.84 0.94  GLUCOSE 95  --  116*  CALCIUM  --   --  8.4*   No results for input(s): LABPT, INR in the last 72 hours.  EXAM General - Patient is Alert, Appropriate and Oriented Extremity - Neurovascular intact Sensation intact distally Intact pulses distally Dorsiflexion/Plantar flexion intact Dressing - dressing C/D/I and no drainage Motor Function - intact, moving foot and toes well on exam.   Past Medical History  Diagnosis Date  . Mitral valve prolapse   . Hypertension   . Acid reflux   . Asthma   . Shingles   . C. difficile colitis   . Back pain   . Influenza A 03/24/2013  . Small bowel obstruction (HCC) 03/24/2013    SBO vs. enteritis.   . Enteritis 03/21/2013  . LBBB (left bundle branch block) 03/21/2013  .  Asthma   . Arthritis     Assessment/Plan:   1 Day Post-Op Procedure(s) (LRB): TOTAL KNEE REVISION (Left) Active Problems:   Failed total knee, left (HCC)  Estimated body mass index is 36.22 kg/(m^2) as calculated from the following:   Height as of this encounter:  (1.549 m).   Weight as of this encounter: 86.909 kg (191 lb 9.6 oz). Advance diet Up with therapy  Needs BM Recheck labs in the am  DVT Prophylaxis - Lovenox, Foot Pumps and TED hose Weight-Bearing as tolerated to left leg D/C O2 and Pulse OX and try on Room Air  T. Cran[place] Neighbor, PA-C [facility] 04/17/2015, 8:05 AM

## 2015-04-17 NOTE — Anesthesia Postprocedure Evaluation (Signed)
Anesthesia Post Note  Patient: Rhonda Murphy  Procedure(s) Performed: Procedure(s) (LRB): TOTAL KNEE REVISION (Left)  Patient location during evaluation: Nursing Unit Anesthesia Type: Spinal Level of consciousness: awake and alert and oriented Pain management: pain level controlled Vital Signs Assessment: post-procedure vital signs reviewed and stable Respiratory status: spontaneous breathing and nonlabored ventilation Cardiovascular status: blood pressure returned to baseline and stable Postop Assessment: no headache, no backache, no signs of nausea or vomiting and patient able to bend at knees Anesthetic complications: no    Last Vitals:  Filed Vitals:   04/17/15 0429 04/17/15 0637  BP: 132/53 150/57  Pulse: 81 85  Temp: 37.5 C 37.2 C  Resp: 19 17    Last Pain:  Filed Vitals:   04/17/15 0700  PainSc: 7                  [employer]

## 2015-04-17 NOTE — Progress Notes (Signed)
Physical Therapy Treatment Patient Details Name: Rhonda Murphy MRN: [id number] DOB: 1945/05/15 Today's Date: 04/17/2015    History of Present Illness Pt underwent L total knee revision secondary to loose tibial components. No reported post-op complications. Pt reporting a lot of nausea and pain at time of PT evaluation. Refuses to perform bed mobility, transfer, or ambulation with PT but agrees to bed exercises.     PT Comments    Pt demonstrates ability to increase her ambulation distance this afternoon. However she does complain of DOE with corresponding desaturation. Able to improve O2 sats with incentive spirometer however with exercise SaO2 drops to 86-88%. Pt also complaining of increased L calf swelling and pain. RN notified who reports she will follow-up with MD. Pt demonstrating improving tolerance to ambulation and bed exercises. Will perform stair training tomorrow in anticipation of discharge once she has met all of her medical goals. Pt will benefit from skilled PT services to address deficits in strength, balance, and mobility in order to return to full function at home.    Follow Up Recommendations  Home health PT     Equipment Recommendations  None recommended by PT    Recommendations for Other Services       Precautions / Restrictions Precautions Precautions: Knee;Fall Precaution Booklet Issued: Yes (comment) Required Braces or Orthoses: Knee Immobilizer - Left Knee Immobilizer - Left: Discontinue once straight leg raise with < 10 degree lag Restrictions Weight Bearing Restrictions: Yes LLE Weight Bearing: Weight bearing as tolerated    Mobility  Bed Mobility Overal bed mobility: Needs Assistance Bed Mobility: Sit to Supine       Sit to supine: Min assist   General bed mobility comments: Pt requires cues and instruction for use of RLE to assist LLE when returning to bed. MinA+1 to assist LLE to returning to bed   Transfers Overall transfer level: Needs  assistance Equipment used: Rolling walker (2 wheeled) Transfers: Sit to/from Stand Sit to Stand: Min guard         General transfer comment: Pt demonstrates improving speed and sequencing with transfers. Improving knee flexion during transfers but still limited. Improving stability in standing  Ambulation/Gait Ambulation/Gait assistance: Min guard Ambulation Distance (Feet): 250 Feet Assistive device: Rolling walker (2 wheeled) Gait Pattern/deviations: Decreased step length - right;Antalgic Gait velocity: Decreased Gait velocity interpretation: <1.8 ft/sec, indicative of risk for recurrent falls General Gait Details: Partial step-through pattern demonstrated during ambulation with cues to equalize step length and improve LLE weight acceptance. Pt able to improve overall gait quality and speed with cues. Cues for upright posture and decreased UE reliance. During ambulation SaO2 measured and found to drop to 86-88% on room air. Pt provided standing rest break and SaO2 does not recover. Returned to room and performed incentive spirometer. SaO2 increased to 96%. Repeated second round of ambulation and SaO2 drops again. RN notified to pt complainting of DOE with corresponding decrease in SaO2. Pt reports increased L calf swelling and tenderness to palpation. Difficult to assess size of calf due to dressing bulk. Calf is mildly more tender with palpation but not severely more tender. RN notified.    Stairs            Wheelchair Mobility    Modified Rankin (Stroke Patients Only)       Balance Overall balance assessment: Needs assistance Sitting-balance support: No upper extremity supported Sitting balance-Leahy Scale: Good     Standing balance support: No upper extremity supported Standing balance-Leahy Scale: Fair  Cognition Arousal/Alertness: Awake/alert Behavior During Therapy: WFL for tasks assessed/performed Overall Cognitive Status: Within  Functional Limits for tasks assessed                      Exercises Total Joint Exercises Ankle Circles/Pumps: Strengthening;Both;10 reps;Supine Quad Sets: Strengthening;Both;10 reps;Supine Gluteal Sets: Strengthening;Both;10 reps;Supine Hip ABduction/ADduction: Strengthening;10 reps;Left;Supine Straight Leg Raises: Strengthening;10 reps;Supine;Left    General Comments        Pertinent Vitals/Pain Pain Assessment: 0-10 Pain Score: 5  Pain Location: L knee Pain Descriptors / Indicators: Sharp Pain Intervention(s): Monitored during session    Home Living                      Prior Function            PT Goals (current goals can now be found in the care plan section) Acute Rehab PT Goals Patient Stated Goal: Return home with husband PT Goal Formulation: With patient Time For Goal Achievement: 04/30/15 Potential to Achieve Goals: Good Progress towards PT goals: Progressing toward goals    Frequency  BID    PT Plan Current plan remains appropriate    Co-evaluation             End of Session Equipment Utilized During Treatment: Gait belt Activity Tolerance: Patient tolerated treatment well Patient left: with call bell/phone within reach;with SCD's reapplied;in bed;with bed alarm set (towel roll under heel, polar care in place)     Time: 7034-0352 PT Time Calculation (min) (ACUTE ONLY): 30 min  Charges:  $Gait Training: 8-22 mins $Therapeutic Exercise: 8-22 mins                    G Codes:      [name] PT, DPT   Huprich,Jason 04/17/2015, 4:45 PM

## 2015-04-17 NOTE — Progress Notes (Signed)
Physical Therapy Treatment Patient Details Name: Rhonda Murphy MRN: [id number] DOB: 1945/09/26 Today's Date: 04/17/2015    History of Present Illness Pt underwent L total knee revision secondary to loose tibial components. No reported post-op complications. Pt reporting a lot of nausea and pain at time of PT evaluation. Refuses to perform bed mobility, transfer, or ambulation with PT but agrees to bed exercises.     PT Comments    Pt demonstrates excellent progress with therapy this morning. She is able to perform all mobility as requested with only minimal increase in pain. Pt able to ambulate approximately 1/3 of the distance around the RN station and then back to the recliner. Will attempt a full lap around RN station this afternoon. Able to complete all seated exercises as instructed with increase in L knee pain during L knee flexion. Pt will benefit from skilled PT services to address deficits in strength, balance, and mobility in order to return to full function at home.    Follow Up Recommendations  Home health PT     Equipment Recommendations  None recommended by PT    Recommendations for Other Services       Precautions / Restrictions Precautions Precautions: Knee;Fall Precaution Booklet Issued: Yes (comment) Required Braces or Orthoses: Knee Immobilizer - Left Knee Immobilizer - Left: Discontinue once straight leg raise with < 10 degree lag Restrictions Weight Bearing Restrictions: Yes LLE Weight Bearing: Weight bearing as tolerated    Mobility  Bed Mobility               General bed mobility comments: Pt received upright in recliner and left in recliner. Will assess this PM when returning to bed  Transfers Overall transfer level: Needs assistance Equipment used: Rolling walker (2 wheeled) Transfers: Sit to/from Stand Sit to Stand: Min guard         General transfer comment: Pt demonstrates safe hand placemet with transfer. Decreased weight shift to LLE  and decreased L knee flexion during transfer. However pt is able to come to standing with good stability noted  Ambulation/Gait Ambulation/Gait assistance: Min guard Ambulation Distance (Feet): 180 Feet Assistive device: Rolling walker (2 wheeled) Gait Pattern/deviations: Decreased step length - right;Decreased step length - left     General Gait Details: Partial step-through pattern demonstrated during ambulation with gradual increase in step length throughout distance. Pt provided cues for upright posture and to decrease UE reliance during ambulation. Gradual increase in gait speed. Vitals monitored and remain WNL. With position changes orthostatic BP readings are WNL although pt reports some dizziness with standing. Symptoms resolve after 1-2 minutes of standing   Stairs            Wheelchair Mobility    Modified Rankin (Stroke Patients Only)       Balance                                    Cognition Arousal/Alertness: Awake/alert Behavior During Therapy: WFL for tasks assessed/performed Overall Cognitive Status: Within Functional Limits for tasks assessed                      Exercises Total Joint Exercises Hip ABduction/ADduction: Strengthening;10 reps;Seated;Both Straight Leg Raises: Strengthening;10 reps;Supine;Left Long Arc Quad: Strengthening;Left;10 reps;Seated Knee Flexion: Strengthening;Left;10 reps;Seated Goniometric ROM: -5 to 69 degrees AAROM, pain limited    General Comments        Pertinent Vitals/Pain  Pain Assessment: 0-10 Pain Score: 7  Pain Location: L knee Pain Descriptors / Indicators: Sharp Pain Intervention(s): Monitored during session;Premedicated before session;Limited activity within patient's tolerance    Home Living                      Prior Function            PT Goals (current goals can now be found in the care plan section) Acute Rehab PT Goals Patient Stated Goal: Return home with  husband PT Goal Formulation: With patient Time For Goal Achievement: 04/30/15 Potential to Achieve Goals: Good Progress towards PT goals: Progressing toward goals    Frequency  BID    PT Plan Current plan remains appropriate    Co-evaluation             End of Session Equipment Utilized During Treatment: Gait belt Activity Tolerance: Patient limited by pain Patient left: with call bell/phone within reach;with SCD's reapplied;in chair;with chair alarm set (towel roll under heel, polar care in place)     Time: 9147-8295 PT Time Calculation (min) (ACUTE ONLY): 30 min  Charges:  $Gait Training: 8-22 mins $Therapeutic Exercise: 8-22 mins                    G Codes:      Sharalyn Ink Jerica Creegan PT, DPT   Johniya Durfee 04/17/2015, 10:40 AM

## 2015-04-17 NOTE — Evaluation (Signed)
Occupational Therapy Evaluation Patient Details Name: Rhonda Murphy MRN: [id number] DOB: Dec 26, 1945 Today's Date: 04/17/2015    History of Present Illness This patient is a 70 year old female who came to [facility] and underwent L total knee revision secondary to loose tibial components. No reported post-op complications.    Clinical Impression   This patient is a 70 year old female who came to [facility] for a L total knee replacement revision.  Patient lives in a 2 story home but can stay on the 1st floor.  She had been independent with ADL and functional mobility. She now shows deficits with pain and mobility.  She did well during this evaluation demonstrating ability to dress lower body with out assistive device. No further Occupational Therapy needed at this time.      Follow Up Recommendations  No OT follow up    Equipment Recommendations       Recommendations for Other Services       Precautions / Restrictions Precautions Precautions: Knee;Fall Precaution Booklet Issued: Yes (comment) Required Braces or Orthoses: Knee Immobilizer - Left Knee Immobilizer - Left: Discontinue once straight leg raise with < 10 degree lag Restrictions Weight Bearing Restrictions: Yes LLE Weight Bearing: Weight bearing as tolerated      Mobility Bed Mobility               General bed mobility comments: Pt received upright in recliner and left in recliner. Will assess this PM when returning to bed  Transfers sit to stand and stand to sit with contact guard assist using walker and verbal cues             Balance                                            ADL                                         General ADL Comments: Patient had been independent with ADL and works.  Today patient practiced techniques for lower body dressing. Patient able to demonstrate ability to dress lower body with out  assistive devices. Cues needed for technique and safety.      Vision     Perception     Praxis      Pertinent Vitals/Pain Pain Assessment: 0-10 Pain Score: 6  Pain Location: L kne Pain Descriptors / Indicators: Sharp Pain Intervention(s): Monitored during session;Premedicated before session;Limited activity within patient's tolerance     Hand Dominance Right   Extremity/Trunk Assessment Upper Extremity Assessment Upper Extremity Assessment: Overall WFL for tasks assessed   Lower Extremity Assessment Lower Extremity Assessment: Defer to PT evaluation       Communication Communication Communication: No difficulties   Cognition Arousal/Alertness: Awake/alert Behavior During Therapy: WFL for tasks assessed/performed Overall Cognitive Status: Within Functional Limits for tasks assessed                     General Comments       Exercises     Shoulder Instructions      Home Living Family/patient expects to be discharged to:: Private residence Living Arrangements: Spouse/significant other Available Help at Discharge: Family Type of Home: House Home Access: Stairs to  enter Entrance Stairs-Number of Steps: 4 Entrance Stairs-Rails: Right;Left Home Layout: Multi-level;Able to live on main level with bedroom/bathroom     Bathroom Shower/Tub: Producer, television/film/video: Handicapped height Bathroom Accessibility: Yes              Prior Functioning/Environment Level of Independence: Independent        Comments: works    OT Diagnosis: Acute pain   OT Problem List:     OT Treatment/Interventions:      OT Goals(Current goals can be found in the care plan section) Acute Rehab OT Goals Patient Stated Goal: Return home with husband  OT Frequency:     Barriers to D/C:            Co-evaluation              End of Session Equipment Utilized During Treatment: Gait belt  Activity Tolerance:   Patient left: in chair;with call  bell/phone within reach;with chair alarm set   Time: 1023-1046 OT Time Calculation (min): 23 min Charges:  OT General Charges $OT Visit: 1 Procedure OT Evaluation $OT Eval Low Complexity: 1 Procedure OT Treatments $Self Care/Home Management : 8-22 mins G-Codes:    Gwyndolyn Kaufman, MS/OTR/L  04/17/2015, 10:53 AM

## 2015-04-18 ENCOUNTER — Inpatient Hospital Stay: Payer: Medicare Other

## 2015-04-18 ENCOUNTER — Encounter: Payer: Self-pay | Admitting: Orthopedic Surgery

## 2015-04-18 LAB — BASIC METABOLIC PANEL
ANION GAP: 6 (ref 5–15)
BUN: 10 mg/dL (ref 6–20)
CALCIUM: 8.4 mg/dL — AB (ref 8.9–10.3)
CO2: 27 mmol/L (ref 22–32)
Chloride: 100 mmol/L — ABNORMAL LOW (ref 101–111)
Creatinine, Ser: 0.78 mg/dL (ref 0.44–1.00)
Glucose, Bld: 108 mg/dL — ABNORMAL HIGH (ref 65–99)
POTASSIUM: 3.9 mmol/L (ref 3.5–5.1)
SODIUM: 133 mmol/L — AB (ref 135–145)

## 2015-04-18 LAB — CBC
HCT: 29.6 % — ABNORMAL LOW (ref 35.0–47.0)
HEMOGLOBIN: 10 g/dL — AB (ref 12.0–16.0)
MCH: 29.5 pg (ref 26.0–34.0)
MCHC: 33.7 g/dL (ref 32.0–36.0)
MCV: 87.6 fL (ref 80.0–100.0)
PLATELETS: 200 10*3/uL (ref 150–440)
RBC: 3.37 MIL/uL — AB (ref 3.80–5.20)
RDW: 13.4 % (ref 11.5–14.5)
WBC: 8.3 10*3/uL (ref 3.6–11.0)

## 2015-04-18 NOTE — Care Management Important Message (Signed)
Important Message  Patient Details  Name: Rhonda Murphy MRN: [id number] Date of Birth: 06-27-1945   Medicare Important Message Given:  Yes    Collie Siad, RN 04/18/2015, 8:42 AM

## 2015-04-18 NOTE — Care Management (Signed)
O2 sats dropping to 88% on room air with ambulation. Chris PA notified that discharge may need to be delayed. Molli Hazard RN aware; Dava RN aware of O2 sats and said she would call Th[facility] Ohm. I have notified [facility] and resent home health orders.

## 2015-04-18 NOTE — Progress Notes (Signed)
   Subjective: 2 Days Post-Op Procedure(s) (LRB): TOTAL KNEE REVISION (Left) Patient reports pain as mild.   Patient is well, and has had no acute complaints or problems Denies any CP, SOB, ABD pain. We will continue therapy today.  Plan is to go Home after hospital stay.  Objective: Vital signs in last 24 hours: Temp:  [98.6 F (37 C)-98.7 F (37.1 C)] 98.7 F (37.1 C) (02/02 0500) Pulse Rate:  [78-80] 80 (02/02 0500) Resp:  [18] 18 (02/02 0500) BP: (146-149)/(61-64) 149/64 mmHg (02/02 0500) SpO2:  [93 %] 93 % (02/02 0500)  Intake/Output from previous day: 02/01 0701 - 02/02 0700 In: 1140 [P.O.:840] Out: 325 [Urine:325] Intake/Output this shift:     Recent Labs  04/16/15 0645 04/16/15 1153 04/17/15 0646 04/18/15 0646  HGB 12.9 11.1* 11.0* 10.0*    Recent Labs  04/17/15 0646 04/18/15 0646  WBC 9.3 8.3  RBC 3.70* 3.37*  HCT 32.7* 29.6*  PLT 218 200    Recent Labs  04/16/15 0645 04/16/15 1153 04/17/15 0646  NA 137  --  134*  K 4.4  --  4.1  CL  --   --  102  CO2  --   --  27  BUN  --   --  11  CREATININE  --  0.84 0.94  GLUCOSE 95  --  116*  CALCIUM  --   --  8.4*   No results for input(s): LABPT, INR in the last 72 hours.  EXAM General - Patient is Alert, Appropriate and Oriented Extremity - Neurovascular intact Sensation intact distally Intact pulses distally Dorsiflexion/Plantar flexion intact Dressing - dressing C/D/I and no drainage, new dressing applied Motor Function - intact, moving foot and toes well on exam.   Past Medical History  Diagnosis Date  . Mitral valve prolapse   . Hypertension   . Acid reflux   . Asthma   . Shingles   . C. difficile colitis   . Back pain   . Influenza A 03/24/2013  . Small bowel obstruction (HCC) 03/24/2013    SBO vs. enteritis.   . Enteritis 03/21/2013  . LBBB (left bundle branch block) 03/21/2013  . Asthma   . Arthritis     Assessment/Plan:   2 Days Post-Op Procedure(s) (LRB): TOTAL KNEE  REVISION (Left) Active Problems:   Failed total knee, left (HCC)  Estimated body mass index is 36.22 kg/(m^2) as calculated from the following:   Height as of this encounter:  (1.549 m).   Weight as of this encounter: 86.909 kg (191 lb 9.6 oz). Advance diet Up with therapy  Needs BM BMP pending Plan on discharge to home with home health Friday. Family will not be able to assist patient at home until Friday.  DVT Prophylaxis - Lovenox, Foot Pumps and TED hose Weight-Bearing as tolerated to left leg D/C O2 and Pulse OX and try on Room Air  T. Cranston Neighbor, PA-C [facility] 04/18/2015, 8:07 AM

## 2015-04-18 NOTE — Progress Notes (Signed)
Physical Therapy Treatment Patient Details Name: Rhonda Murphy MRN: [id number] DOB: 01-06-1946 Today's Date: 04/18/2015    History of Present Illness Pt underwent L total knee revision secondary to loose tibial components. No reported post-op complications. Pt reporting a lot of nausea and pain at time of PT evaluation. Refuses to perform bed mobility, transfer, or ambulation with PT but agrees to bed exercises.     PT Comments    Pt demonstrates improving mobility with each session. She is able to complete stair training again. Pt ambulated on 2 L/min O2 and SaO2 remains at 92% throughout. Pt denies DOE. Improving L knee flexion with seated stretches. Pt will benefit from skilled PT services to address deficits in strength, balance, and mobility in order to return to full function at home.    Follow Up Recommendations  Home health PT     Equipment Recommendations  None recommended by PT    Recommendations for Other Services       Precautions / Restrictions Precautions Precautions: Knee;Fall Precaution Booklet Issued: Yes (comment) Required Braces or Orthoses: Knee Immobilizer - Left Knee Immobilizer - Left: Discontinue once straight leg raise with < 10 degree lag Restrictions Weight Bearing Restrictions: Yes LLE Weight Bearing: Weight bearing as tolerated    Mobility  Bed Mobility Overal bed mobility: Needs Assistance Bed Mobility: Supine to Sit       Sit to supine: Supervision   General bed mobility comments: Pt utilizes UE to assist LUE during both phases of transfer but is able to perform safely  Transfers Overall transfer level: Needs assistance Equipment used: Rolling walker (2 wheeled) Transfers: Sit to/from Stand Sit to Stand: Min guard         General transfer comment: Improved strength with transfers this afternoon. Good stability and improving L knee flexion  Ambulation/Gait Ambulation/Gait assistance: Min guard Ambulation Distance (Feet): 350  Feet Assistive device: Rolling walker (2 wheeled) Gait Pattern/deviations: Step-through pattern Gait velocity: Decreased   General Gait Details: Pt able to progress to step-through pattern during ambualtion. Pt utilizes 2 L/min O2 during ambulation and SaO2 remains at 92%. SaO2 88% on room air at rest. Pt denies DOE. Cues to improve L foot push off at terminal stance to improve L knee flexion during swing.    Stairs Stairs: Yes Stairs assistance: Min guard Stair Management: Step to pattern Number of Stairs: 4 General stair comments: Repeated stair training. No cues required. Pt completes safely  Wheelchair Mobility    Modified Rankin (Stroke Patients Only)       Balance Overall balance assessment: Needs assistance Sitting-balance support: No upper extremity supported Sitting balance-Leahy Scale: Good     Standing balance support: No upper extremity supported Standing balance-Leahy Scale: Fair                      Cognition Arousal/Alertness: Awake/alert Behavior During Therapy: WFL for tasks assessed/performed Overall Cognitive Status: Within Functional Limits for tasks assessed                      Exercises Total Joint Exercises Hip ABduction/ADduction: Strengthening;10 reps;Both;Seated Long Arc Quad: Strengthening;Left;10 reps;Seated Knee Flexion: Strengthening;Left;10 reps;Seated Goniometric ROM: -2 to 83 degrees AAROM, pain limited Marching in Standing: Strengthening;Both;10 reps;Seated Other Exercises Other Exercises: Seated L knee flexion stretch to patient tolerance, 10 second hold x 3    General Comments        Pertinent Vitals/Pain Pain Assessment: 0-10 Pain Score: 4  Pain Location:  L knee Pain Intervention(s): Monitored during session    Home Living                      Prior Function            PT Goals (current goals can now be found in the care plan section) Acute Rehab PT Goals Patient Stated Goal: Return home  with husband PT Goal Formulation: With patient Time For Goal Achievement: 04/30/15 Potential to Achieve Goals: Good Progress towards PT goals: Progressing toward goals    Frequency  BID    PT Plan Current plan remains appropriate    Co-evaluation             End of Session Equipment Utilized During Treatment: Gait belt;Oxygen Activity Tolerance: Patient tolerated treatment well Patient left: with call bell/phone within reach;with SCD's reapplied;in bed;with bed alarm set (towel roll under heel, polar care in place)     Time: 2956-2130 PT Time Calculation (min) (ACUTE ONLY): 25 min  Charges:  $Gait Training: 8-22 mins $Therapeutic Exercise: 8-22 mins                    G Codes:      Sharalyn Ink Huprich PT, DPT   Huprich,Jason 04/18/2015, 3:34 PM

## 2015-04-18 NOTE — Progress Notes (Signed)
Physical Therapy Treatment Patient Details Name: Rhonda Murphy MRN: [id number] DOB: 07-28-1945 Today's Date: 04/18/2015    History of Present Illness Pt underwent L total knee revision secondary to loose tibial components. No reported post-op complications. Pt reporting a lot of nausea and pain at time of PT evaluation. Refuses to perform bed mobility, transfer, or ambulation with PT but agrees to bed exercises.     PT Comments    Pt demonstrates ability to complete full lap around RN station as well as stair training. Gait speed and quality are improving. Pt continues to demonstrate drop in SaO2 with mobility and RN again notified who states she will contact ortho service. Pt has met all PT barriers to discharge and is safe to DC home with [facility] PT and husband from a physical therapy standpoint. Pt still has not had a BM and needs to meet medical criteria for discharge. Pt will benefit from skilled PT services to address deficits in strength, balance, and mobility in order to return to full function at home.    Follow Up Recommendations  Home health PT     Equipment Recommendations  None recommended by PT    Recommendations for Other Services       Precautions / Restrictions Precautions Precautions: Knee;Fall Precaution Booklet Issued: Yes (comment) Required Braces or Orthoses: Knee Immobilizer - Left Knee Immobilizer - Left: Discontinue once straight leg raise with < 10 degree lag Restrictions Weight Bearing Restrictions: Yes LLE Weight Bearing: Weight bearing as tolerated    Mobility  Bed Mobility Overal bed mobility: Needs Assistance Bed Mobility: Supine to Sit       Sit to supine: Supervision   General bed mobility comments: Pt demonstrates slow transfer from supine to sitting upright at EOB. HOB flat and no bed rails. Pt requires UE assist to adduct and flex L hip due to weakness. Pt is safe with bed mobility. Left upright in recliner  Transfers Overall transfer  level: Needs assistance Equipment used: Rolling walker (2 wheeled) Transfers: Sit to/from Stand Sit to Stand: Min guard;Min assist         General transfer comment: Pt demonstrates slight increase in need for assist with first transfer however subsequent transfers pt is able to perform with CGA only. Improving L knee flexion and improving weight shift to LLE. Toilet transfer performed with patient and she demonstrates good strength/stability.  Ambulation/Gait Ambulation/Gait assistance: Min guard Ambulation Distance (Feet): 350 Feet Assistive device: Rolling walker (2 wheeled) Gait Pattern/deviations: Step-through pattern Gait velocity: Decreased Gait velocity interpretation: <1.8 ft/sec, indicative of risk for recurrent falls General Gait Details: Pt again provided cues for increased step length on RLE to equalize step length and improve WB through LLE. Pt able to improve speed as distance progresses. Cues for decreased UE reliance. Pt denies DOE during ambulation however given history of desaturation yesterday vitals measured. SaO2 found to drop to 86-88% on room air with ambulation and does not recover at rest. Pt provided standing rest break and SaO2 does not recover. Checked SaO2 with multiple separate meters to assess accuraccy. Charge nurse, primary nurse, and care manager notified. Returned to room and performed incentive spirometer. SaO2 increased to 96%, however relatively quickly drops back to upper 80's or close to 90 following termination of incentive spirometer. Pt denies calf tenderness today. L calf is supple and not red/swollen. Nontender to light palpation.   Stairs Stairs: Yes Stairs assistance: Min guard Stair Management: Step to pattern;Two rails;Forwards Number of Stairs: 4 General stair  comments: Pt demonstrates good safety and stability with ascend/descend 4 stairs. Pt reminded of proper sequencing but good recall from prior surgery. Pt demonstrates excellent  stability and safety with stair training.  Wheelchair Mobility    Modified Rankin (Stroke Patients Only)       Balance Overall balance assessment: Needs assistance Sitting-balance support: No upper extremity supported Sitting balance-Leahy Scale: Good     Standing balance support: No upper extremity supported Standing balance-Leahy Scale: Fair                      Cognition Arousal/Alertness: Awake/alert Behavior During Therapy: WFL for tasks assessed/performed Overall Cognitive Status: Within Functional Limits for tasks assessed                      Exercises Total Joint Exercises Hip ABduction/ADduction: Strengthening;10 reps;Both;Seated Long Arc Quad: Strengthening;Left;10 reps;Seated Knee Flexion: Strengthening;Left;10 reps;Seated Marching in Standing: Strengthening;Both;10 reps;Seated    General Comments        Pertinent Vitals/Pain Pain Assessment: 0-10 Pain Score: 5  Pain Location: L knee Pain Intervention(s): Premedicated before session;Monitored during session    Home Living                      Prior Function            PT Goals (current goals can now be found in the care plan section) Acute Rehab PT Goals Patient Stated Goal: Return home with husband PT Goal Formulation: With patient Time For Goal Achievement: 04/30/15 Potential to Achieve Goals: Good Progress towards PT goals: Progressing toward goals    Frequency  BID    PT Plan Current plan remains appropriate    Co-evaluation             End of Session Equipment Utilized During Treatment: Gait belt Activity Tolerance: Patient tolerated treatment well Patient left: with call bell/phone within reach;with SCD's reapplied;in chair;with chair alarm set (towel roll under heel, polar care in place)     Time: 0321-2248 PT Time Calculation (min) (ACUTE ONLY): 38 min  Charges:  $Gait Training: 8-22 mins $Therapeutic Exercise: 8-22 mins                    G  Codes:      Rhonda Murphy PT, DPT   Rhonda Murphy 04/18/2015, 10:33 AM

## 2015-04-19 ENCOUNTER — Encounter: Payer: Self-pay | Admitting: Orthopedic Surgery

## 2015-04-19 MED ORDER — OXYCODONE HCL 5 MG PO TABS: 5.0000 mg | ORAL_TABLET | ORAL | Status: DC | PRN

## 2015-04-19 MED ORDER — ENOXAPARIN SODIUM 40 MG/0.4ML ~~LOC~~ SOLN: 40.0000 mg | SUBCUTANEOUS | Status: DC

## 2015-04-19 NOTE — Discharge Summary (Signed)
Physician Discharge Summary  Patient ID: Rhonda Murphy MRN: [id number] DOB/AGE: September 30, 1945 70 y.o.  Admit date: 04/16/2015 Discharge date: 04/19/2015  Admission Diagnoses:  LEFT TIBIAL PAIN    Discharge Diagnoses: Patient Active Problem List   Diagnosis Date Noted  . Failed total knee, left (HCC) 04/16/2015  . Primary osteoarthritis of knee 11/08/2014  . Primary localized osteoarthrosis of the knee 07/14/2014  . Leukopenia 03/25/2013  . Influenza A 03/24/2013  . Small bowel obstruction (HCC) 03/24/2013  . Fever, unspecified 03/23/2013  . Enteritis 03/21/2013  . Vomiting 03/21/2013  . LBBB (left bundle branch block) 03/21/2013  . GERD 02/11/2010  . OTHER DYSPHAGIA 02/11/2010  . ANEMIA 01/07/2010  . DIARRHEA 01/07/2010  . ABDOMINAL PAIN 01/07/2010  . CLOSTRIDIUM DIFFICILE COLITIS, HX OF 01/07/2010    Past Medical History  Diagnosis Date  . Mitral valve prolapse   . Hypertension   . Acid reflux   . Asthma   . Shingles   . C. difficile colitis   . Back pain   . Influenza A 03/24/2013  . Small bowel obstruction (HCC) 03/24/2013    SBO vs. enteritis.   . Enteritis 03/21/2013  . LBBB (left bundle branch block) 03/21/2013  . Asthma   . Arthritis      Transfusion: none   Consultants (if any):    Discharged Condition: Improved  Hospital Course: LETRICIA Murphy is an 70 y.o. female who was admitted 04/16/2015 with a diagnosis of <principal problem not specified> and went to the operating room on 04/16/2015 and underwent the above named procedures.    Surgeries: Procedure(s): TOTAL KNEE REVISION on 04/16/2015 Patient tolerated the surgery well. Taken to PACU where she was stabilized and then transferred to the orthopedic floor.  Started on Lovenox 30 q 12 hrs. Foot pumps applied bilaterally at 80 mm. Heels elevated on bed with rolled towels. No evidence of DVT. Negative Homan. Physical therapy started on day #1 for gait training and transfer. OT started day #1 for ADL and  assisted devices.  Patient's foley was d/c on day #1. Patient's IV and hemovac was d/c on day #2.  On post op day #3 patient was stable and ready for discharge to home with home health.  Implants: GMK sphere tibial components 65 mm stem with size 2 left tibial component, 14 mm flex insert  She was given perioperative antibiotics:  Anti-infectives    Start     Dose/Rate Route Frequency Ordered Stop   04/16/15 2200  hydroxychloroquine (PLAQUENIL) tablet 200 mg     200 mg Oral 2 times daily 04/16/15 1120     04/16/15 1130  ceFAZolin (ANCEF) IVPB 2 g/50 mL premix     2 g 100 mL/hr over 30 Minutes Intravenous Every 6 hours 04/16/15 1120 04/17/15 0318   04/16/15 0605  ceFAZolin (ANCEF) 2-3 GM-% IVPB SOLR    Comments:  Rhonda Murphy: cabinet override      04/16/15 0605 04/16/15 1814   04/16/15 0145  vancomycin (VANCOCIN) 500 mg in sodium chloride 0.9 % 100 mL IVPB     500 mg 100 mL/hr over 60 Minutes Intravenous  Once 04/16/15 0142 04/16/15 0748   04/16/15 0145  ceFAZolin (ANCEF) IVPB 2 g/50 mL premix     2 g 100 mL/hr over 30 Minutes Intravenous  Once 04/16/15 0142 04/16/15 0743    .  She was given sequential compression devices, early ambulation, and lovenox for DVT prophylaxis.  She benefited maximally from the hospital stay and there  were no complications.    Recent vital signs:  Filed Vitals:   04/19/15 0427 04/19/15 0735  BP: 129/49 128/58  Pulse: 75 78  Temp: 98.5 F (36.9 C) 99.2 F (37.3 C)  Resp: 18 18    Recent laboratory studies:  Lab Results  Component Value Date   HGB 10.0* 04/18/2015   HGB 11.0* 04/17/2015   HGB 11.1* 04/16/2015   Lab Results  Component Value Date   WBC 8.3 04/18/2015   PLT 200 04/18/2015   Lab Results  Component Value Date   INR 0.94 04/09/2015   Lab Results  Component Value Date   NA 133* 04/18/2015   K 3.9 04/18/2015   CL 100* 04/18/2015   CO2 27 04/18/2015   BUN 10 04/18/2015   CREATININE 0.78 04/18/2015   GLUCOSE 108*  04/18/2015    Discharge Medications:     Medication List    TAKE these medications        acetaminophen 650 MG CR tablet  Commonly known as:  TYLENOL  Take 650 mg by mouth 2 (two) times daily as needed for pain. 2 tablets orally twice a day     amitriptyline 25 MG tablet  Commonly known as:  ELAVIL  Take 25 mg by mouth at bedtime.     amLODipine 10 MG tablet  Commonly known as:  NORVASC  Take 10 mg by mouth daily.     CENTRUM SILVER ADULT 50+ PO  Take by mouth.     cetirizine 10 MG tablet  Commonly known as:  ZYRTEC  Take 10 mg by mouth daily. 1 tab orally  Once a day, As needed     diclofenac 75 MG EC tablet  Commonly known as:  VOLTAREN  Take 75 mg by mouth 2 (two) times daily.     diphenhydramine-acetaminophen 25-500 MG Tabs tablet  Commonly known as:  TYLENOL PM  Take 1 tablet by mouth at bedtime as needed. Reported on 04/16/2015     enoxaparin 40 MG/0.4ML injection  Commonly known as:  LOVENOX  Inject 0.4 mLs (40 mg total) into the skin daily.     EPIPEN 0.3 mg/0.3 mL Soaj injection  Generic drug:  EPINEPHrine  Inject 0.3 mg into the muscle once as needed. Reported on 04/16/2015     fluticasone 50 MCG/ACT nasal spray  Commonly known as:  FLONASE  Place 1 spray into both nostrils daily.     Fluticasone Furoate-Vilanterol 100-25 MCG/INH Aepb  Inhale 1 puff into the lungs daily.     hydrochlorothiazide 12.5 MG capsule  Commonly known as:  MICROZIDE  Take 12.5 mg by mouth daily.     HYDROcodone-acetaminophen 5-325 MG tablet  Commonly known as:  NORCO/VICODIN  Take 1 tablet by mouth every 6 (six) hours as needed for moderate pain.     hydroxychloroquine 200 MG tablet  Commonly known as:  PLAQUENIL  Take 1 tablet (200 mg total) by mouth 2 (two) times daily.     LINZESS PO  Take by mouth as needed.     losartan 100 MG tablet  Commonly known as:  COZAAR  Take 100 mg by mouth daily.     montelukast 10 MG tablet  Commonly known as:  SINGULAIR  Take 10  mg by mouth at bedtime as needed.     MUCINEX DM 30-600 MG Tb12  Take 1 tablet by mouth 2 (two) times daily as needed.     NASACORT ALLERGY 24HR 55 MCG/ACT Aero nasal inhaler  Generic drug:  triamcinolone  Place 2 sprays into the nose daily as needed.     nebivolol 10 MG tablet  Commonly known as:  BYSTOLIC  Take 10 mg by mouth daily.     nitroGLYCERIN 0.4 MG SL tablet  Commonly known as:  NITROSTAT  Place 0.4 mg under the tongue every 5 (five) minutes as needed for chest pain. Reported on 04/16/2015     oxyCODONE 5 MG immediate release tablet  Commonly known as:  Oxy IR/ROXICODONE  Take 1-2 tablets (5-10 mg total) by mouth every 3 (three) hours as needed for breakthrough pain.     potassium chloride 10 MEQ tablet  Commonly known as:  K-DUR,KLOR-CON  Take 10 mEq by mouth daily.     PROBIOTIC DAILY PO  Take 1 capsule by mouth daily.     ranitidine 150 MG capsule  Commonly known as:  ZANTAC  Take 150 mg by mouth 2 (two) times daily.     senna-docusate 8.6-50 MG tablet  Commonly known as:  Senokot-S  Take 1 tablet by mouth 2 (two) times daily.     simvastatin 40 MG tablet  Commonly known as:  ZOCOR  Take 40 mg by mouth every evening.     tiZANidine 2 MG tablet  Commonly known as:  ZANAFLEX  Take by mouth 3 (three) times daily as needed for muscle spasms.     triamcinolone cream 0.1 %  Commonly known as:  KENALOG  Apply 1 application topically 2 (two) times daily.        Diagnostic Studies: Dg Chest 2 View  04/18/2015  CLINICAL DATA:  2 days postop revision left total knee arthroplasty. Acute onset of hypoxia earlier today. EXAM: CHEST  2 VIEW COMPARISON:  None. FINDINGS: Cardiac silhouette normal in size. Thoracic aorta mildly atherosclerotic. Hilar and mediastinal contours otherwise unremarkable. Mild atelectasis in the lower lobes associated with very small bilateral pleural effusions. Lungs otherwise clear. Bronchovascular markings normal. No localized airspace  consolidation. No pneumothorax. Normal pulmonary vascularity. Visualized bony thorax intact. IMPRESSION: Small bilateral pleural effusions associated with mild bilateral lower lobe atelectasis. No acute cardiopulmonary disease otherwise. Electronically Signed   By: Hulan Saas M.D.   On: 04/18/2015 12:51   Dg Knee 1-2 Views Left  04/16/2015  CLINICAL DATA:  Postop left knee replacement EXAM: LEFT KNEE - 1-2 VIEW COMPARISON:  11/08/2014 FINDINGS: Revision of the tibial component of the left knee replacement. No visible hardware or bony complicating feature. Soft tissue and joint space gas. IMPRESSION: Revision of the tibial component of the left knee replacement. No complicating feature. Electronically Signed   By: Charlett Nose M.D.   On: 04/16/2015 10:18    Disposition: home with home health PT       Follow-up Information    Follow up with MENZ,MICHAEL, MD In 2 weeks.   Specialty:  Orthopedic Surgery   Why:  For staple removal and skin check   Contact information:   [address] [facility] [place] [address] [phone]        Signed: Amador Cunas [facility] 04/19/2015, 8:04 AM

## 2015-04-19 NOTE — Progress Notes (Signed)
   Subjective: 3 Days Post-Op Procedure(s) (LRB): TOTAL KNEE REVISION (Left) Patient reports pain as mild.   Patient is well, and has had no acute complaints or problems Denies any CP, SOB, ABD pain. We will continue therapy today. Tolerated therapy well yesterday, O2 sats stable. Plan is to go Home after hospital stay.  Objective: Vital signs in last 24 hours: Temp:  [98.2 F (36.8 C)-99.2 F (37.3 C)] 99.2 F (37.3 C) (02/03 0735) Pulse Rate:  [75-89] 78 (02/03 0735) Resp:  [17-18] 18 (02/03 0735) BP: (126-134)/(49-62) 128/58 mmHg (02/03 0735) SpO2:  [88 %-95 %] 91 % (02/03 0735)  Intake/Output from previous day: 02/02 0701 - 02/03 0700 In: 720 [P.O.:720] Out: 0  Intake/Output this shift:     Recent Labs  04/16/15 1153 04/17/15 0646 04/18/15 0646  HGB 11.1* 11.0* 10.0*    Recent Labs  04/17/15 0646 04/18/15 0646  WBC 9.3 8.3  RBC 3.70* 3.37*  HCT 32.7* 29.6*  PLT 218 200    Recent Labs  04/17/15 0646 04/18/15 0646  NA 134* 133*  K 4.1 3.9  CL 102 100*  CO2 27 27  BUN 11 10  CREATININE 0.94 0.78  GLUCOSE 116* 108*  CALCIUM 8.4* 8.4*   No results for input(s): LABPT, INR in the last 72 hours.  EXAM General - Patient is Alert, Appropriate and Oriented Extremity - Neurovascular intact Sensation intact distally Intact pulses distally Dorsiflexion/Plantar flexion intact Dressing - dressing C/D/I and no drainage,  Motor Function - intact, moving foot and toes well on exam.   Past Medical History  Diagnosis Date  . Mitral valve prolapse   . Hypertension   . Acid reflux   . Asthma   . Shingles   . C. difficile colitis   . Back pain   . Influenza A 03/24/2013  . Small bowel obstruction (HCC) 03/24/2013    SBO vs. enteritis.   . Enteritis 03/21/2013  . LBBB (left bundle branch block) 03/21/2013  . Asthma   . Arthritis     Assessment/Plan:   3 Days Post-Op Procedure(s) (LRB): TOTAL KNEE REVISION (Left) Active Problems:   Failed total knee,  left (HCC)  Estimated body mass index is 36.22 kg/(m^2) as calculated from the following:   Height as of this encounter:  (1.549 m).   Weight as of this encounter: 86.909 kg (191 lb 9.6 oz). Advance diet Up with therapy  Plan on discharge to home with home health Today.  Follow up with [facility] in 2 weeks  DVT Prophylaxis - Lovenox, Foot Pumps and TED hose Weight-Bearing as tolerated to left leg D/C O2 and Pulse OX and try on Room Air  T. Cranston Neighbor, PA-C [facility] 04/19/2015, 8:00 AM

## 2015-04-19 NOTE — Progress Notes (Signed)
o2 sat down to 85 when ambulating on room air. Chris gaines informed. Chest xray ordered

## 2015-04-19 NOTE — Care Management (Addendum)
Unable to get fax to go through to [facility] home health after multiple attempts. Emailed SECURE orders to [email or web address]. Patient will not need home O2. No further RNCM needs. Case closed.

## 2015-04-19 NOTE — Discharge Instructions (Signed)

## 2015-04-19 NOTE — Progress Notes (Signed)
Physical Therapy Treatment Patient Details Name: Rhonda Murphy MRN: [id number] DOB: January 04, 1946 Today's Date: 04/19/2015    History of Present Illness Pt underwent L total knee revision secondary to loose tibial components. No reported post-op complications. Pt reporting a lot of nausea and pain at time of PT evaluation. Refuses to perform bed mobility, transfer, or ambulation with PT but agrees to bed exercises.     PT Comments    Pt demonstrates excellent progress with therapy. She increases ambulation distance and is able to complete the stairs today with unilateral railing. Pt completes all seated and standing exercises and AAROM L knee is progressing appropriately. Pt has met all PT barriers to discharge and is appropriate to discharge home with [facility] PT when medically appropriate. Pt will benefit from skilled PT services to address deficits in strength, balance, and mobility in order to return to full function at home.    Follow Up Recommendations  Home health PT     Equipment Recommendations  None recommended by PT    Recommendations for Other Services       Precautions / Restrictions Precautions Precautions: Knee;Fall Precaution Booklet Issued: Yes (comment) Required Braces or Orthoses: Knee Immobilizer - Left Knee Immobilizer - Left: Discontinue once straight leg raise with < 10 degree lag Restrictions Weight Bearing Restrictions: Yes LLE Weight Bearing: Weight bearing as tolerated    Mobility  Bed Mobility               General bed mobility comments: Received up in recliner and left in recliner  Transfers Overall transfer level: Needs assistance Equipment used: Rolling walker (2 wheeled) Transfers: Sit to/from Stand Sit to Stand: Min guard         General transfer comment: Pt continues to progress with transfers. Demonstrates increased weight acceptance to LLE. Good stability once upright in standing  Ambulation/Gait Ambulation/Gait assistance: Min  guard Ambulation Distance (Feet): 410 Feet Assistive device: Rolling walker (2 wheeled) Gait Pattern/deviations: Step-through pattern Gait velocity: Decreased Gait velocity interpretation: <1.8 ft/sec, indicative of risk for recurrent falls General Gait Details: Pt demonstrate excellent progress with gait speed and distance. She naturally progresses to full step-through pattern. Cues provided for increased push of with L foot at terminal stance and improved heel strike at initial contact. Increased ambulation distance with patient. SaO2 drops intermittently to 88% on room air during ambulation. With standing rest break and deep breathing pt able to recover to 92%. Pt able to practice about 30' of ambulation in rehab gym with single point cane. Pt demonstrates excellent weight acceptance to LLE and good stability. No evidence of LLE buckling.    Stairs Stairs: Yes Stairs assistance: Min guard Stair Management: Step to pattern Number of Stairs: 8 (4+4) General stair comments: Repeated stair training but no cues required for bilateral rails. Repeated with L unilateral rail with both UEs on railing to simulate home environment. Pt able to complete safely and confidently.   Wheelchair Mobility    Modified Rankin (Stroke Patients Only)       Balance Overall balance assessment: Needs assistance Sitting-balance support: No upper extremity supported Sitting balance-Leahy Scale: Good     Standing balance support: No upper extremity supported Standing balance-Leahy Scale: Fair                      Cognition Arousal/Alertness: Awake/alert Behavior During Therapy: WFL for tasks assessed/performed Overall Cognitive Status: Within Functional Limits for tasks assessed  Exercises Total Joint Exercises Hip ABduction/ADduction: Strengthening;10 reps;Both;Seated Long Arc Quad: Strengthening;Left;10 reps;Seated Knee Flexion: Strengthening;Left;10  reps;Seated Goniometric ROM: -2 to 90 degrees AAROM, pain limited Marching in Standing: Strengthening;Both;10 reps;Seated Other Exercises Other Exercises: Standing marches x 10, standing mini squats x 10    General Comments        Pertinent Vitals/Pain Pain Assessment: 0-10 Pain Score: 3  Pain Location: L knee Pain Descriptors / Indicators: Aching Pain Intervention(s): Limited activity within patient's tolerance;Monitored during session;Premedicated before session    Home Living                      Prior Function            PT Goals (current goals can now be found in the care plan section) Acute Rehab PT Goals Patient Stated Goal: Return home with husband PT Goal Formulation: With patient Time For Goal Achievement: 04/30/15 Potential to Achieve Goals: Good Progress towards PT goals: Progressing toward goals    Frequency  BID    PT Plan Current plan remains appropriate    Co-evaluation             End of Session Equipment Utilized During Treatment: Gait belt Activity Tolerance: Patient tolerated treatment well Patient left: with call bell/phone within reach;with SCD's reapplied;in chair (towel roll under heel, polar care in place)     Time: 4680-3212 PT Time Calculation (min) (ACUTE ONLY): 33 min  Charges:  $Gait Training: 8-22 mins $Therapeutic Exercise: 8-22 mins                    G Codes:      [name] PT, DPT   Huprich,Jason 04/19/2015, 9:39 AM

## 2017-03-16 HISTORY — PX: RENAL ARTERY STENT: SHX2321

## 2017-07-28 ENCOUNTER — Encounter: Payer: Self-pay | Admitting: Gastroenterology

## 2017-07-28 ENCOUNTER — Ambulatory Visit (INDEPENDENT_AMBULATORY_CARE_PROVIDER_SITE_OTHER): Payer: Medicare Other | Admitting: Gastroenterology

## 2017-07-28 ENCOUNTER — Encounter

## 2017-07-28 VITALS — BP 173/83 | HR 64 | Temp 97.9°F | Ht 63.0 in | Wt 149.0 lb

## 2017-07-28 DIAGNOSIS — R1032 Left lower quadrant pain: Secondary | ICD-10-CM | POA: Diagnosis not present

## 2017-07-28 NOTE — Patient Instructions (Addendum)
I have ordered a CT scan for further evaluation (no IV contrast, only the oral contrast).  I would like for you to take Metamucil or Benefiber daily as according to instructions provided on product that you choose.  I have provided Linzess 290 micrograms to take each morning, 30 minutes before breakfast. Ideally, you would take this daily. However, we could try every other day. You could start with just fiber and see how this does. If needed, you will have this Linzess.  We will see you back in 2 months!  It was a pleasure to see you today. I strive to create trusting relationships with patients to provide genuine, compassionate, and quality care. I value your feedback. If you receive a survey regarding your visit,  I greatly appreciate you taking time to fill this out.   Gelene Mink, PhD, ANP-BC [facility]

## 2017-07-28 NOTE — Progress Notes (Signed)
Primary Care Physician:  Romeo Rabon, MD Primary Gastroenterologist:  Dr. Jena Gauss   Chief Complaint  Patient presents with  . left side pain    x 6 months, comes/goes, starts in LLQ and goes up into rib cage into back  . Constipation    Straining with BM's, has to massage abd/sides to help with BM    HPI:   Rhonda Murphy is a 72 y.o. female presenting today with abdominal pain. Last seen by [facility] in 2011. History of chronic constipation. Notes pain has been worsening over the past 6 months. Prescribed Linzess as needed by PCP. Not taking this every day. She states she takes whatever sample dosage is available. Prescriptions are expensive, so she takes samples. Will vary between low to highest dosage. Has tried Amitiza in the past, which was more expensive than the Linzess. Senokot does not help.   BMs start out as hard then gets softer as it goes. Will feel a ball in LLQ that radiates up her left rib cage and radiates around waist line to back. When she gets this pain, she knows she needs to take Linzess to relieve. The "ball" feeling and pain is new in the last 6 months. Pain is not constant. This morning had a BM but still has discomfort left-sided abdomen. Sunday had same pain and then had diarrhea after pain resolved. Will go 3-4 days without a BM. Will sometimes use suppositories to help with BMs, drink more liquids, eat more fiber. Linzess usually every 2-3 weeks. States she has done a fecal test that was negative for blood. States she has seen scant hematochezia with straining.    History of CDI in 2011 requiring hospitalization. Last colonoscopy appears to have been in 2010 by Dr. Samuella Cota, but I do not have these records. Reportedly normal per office visit notes.   History of bowel obstruction in 2015: worried this is going on again.   States recently she was at her PCP office and thought she was having a heart attack. Went to [facility] and states they "ruled this out". Referred to  Dr. Beckie Salts, cardiologist. Completed cardiac monitor. Will return in 7 months.   Establishing care with Dr. Letitia Libra for PCP needs at end of May 2019, associated with [facility].    Past Medical History:  Diagnosis Date  . Acid reflux   . Arthritis   . Asthma   . Asthma   . Back pain   . C. difficile colitis 2011  . Enteritis 03/21/2013  . Hypertension   . Influenza A 03/24/2013  . LBBB (left bundle branch block) 03/21/2013  . Mitral valve prolapse   . Shingles   . Small bowel obstruction (HCC) 03/24/2013   SBO vs. enteritis.     Past Surgical History:  Procedure Laterality Date  . ABDOMINAL HYSTERECTOMY    . APPENDECTOMY    . CHOLECYSTECTOMY    . COLONOSCOPY  2010   Dr. Samuella Cota  . ESOPHAGOGASTRODUODENOSCOPY  2011   Dr. Jena Gauss: patent Schatzki's ring s/p dilation, esophageal biopsies negative, duodenal biopsies negative. Intolerant to PPIs in past and took H2 blockers  . EXPLORATORY LAPAROTOMY     in her 30s, removed appendix, hysterectomy, cholecystectomy, varicose veins in abdomen  . JOINT REPLACEMENT     Rt knee  . RENAL ARTERY STENT  2019   [place]  . TONSILLECTOMY    . TOTAL KNEE ARTHROPLASTY Left 11/08/2014   Procedure: TOTAL KNEE ARTHROPLASTY;  Surgeon: Kennedy Bucker, MD;  Location: [facility]  ORS;  Service: Orthopedics;  Laterality: Left;  . TOTAL KNEE REVISION Left 04/16/2015   Procedure: TOTAL KNEE REVISION;  Surgeon: Kennedy Bucker, MD;  Location: [facility] ORS;  Service: Orthopedics;  Laterality: Left;    Current Outpatient Medications  Medication Sig Dispense Refill  . acetaminophen (TYLENOL) 650 MG CR tablet Take 650 mg by mouth 2 (two) times daily as needed for pain.     Marland Kitchen amitriptyline (ELAVIL) 25 MG tablet Take 25 mg by mouth at bedtime.    Marland Kitchen amLODipine (NORVASC) 10 MG tablet Take 10 mg by mouth daily.    Marland Kitchen docusate sodium (COLACE) 100 MG capsule 1 tablet in the AM and 2 in the PM    . Fluticasone Furoate-Vilanterol 100-25 MCG/INH AEPB Inhale 1 puff into the lungs daily.    .  hydrochlorothiazide (HYDRODIURIL) 25 MG tablet Take 25 mg by mouth daily.     . hydroxychloroquine (PLAQUENIL) 200 MG tablet Take 1 tablet (200 mg total) by mouth 2 (two) times daily. 30 tablet 0  . levocetirizine (XYZAL) 5 MG tablet Take 5 mg by mouth every evening.    . Linaclotide (LINZESS PO) Take by mouth as needed.    Marland Kitchen losartan (COZAAR) 100 MG tablet Take 100 mg by mouth daily.    . metoprolol tartrate (LOPRESSOR) 50 MG tablet Take 50 mg by mouth 3 (three) times daily.    . montelukast (SINGULAIR) 10 MG tablet Take 10 mg by mouth at bedtime as needed.     . Multiple Vitamins-Minerals (CENTRUM SILVER ADULT 50+ PO) Take by mouth.    . nitroGLYCERIN (NITROSTAT) 0.4 MG SL tablet Place 0.4 mg under the tongue every 5 (five) minutes as needed for chest pain. Reported on 04/16/2015    . ranitidine (ZANTAC) 150 MG capsule Take 150 mg by mouth 2 (two) times daily.    . simvastatin (ZOCOR) 40 MG tablet Take 40 mg by mouth every evening.    . triamcinolone (NASACORT ALLERGY 24HR) 55 MCG/ACT AERO nasal inhaler Place 2 sprays into the nose daily as needed.     No current facility-administered medications for this visit.     Allergies as of 07/28/2017 - Review Complete 07/28/2017  Allergen Reaction Noted  . Influenza vaccines Rash 03/21/2013  . Sulfa antibiotics Anaphylaxis 03/21/2013  . Esomeprazole magnesium    . Iodine  10/25/2014  . Lipitor [atorvastatin] Other (See Comments) 07/13/2014  . Norvasc [amlodipine besylate]  10/25/2014  . Omeprazole    . Other Other (See Comments) 07/13/2014  . Pantoprazole sodium    . Prevacid [lansoprazole] Other (See Comments) 07/13/2014  . Rabeprazole sodium    . Spironolactone Other (See Comments) 07/13/2014  . Sulfonamide derivatives    . Terazosin Other (See Comments) 07/13/2014  . Tigan [trimethobenzamide hcl] Other (See Comments) 07/13/2014  . Zithromax [azithromycin] Other (See Comments) 07/13/2014    Family History  Problem Relation Age of  Onset  . Breast cancer Mother 40  . Colon cancer Neg Hx   . Colon polyps Neg Hx     Social History   Socioeconomic History  . Marital status: Married    Spouse name: Not on file  . Number of children: Not on file  . Years of education: Not on file  . Highest education level: Not on file  Occupational History  . Not on file  Social Needs  . Financial resource strain: Not on file  . Food insecurity:    Worry: Not on file    Inability: Not  on file  . Transportation needs:    Medical: Not on file    Non-medical: Not on file  Tobacco Use  . Smoking status: Never Smoker  . Smokeless tobacco: Never Used  Substance and Sexual Activity  . Alcohol use: Yes    Comment: rare wine  . Drug use: No  . Sexual activity: Not on file  Lifestyle  . Physical activity:    Days per week: Not on file    Minutes per session: Not on file  . Stress: Not on file  Relationships  . Social connections:    Talks on phone: Not on file    Gets together: Not on file    Attends religious service: Not on file    Active member of club or organization: Not on file    Attends meetings of clubs or organizations: Not on file    Relationship status: Not on file  . Intimate partner violence:    Fear of current or ex partner: Not on file    Emotionally abused: Not on file    Physically abused: Not on file    Forced sexual activity: Not on file  Other Topics Concern  . Not on file  Social History Narrative  . Not on file    Review of Systems: Gen: Denies any fever, chills, fatigue, weight loss, lack of appetite.  CV: Denies chest pain, heart palpitations, peripheral edema, syncope.  Resp: Denies shortness of breath at rest or with exertion. Denies wheezing or cough.  GI: see HPI  GU : Denies urinary burning, urinary frequency, urinary hesitancy MS: Denies joint pain, muscle weakness, cramps, or limitation of movement.  Derm: Denies rash, itching, dry skin Psych: Denies depression, anxiety, memory  loss, and confusion Heme: see HPI   Physical Exam: BP (!) 173/83   Pulse 64   Temp 97.9 F (36.6 C) (Oral)   Ht  (1.6 m)   Wt 149 lb (67.6 kg)   BMI 26.39 kg/m  General:   Alert and oriented. Pleasant and cooperative. Well-nourished and well-developed.  Head:  Normocephalic and atraumatic. Eyes:  Without icterus, sclera clear and conjunctiva pink.  Ears:  Normal auditory acuity. Nose:  No deformity, discharge,  or lesions. Mouth:  No deformity or lesions, oral mucosa pink.  Lungs:  Clear to auscultation bilaterally. No wheezes, rales, or rhonchi. No distress.  Heart:  S1, S2 present without murmurs appreciated.  Abdomen:  +BS, soft, non-tender and non-distended. No HSM noted. No guarding or rebound. No masses appreciated.  Rectal:  Deferred  Msk:  Symmetrical without gross deformities. Normal posture. Extremities:  Without  edema. Neurologic:  Alert and  oriented x4 Psych:  Alert and cooperative. Normal mood and affect.

## 2017-08-04 NOTE — Assessment & Plan Note (Signed)
72 year old female with LLQ pain in setting of chronic constipation not ideally managed. Unfortunately, prescriptions for Linzess and Amitiza have been expensive, so she takes Linzess samples as needed. Last colonoscopy reportedly in 2010 by Dr. Samuella Cota, but I do not have these results at time of visit. As discomfort is persistent and at times not completed related to bowel habits, will pursue CT. She has noted low volume hematochezia in setting of straining, so we may need to consider colonoscopy. Will await CT findings first.   Start adding supplemental fiber daily. Linzess 290 mcg samples provided to help in interim.  CT with only oral contrast, NOT IV due to iodine allergy 2 month follow-up regardless: she may need colonoscopy in interim. Will await CT.

## 2017-08-04 NOTE — Progress Notes (Signed)
CC'D TO PCP °

## 2017-08-16 ENCOUNTER — Ambulatory Visit (HOSPITAL_COMMUNITY)
Admission: RE | Admit: 2017-08-16 | Discharge: 2017-08-16 | Disposition: A | Discharge status: Home / Self Care | Payer: Medicare Other | Source: Ambulatory Visit | Attending: Gastroenterology | Admitting: Gastroenterology

## 2017-08-16 DIAGNOSIS — I700 Atherosclerosis of aorta: Secondary | ICD-10-CM | POA: Insufficient documentation

## 2017-08-16 DIAGNOSIS — R1032 Left lower quadrant pain: Secondary | ICD-10-CM | POA: Diagnosis present

## 2017-08-20 ENCOUNTER — Telehealth: Payer: Self-pay | Admitting: Internal Medicine

## 2017-08-20 NOTE — Telephone Encounter (Signed)
Sent Mychart message.

## 2017-08-20 NOTE — Telephone Encounter (Signed)
Pt was calling to see if her CT results were available. Please call [phone]

## 2017-08-20 NOTE — Telephone Encounter (Signed)
Forwarding to Anna Boone, NP for results.  

## 2017-08-20 NOTE — Progress Notes (Signed)
Mrs. Rhonda Murphy: no findings of diverticulitis or other concerning things that would be causing your pain. This is likely due to constipation. How are you doing? ([facility] [facility])

## 2017-10-22 ENCOUNTER — Encounter: Payer: Self-pay | Admitting: Gastroenterology

## 2017-10-22 ENCOUNTER — Other Ambulatory Visit: Payer: Self-pay | Admitting: *Deleted

## 2017-10-22 ENCOUNTER — Ambulatory Visit (INDEPENDENT_AMBULATORY_CARE_PROVIDER_SITE_OTHER): Payer: Medicare Other | Admitting: Gastroenterology

## 2017-10-22 ENCOUNTER — Encounter: Payer: Self-pay | Admitting: *Deleted

## 2017-10-22 DIAGNOSIS — Z1211 Encounter for screening for malignant neoplasm of colon: Secondary | ICD-10-CM

## 2017-10-22 MED ORDER — NA SULFATE-K SULFATE-MG SULF 17.5-3.13-1.6 GM/177ML PO SOLN: 1.0000 | ORAL | 0 refills | Status: DC

## 2017-10-22 NOTE — Progress Notes (Signed)
Referring Provider: Romeo Rabonaplan, Michael, MD Primary Care Physician:  Romeo Rabonaplan, Michael, MD  Primary GI: Dr. Jena Gaussourk   Chief Complaint  Patient presents with  . Follow-up    LLQ pain has improved and better Bm's    HPI:   Rhonda Murphy is a 72 y.o. female presenting today with a history of chronic constipation, last seen May 2019 with abdominal pain, LLQ. Linzess and Amitiza have been expensive historically, so she takes Linzess 145 mcg samples as needed. I had her start supplemental fiber daily at last visit to assist with constipation. Due to abdominal pain, CT completed and unrevealing for acute process.    Last colonoscopy appears to have been in 2010 by Dr. Samuella CotaPandya, but I do not have these records. Reportedly normal per office visit notes.   Benefiber helps. Fiber one gummy. Occasional LLQ discomfort but much better. Trying to increase fruits and veggies. Will take Linzess 145 mcg as needed. Takes it only every few weeks. She would like to pursue a colonoscopy now. She has seen faint hematochezia with straining in the past.   Past Medical History:  Diagnosis Date  . Acid reflux   . Arthritis   . Asthma   . Asthma   . Back pain   . C. difficile colitis 2011  . Enteritis 03/21/2013  . Hypertension   . Influenza A 03/24/2013  . LBBB (left bundle branch block) 03/21/2013  . Mitral valve prolapse   . Shingles   . Small bowel obstruction (HCC) 03/24/2013   SBO vs. enteritis.     Past Surgical History:  Procedure Laterality Date  . ABDOMINAL HYSTERECTOMY    . APPENDECTOMY    . CHOLECYSTECTOMY    . COLONOSCOPY  2010   Dr. Samuella CotaPandya  . ESOPHAGOGASTRODUODENOSCOPY  2011   Dr. Jena Gaussourk: patent Schatzki's ring s/p dilation, esophageal biopsies negative, duodenal biopsies negative. Intolerant to PPIs in past and took H2 blockers  . EXPLORATORY LAPAROTOMY     in her 30s, removed appendix, hysterectomy, cholecystectomy, varicose veins in abdomen  . JOINT REPLACEMENT     Rt knee  . RENAL ARTERY  STENT  2019   [place]  . TONSILLECTOMY    . 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 TOTAL KNEE REVISION Left 04/16/2015   Procedure: TOTAL KNEE REVISION;  Surgeon: Kennedy BuckerMichael Menz, MD;  Location: [facility] ORS;  Service: Orthopedics;  Laterality: Left;    Current Outpatient Medications  Medication Sig Dispense Refill  . acetaminophen (TYLENOL) 650 MG CR tablet Take 650 mg by mouth 2 (two) times daily as needed for pain.     Marland Kitchen. amitriptyline (ELAVIL) 25 MG tablet Take 25 mg by mouth at bedtime.    Marland Kitchen. amLODipine (NORVASC) 10 MG tablet Take 10 mg by mouth daily.    Marland Kitchen. docusate sodium (COLACE) 100 MG capsule 1 tablet in the AM and 2 in the PM    . hydrochlorothiazide (HYDRODIURIL) 25 MG tablet Take 25 mg by mouth daily.     . hydroxychloroquine (PLAQUENIL) 200 MG tablet Take 1 tablet (200 mg total) by mouth 2 (two) times daily. 30 tablet 0  . levocetirizine (XYZAL) 5 MG tablet Take 5 mg by mouth every evening.    . Linaclotide (LINZESS PO) Take by mouth as needed.    Marland Kitchen. losartan (COZAAR) 100 MG tablet Take 100 mg by mouth daily.    .Marland Kitchen  montelukast (SINGULAIR) 10 MG tablet Take 10 mg by mouth at bedtime as needed.     . Multiple Vitamins-Minerals (CENTRUM SILVER ADULT 50+ PO) Take by mouth.    . NON FORMULARY Breo    As directed    . ranitidine (ZANTAC) 150 MG capsule Take 150 mg by mouth 2 (two) times daily.    . simvastatin (ZOCOR) 40 MG tablet Take 40 mg by mouth every evening.    . triamcinolone (NASACORT ALLERGY 24HR) 55 MCG/ACT AERO nasal inhaler Place 2 sprays into the nose daily as needed.    . Fluticasone Furoate-Vilanterol 100-25 MCG/INH AEPB Inhale 1 puff into the lungs daily.    . metoprolol tartrate (LOPRESSOR) 50 MG tablet Take 50 mg by mouth 3 (three) times daily.    . nitroGLYCERIN (NITROSTAT) 0.4 MG SL tablet Place 0.4 mg under the tongue every 5 (five) minutes  as needed for chest pain. Reported on 04/16/2015     No current facility-administered medications for this visit.     Allergies as of 10/22/2017 - Review Complete 10/22/2017  Allergen Reaction Noted  . Influenza vaccines Rash 03/21/2013  . Sulfa antibiotics Anaphylaxis 03/21/2013  . Esomeprazole magnesium    . Iodine  10/25/2014  . Lipitor [atorvastatin] Other (See Comments) 07/13/2014  . Norvasc [amlodipine besylate]  10/25/2014  . Omeprazole    . Other Other (See Comments) 07/13/2014  . Pantoprazole sodium    . Prevacid [lansoprazole] Other (See Comments) 07/13/2014  . Rabeprazole sodium    . Spironolactone Other (See Comments) 07/13/2014  . Sulfonamide derivatives    . Terazosin Other (See Comments) 07/13/2014  . Tigan [trimethobenzamide hcl] Other (See Comments) 07/13/2014  . Zithromax [azithromycin] Other (See Comments) 07/13/2014    Family History  Problem Relation Age of Onset  . Breast cancer Mother 59  . Colon cancer Neg Hx   . Colon polyps Neg Hx     Social History   Socioeconomic History  . Marital status: Married    Spouse name: Not on file  . Number of children: Not on file  . Years of education: Not on file  . Highest education level: Not on file  Occupational History  . Not on file  Social Needs  . Financial resource strain: Not on file  . Food insecurity:    Worry: Not on file    Inability: Not on file  . Transportation needs:    Medical: Not on file    Non-medical: Not on file  Tobacco Use  . Smoking status: Never Smoker  . Smokeless tobacco: Never Used  Substance and Sexual Activity  . Alcohol use: Yes    Comment: rare wine  . Drug use: No  . Sexual activity: Not on file  Lifestyle  . Physical activity:    Days per week: Not on file    Minutes per session: Not on file  . Stress: Not on file  Relationships  . Social connections:    Talks on phone: Not on file    Gets together: Not on file    Attends religious service: Not on file     Active member of club or organization: Not on file    Attends meetings of clubs or organizations: Not on file    Relationship status: Not on file  Other Topics Concern  . Not on file  Social History Narrative  . Not on file    Review of Systems: Gen: Denies fever, chills, anorexia. Denies fatigue, weakness, weight loss.  CV: Denies chest pain, palpitations, syncope, peripheral edema, and claudication. Resp: Denies dyspnea at rest, cough, wheezing, coughing up blood, and pleurisy. GI:see HPI  Derm: Denies rash, itching, dry skin Psych: Denies depression, anxiety, memory loss, confusion. No homicidal or suicidal ideation.  Heme: Denies bruising, bleeding, and enlarged lymph nodes.  Physical Exam: BP (!) 170/75   Pulse 81   Temp (!) 96.9 F (36.1 C) (Oral)   Ht 5\' 3"  (1.6 m)   Wt 150 lb 9.6 oz (68.3 kg)   BMI 26.68 kg/m  General:   Alert and oriented. No distress noted. Pleasant and cooperative.  Head:  Normocephalic and atraumatic. Eyes:  Conjuctiva clear without scleral icterus. Mouth:  Oral mucosa pink and moist. Good dentition. No lesions. Lungs: Clear to auscultation bilaterally Cardiac: S1 S2 present without murmurs  Abdomen:  +BS, soft, non-tender and non-distended. No rebound or guarding. No HSM or masses noted. Msk:  Symmetrical without gross deformities. Normal posture. Extremities:  Without edema. Neurologic:  Alert and  oriented x4 Psych:  Alert and cooperative. Normal mood and affect.

## 2017-10-22 NOTE — Patient Instructions (Signed)
We have arranged a colonoscopy with Dr. Jena Gaussourk in the near future.  Further recommendations to follow!  Continue the fiber and Linzess as you are doing. I am glad you are doing better!  It was a pleasure to see you today. I strive to create trusting relationships with patients to provide genuine, compassionate, and quality care. I value your feedback. If you receive a survey regarding your visit,  I greatly appreciate you taking time to fill this out.   Gelene MinkAnna W. Corie Allis, PhD, ANP-BC [facility]

## 2017-10-25 ENCOUNTER — Telehealth: Payer: Self-pay | Admitting: Internal Medicine

## 2017-10-25 ENCOUNTER — Telehealth: Payer: Self-pay | Admitting: *Deleted

## 2017-10-25 ENCOUNTER — Ambulatory Visit: Payer: Self-pay | Admitting: Gastroenterology

## 2017-10-25 NOTE — Telephone Encounter (Signed)
See prior phone note. 

## 2017-10-25 NOTE — Telephone Encounter (Signed)
Per Eber Jonesarolyn from endo they will leave pre-op as already scheduled. Patient aware.

## 2017-10-25 NOTE — Telephone Encounter (Signed)
Pt was returning a call from MS> [phone]

## 2017-10-25 NOTE — Telephone Encounter (Signed)
Spoke with patient. She had her dates messed up, she is going on be on vacation 12/09/17. We have r/s'd TCS to 12/02/17 at 2:30pm. New instructions have been mailed to her

## 2017-10-25 NOTE — Telephone Encounter (Signed)
LMOVM

## 2017-10-25 NOTE — Telephone Encounter (Signed)
[phone]  PATIENT NEEDS TO RESCHEDULE HER PROCEDURE

## 2017-10-25 NOTE — Telephone Encounter (Signed)
Pre-op scheduled for 12/01/17 at 8:00am. Letter mailed. LMOVM.

## 2017-10-26 NOTE — Assessment & Plan Note (Signed)
72 year old female with history of remote colonoscopy in 2010 reportedly by Dr. Samuella CotaPandya (records not available), denying any history of polyps. Recently presenting at prior visit with LLQ abdominal pain, CT overall unrevealing, and improved with bowel regimen. Scant hematochezia in setting of straining likely benign source. She is desirous of a colonoscopy, which is not unreasonable as she is close to 10 years since last evaluation and has had scant episodes of hematochezia.   Proceed with TCS with Dr. Jena Gaussourk in near future: the risks, benefits, and alternatives have been discussed with the patient in detail. The patient states understanding and desires to proceed. Propofol due to history of failed sedation per patient in the past Continue fiber. Continue Linzess 145 prn, which is working well for her

## 2017-10-27 NOTE — Progress Notes (Signed)
CC'ED TO PCP 

## 2017-11-26 NOTE — Patient Instructions (Signed)
Rhonda GalloCarolyn E Murphy  11/26/2017     @PREFPERIOPPHARMACY @   Your procedure is scheduled on  12/02/2017.  Report to [facility] at  1230  P.M.  Call this number if you have problems the morning of surgery:  [phone]   Remember:  Do not eat or drink after midnight.  You may drink clear liquids until (follow the instructions given to you) .  Clear liquids allowed are:                    Water, Juice (non-citric and without pulp), Carbonated beverages, Clear Tea, Black Coffee only, Plain Jell-O only, Gatorade and Plain Popsicles only    Take these medicines the morning of surgery with A SIP OF WATER  Amlodipine, zantac. Use your inhalers before you come.    Do not wear jewelry, make-up or nail polish.  Do not wear lotions, powders, or perfumes, or deodorant.  Do not shave 48 hours prior to surgery.  Men may shave face and neck.  Do not bring valuables to the hospital.  [facility] is not responsible for any belongings or valuables.  Contacts, dentures or bridgework may not be worn into surgery.  Leave your suitcase in the car.  After surgery it may be brought to your room.  For patients admitted to the hospital, discharge time will be determined by your treatment team.  Patients discharged the day of surgery will not be allowed to drive home.   Name and phone number of your driver:   family Special instructions:  Follow the diet and prep instructions given to you by Dr Luvenia Starchourk's office.  Please read over the following fact sheets that you were given. Anesthesia Post-op Instructions and Care and Recovery After Surgery       Colonoscopy, Adult A colonoscopy is an exam to look at the large intestine. It is done to check for problems, such as:  Lumps (tumors).  Growths (polyps).  Swelling (inflammation).  Bleeding.  What happens before the procedure? Eating and drinking Follow instructions from your doctor about eating and drinking. These instructions may  include:  A few days before the procedure - follow a low-fiber diet. ? Avoid nuts. ? Avoid seeds. ? Avoid dried fruit. ? Avoid raw fruits. ? Avoid vegetables.  1-3 days before the procedure - follow a clear liquid diet. Avoid liquids that have red or purple dye. Drink only clear liquids, such as: ? Clear broth or bouillon. ? Black coffee or tea. ? Clear juice. ? Clear soft drinks or sports drinks. ? Gelatin dessert. ? Popsicles.  On the day of the procedure - do not eat or drink anything during the 2 hours before the procedure.  Bowel prep If you were prescribed an oral bowel prep:  Take it as told by your doctor. Starting the day before your procedure, you will need to drink a lot of liquid. The liquid will cause you to poop (have bowel movements) until your poop is almost clear or light green.  If your skin or butt gets irritated from diarrhea, you may: ? Wipe the area with wipes that have medicine in them, such as adult wet wipes with aloe and vitamin E. ? Put something on your skin that soothes the area, such as petroleum jelly.  If you throw up (vomit) while drinking the bowel prep, take a break for up to 60 minutes. Then begin the bowel prep again. If you  keep throwing up and you cannot take the bowel prep without throwing up, call your doctor.  General instructions  Ask your doctor about changing or stopping your normal medicines. This is important if you take diabetes medicines or blood thinners.  Plan to have someone take you home from the hospital or clinic. What happens during the procedure?  An IV tube may be put into one of your veins.  You will be given medicine to help you relax (sedative).  To reduce your risk of infection: ? Your doctors will wash their hands. ? Your anal area will be washed with soap.  You will be asked to lie on your side with your knees bent.  Your doctor will get a long, thin, flexible tube ready. The tube will have a camera and a  light on the end.  The tube will be put into your anus.  The tube will be gently put into your large intestine.  Air will be delivered into your large intestine to keep it open. You may feel some pressure or cramping.  The camera will be used to take photos.  A small tissue sample may be removed from your body to be looked at under a microscope (biopsy). If any possible problems are found, the tissue will be sent to a lab for testing.  If small growths are found, your doctor may remove them and have them checked for cancer.  The tube that was put into your anus will be slowly removed. The procedure may vary among doctors and hospitals. What happens after the procedure?  Your doctor will check on you often until the medicines you were given have worn off.  Do not drive for 24 hours after the procedure.  You may have a small amount of blood in your poop.  You may pass gas.  You may have mild cramps or bloating in your belly (abdomen).  It is up to you to get the results of your procedure. Ask your doctor, or the department performing the procedure, when your results will be ready. This information is not intended to replace advice given to you by your health care provider. Make sure you discuss any questions you have with your health care provider. Document Released: 04/04/2010 Document Revised: 01/01/2016 Document Reviewed: 05/14/2015 Elsevier Interactive Patient Education  2017 [employer]  Colonoscopy, Adult, Care After This sheet gives you information about how to care for yourself after your procedure. Your health care provider may also give you more specific instructions. If you have problems or questions, contact your health care provider. What can I expect after the procedure? After the procedure, it is common to have:  A small amount of blood in your stool for 24 hours after the procedure.  Some gas.  Mild abdominal cramping or bloating.  Follow these  instructions at home: General instructions   For the first 24 hours after the procedure: ? Do not drive or use machinery. ? Do not sign important documents. ? Do not drink alcohol. ? Do your regular daily activities at a slower pace than normal. ? Eat soft, easy-to-digest foods. ? Rest often.  Take over-the-counter or prescription medicines only as told by your health care provider.  It is up to you to get the results of your procedure. Ask your health care provider, or the department performing the procedure, when your results will be ready. Relieving cramping and bloating  Try walking around when you have cramps or feel bloated.  Apply heat to  your abdomen as told by your health care provider. Use a heat source that your health care provider recommends, such as a moist heat pack or a heating pad. ? Place a towel between your skin and the heat source. ? Leave the heat on for 20-30 minutes. ? Remove the heat if your skin turns bright red. This is especially important if you are unable to feel pain, heat, or cold. You may have a greater risk of getting burned. Eating and drinking  Drink enough fluid to keep your urine clear or pale yellow.  Resume your normal diet as instructed by your health care provider. Avoid heavy or fried foods that are hard to digest.  Avoid drinking alcohol for as long as instructed by your health care provider. Contact a health care provider if:  You have blood in your stool 2-3 days after the procedure. Get help right away if:  You have more than a small spotting of blood in your stool.  You pass large blood clots in your stool.  Your abdomen is swollen.  You have nausea or vomiting.  You have a fever.  You have increasing abdominal pain that is not relieved with medicine. This information is not intended to replace advice given to you by your health care provider. Make sure you discuss any questions you have with your health care  provider. Document Released: 10/15/2003 Document Revised: 11/25/2015 Document Reviewed: 05/14/2015 Elsevier Interactive Patient Education  2018 [place] Anesthesia is a term that refers to techniques, procedures, and medicines that help a person stay safe and comfortable during a medical procedure. Monitored anesthesia care, or sedation, is one type of anesthesia. Your anesthesia specialist may recommend sedation if you will be having a procedure that does not require you to be unconscious, such as:  Cataract surgery.  A dental procedure.  A biopsy.  A colonoscopy.  During the procedure, you may receive a medicine to help you relax (sedative). There are three levels of sedation:  Mild sedation. At this level, you may feel awake and relaxed. You will be able to follow directions.  Moderate sedation. At this level, you will be sleepy. You may not remember the procedure.  Deep sedation. At this level, you will be asleep. You will not remember the procedure.  The more medicine you are given, the deeper your level of sedation will be. Depending on how you respond to the procedure, the anesthesia specialist may change your level of sedation or the type of anesthesia to fit your needs. An anesthesia specialist will monitor you closely during the procedure. Let your health care provider know about:  Any allergies you have.  All medicines you are taking, including vitamins, herbs, eye drops, creams, and over-the-counter medicines.  Any use of steroids (by mouth or as a cream).  Any problems you or family members have had with sedatives and anesthetic medicines.  Any blood disorders you have.  Any surgeries you have had.  Any medical conditions you have, such as sleep apnea.  Whether you are pregnant or may be pregnant.  Any use of cigarettes, alcohol, or street drugs. What are the risks? Generally, this is a safe procedure. However, problems may  occur, including:  Getting too much medicine (oversedation).  Nausea.  Allergic reaction to medicines.  Trouble breathing. If this happens, a breathing tube may be used to help with breathing. It will be removed when you are awake and breathing on your own.  Heart trouble.  Lung trouble.  Before the procedure Staying hydrated Follow instructions from your health care provider about hydration, which may include:  Up to 2 hours before the procedure - you may continue to drink clear liquids, such as water, clear fruit juice, black coffee, and plain tea.  Eating and drinking restrictions Follow instructions from your health care provider about eating and drinking, which may include:  8 hours before the procedure - stop eating heavy meals or foods such as meat, fried foods, or fatty foods.  6 hours before the procedure - stop eating light meals or foods, such as toast or cereal.  6 hours before the procedure - stop drinking milk or drinks that contain milk.  2 hours before the procedure - stop drinking clear liquids.  Medicines Ask your health care provider about:  Changing or stopping your regular medicines. This is especially important if you are taking diabetes medicines or blood thinners.  Taking medicines such as aspirin and ibuprofen. These medicines can thin your blood. Do not take these medicines before your procedure if your health care provider instructs you not to.  Tests and exams  You will have a physical exam.  You may have blood tests done to show: ? How well your kidneys and liver are working. ? How well your blood can clot.  General instructions  Plan to have someone take you home from the hospital or clinic.  If you will be going home right after the procedure, plan to have someone with you for 24 hours.  What happens during the procedure?  Your blood pressure, heart rate, breathing, level of pain and overall condition will be monitored.  An IV  tube will be inserted into one of your veins.  Your anesthesia specialist will give you medicines as needed to keep you comfortable during the procedure. This may mean changing the level of sedation.  The procedure will be performed. After the procedure  Your blood pressure, heart rate, breathing rate, and blood oxygen level will be monitored until the medicines you were given have worn off.  Do not drive for 24 hours if you received a sedative.  You may: ? Feel sleepy, clumsy, or nauseous. ? Feel forgetful about what happened after the procedure. ? Have a sore throat if you had a breathing tube during the procedure. ? Vomit. This information is not intended to replace advice given to you by your health care provider. Make sure you discuss any questions you have with your health care provider. Document Released: 11/26/2004 Document Revised: 08/09/2015 Document Reviewed: 06/23/2015 Elsevier Interactive Patient Education  2018 [place], Care After These instructions provide you with information about caring for yourself after your procedure. Your health care provider may also give you more specific instructions. Your treatment has been planned according to current medical practices, but problems sometimes occur. Call your health care provider if you have any problems or questions after your procedure. What can I expect after the procedure? After your procedure, it is common to:  Feel sleepy for several hours.  Feel clumsy and have poor balance for several hours.  Feel forgetful about what happened after the procedure.  Have poor judgment for several hours.  Feel nauseous or vomit.  Have a sore throat if you had a breathing tube during the procedure.  Follow these instructions at home: For at least 24 hours after the procedure:   Do not: ? Participate in activities in which you could fall or become injured. ?  Drive. ? Use heavy  machinery. ? Drink alcohol. ? Take sleeping pills or medicines that cause drowsiness. ? Make important decisions or sign legal documents. ? Take care of children on your own.  Rest. Eating and drinking  Follow the diet that is recommended by your health care provider.  If you vomit, drink water, juice, or soup when you can drink without vomiting.  Make sure you have little or no nausea before eating solid foods. General instructions  Have a responsible adult stay with you until you are awake and alert.  Take over-the-counter and prescription medicines only as told by your health care provider.  If you smoke, do not smoke without supervision.  Keep all follow-up visits as told by your health care provider. This is important. Contact a health care provider if:  You keep feeling nauseous or you keep vomiting.  You feel light-headed.  You develop a rash.  You have a fever. Get help right away if:  You have trouble breathing. This information is not intended to replace advice given to you by your health care provider. Make sure you discuss any questions you have with your health care provider. Document Released: 06/23/2015 Document Revised: 10/23/2015 Document Reviewed: 06/23/2015 Elsevier Interactive Patient Education  [employer].

## 2017-11-29 ENCOUNTER — Telehealth: Payer: Self-pay | Admitting: Internal Medicine

## 2017-11-29 NOTE — Telephone Encounter (Signed)
Spoke with pt. She hasn't started her antibiotic nasal wash and can hold off until after the procedure. Pt wasn't sure if it's ok to proceed with TCS. Pt feels fine just has the allergies.

## 2017-11-29 NOTE — Telephone Encounter (Signed)
Should not be a problem

## 2017-11-29 NOTE — Telephone Encounter (Signed)
Pt is scheduled procedure with [facility]MR on 9/19 and she has a sinus infection and is taking a 30 day antibiotic nasal wash and wanted to know if this would be a problem with her having her procedure. Please call [phone]

## 2017-11-30 NOTE — Telephone Encounter (Signed)
Noted. Pt is aware and said she spoke with her PCP and he thought that it was ok as well.

## 2017-12-01 ENCOUNTER — Encounter (HOSPITAL_COMMUNITY)
Admission: RE | Admit: 2017-12-01 | Discharge: 2017-12-01 | Disposition: A | Discharge status: Home / Self Care | Payer: Medicare Other | Source: Ambulatory Visit | Attending: Internal Medicine | Admitting: Internal Medicine

## 2017-12-01 ENCOUNTER — Other Ambulatory Visit: Payer: Self-pay

## 2017-12-01 ENCOUNTER — Encounter (HOSPITAL_COMMUNITY): Payer: Self-pay

## 2017-12-01 DIAGNOSIS — Z803 Family history of malignant neoplasm of breast: Secondary | ICD-10-CM | POA: Diagnosis not present

## 2017-12-01 DIAGNOSIS — Z79899 Other long term (current) drug therapy: Secondary | ICD-10-CM | POA: Diagnosis not present

## 2017-12-01 DIAGNOSIS — Z87442 Personal history of urinary calculi: Secondary | ICD-10-CM | POA: Diagnosis not present

## 2017-12-01 DIAGNOSIS — Z96651 Presence of right artificial knee joint: Secondary | ICD-10-CM | POA: Diagnosis not present

## 2017-12-01 DIAGNOSIS — Z881 Allergy status to other antibiotic agents status: Secondary | ICD-10-CM | POA: Diagnosis not present

## 2017-12-01 DIAGNOSIS — Z882 Allergy status to sulfonamides status: Secondary | ICD-10-CM | POA: Diagnosis not present

## 2017-12-01 DIAGNOSIS — Z887 Allergy status to serum and vaccine status: Secondary | ICD-10-CM | POA: Diagnosis not present

## 2017-12-01 DIAGNOSIS — I447 Left bundle-branch block, unspecified: Secondary | ICD-10-CM | POA: Diagnosis not present

## 2017-12-01 DIAGNOSIS — Z9049 Acquired absence of other specified parts of digestive tract: Secondary | ICD-10-CM | POA: Diagnosis not present

## 2017-12-01 DIAGNOSIS — I341 Nonrheumatic mitral (valve) prolapse: Secondary | ICD-10-CM | POA: Diagnosis not present

## 2017-12-01 DIAGNOSIS — Z1211 Encounter for screening for malignant neoplasm of colon: Secondary | ICD-10-CM | POA: Diagnosis present

## 2017-12-01 DIAGNOSIS — Z8619 Personal history of other infectious and parasitic diseases: Secondary | ICD-10-CM | POA: Diagnosis not present

## 2017-12-01 DIAGNOSIS — Z888 Allergy status to other drugs, medicaments and biological substances status: Secondary | ICD-10-CM | POA: Diagnosis not present

## 2017-12-01 DIAGNOSIS — K5730 Diverticulosis of large intestine without perforation or abscess without bleeding: Secondary | ICD-10-CM | POA: Diagnosis not present

## 2017-12-01 DIAGNOSIS — K219 Gastro-esophageal reflux disease without esophagitis: Secondary | ICD-10-CM | POA: Diagnosis not present

## 2017-12-01 DIAGNOSIS — D649 Anemia, unspecified: Secondary | ICD-10-CM | POA: Diagnosis not present

## 2017-12-01 DIAGNOSIS — Z90710 Acquired absence of both cervix and uterus: Secondary | ICD-10-CM | POA: Diagnosis not present

## 2017-12-01 DIAGNOSIS — M1990 Unspecified osteoarthritis, unspecified site: Secondary | ICD-10-CM | POA: Diagnosis not present

## 2017-12-01 DIAGNOSIS — J45909 Unspecified asthma, uncomplicated: Secondary | ICD-10-CM | POA: Diagnosis not present

## 2017-12-01 DIAGNOSIS — I10 Essential (primary) hypertension: Secondary | ICD-10-CM | POA: Diagnosis not present

## 2017-12-01 HISTORY — DX: Personal history of urinary calculi: Z87.442

## 2017-12-02 ENCOUNTER — Other Ambulatory Visit (HOSPITAL_COMMUNITY): Payer: Self-pay

## 2017-12-02 ENCOUNTER — Ambulatory Visit (HOSPITAL_COMMUNITY): Payer: Medicare Other | Admitting: Anesthesiology

## 2017-12-02 ENCOUNTER — Encounter (HOSPITAL_COMMUNITY)
Admission: RE | Disposition: A | Discharge status: Home / Self Care | Payer: Self-pay | Source: Ambulatory Visit | Attending: Internal Medicine

## 2017-12-02 ENCOUNTER — Encounter (HOSPITAL_COMMUNITY): Payer: Self-pay | Admitting: *Deleted

## 2017-12-02 ENCOUNTER — Ambulatory Visit (HOSPITAL_COMMUNITY)
Admission: RE | Admit: 2017-12-02 | Discharge: 2017-12-02 | Disposition: A | Discharge status: Home / Self Care | Payer: Medicare Other | Source: Ambulatory Visit | Attending: Internal Medicine | Admitting: Internal Medicine

## 2017-12-02 DIAGNOSIS — J45909 Unspecified asthma, uncomplicated: Secondary | ICD-10-CM | POA: Insufficient documentation

## 2017-12-02 DIAGNOSIS — Z887 Allergy status to serum and vaccine status: Secondary | ICD-10-CM | POA: Insufficient documentation

## 2017-12-02 DIAGNOSIS — K5730 Diverticulosis of large intestine without perforation or abscess without bleeding: Secondary | ICD-10-CM | POA: Insufficient documentation

## 2017-12-02 DIAGNOSIS — D649 Anemia, unspecified: Secondary | ICD-10-CM | POA: Insufficient documentation

## 2017-12-02 DIAGNOSIS — Z8619 Personal history of other infectious and parasitic diseases: Secondary | ICD-10-CM | POA: Insufficient documentation

## 2017-12-02 DIAGNOSIS — M1990 Unspecified osteoarthritis, unspecified site: Secondary | ICD-10-CM | POA: Diagnosis not present

## 2017-12-02 DIAGNOSIS — Z803 Family history of malignant neoplasm of breast: Secondary | ICD-10-CM | POA: Insufficient documentation

## 2017-12-02 DIAGNOSIS — K219 Gastro-esophageal reflux disease without esophagitis: Secondary | ICD-10-CM | POA: Insufficient documentation

## 2017-12-02 DIAGNOSIS — Z888 Allergy status to other drugs, medicaments and biological substances status: Secondary | ICD-10-CM | POA: Insufficient documentation

## 2017-12-02 DIAGNOSIS — I341 Nonrheumatic mitral (valve) prolapse: Secondary | ICD-10-CM | POA: Insufficient documentation

## 2017-12-02 DIAGNOSIS — Z9049 Acquired absence of other specified parts of digestive tract: Secondary | ICD-10-CM | POA: Insufficient documentation

## 2017-12-02 DIAGNOSIS — I10 Essential (primary) hypertension: Secondary | ICD-10-CM | POA: Insufficient documentation

## 2017-12-02 DIAGNOSIS — Z882 Allergy status to sulfonamides status: Secondary | ICD-10-CM | POA: Insufficient documentation

## 2017-12-02 DIAGNOSIS — Z1211 Encounter for screening for malignant neoplasm of colon: Secondary | ICD-10-CM | POA: Insufficient documentation

## 2017-12-02 DIAGNOSIS — Z881 Allergy status to other antibiotic agents status: Secondary | ICD-10-CM | POA: Insufficient documentation

## 2017-12-02 DIAGNOSIS — Z79899 Other long term (current) drug therapy: Secondary | ICD-10-CM | POA: Insufficient documentation

## 2017-12-02 DIAGNOSIS — Z87442 Personal history of urinary calculi: Secondary | ICD-10-CM | POA: Insufficient documentation

## 2017-12-02 DIAGNOSIS — Z96651 Presence of right artificial knee joint: Secondary | ICD-10-CM | POA: Insufficient documentation

## 2017-12-02 DIAGNOSIS — Z90710 Acquired absence of both cervix and uterus: Secondary | ICD-10-CM | POA: Insufficient documentation

## 2017-12-02 DIAGNOSIS — I447 Left bundle-branch block, unspecified: Secondary | ICD-10-CM | POA: Insufficient documentation

## 2017-12-02 HISTORY — PX: COLONOSCOPY WITH PROPOFOL: SHX5780

## 2017-12-02 SURGERY — COLONOSCOPY WITH PROPOFOL
Anesthesia: General

## 2017-12-02 MED ORDER — CHLORHEXIDINE GLUCONATE CLOTH 2 % EX PADS: 6.0000 | MEDICATED_PAD | Freq: Once | CUTANEOUS | Status: DC

## 2017-12-02 MED ORDER — STERILE WATER FOR IRRIGATION IR SOLN
Status: DC | PRN
  Administered 2017-12-02: 1.5 mL

## 2017-12-02 MED ORDER — PROPOFOL 500 MG/50ML IV EMUL
INTRAVENOUS | Status: DC | PRN
  Administered 2017-12-02: 15:00:00 via INTRAVENOUS
  Administered 2017-12-02: 150 ug/kg/min via INTRAVENOUS

## 2017-12-02 MED ORDER — LACTATED RINGERS IV SOLN
INTRAVENOUS | Status: DC | PRN
  Administered 2017-12-02 (×2): via INTRAVENOUS

## 2017-12-02 MED ORDER — LACTATED RINGERS IV SOLN: INTRAVENOUS | Status: DC

## 2017-12-02 MED ORDER — PROPOFOL 10 MG/ML IV BOLUS
INTRAVENOUS | Status: AC
  Filled 2017-12-02: qty 20

## 2017-12-02 NOTE — Op Note (Addendum)
[facility] Patient Name: Rhonda Murphy Procedure Date: 12/02/2017 2:20 PM MRN: [id number] Date of Birth: [birth year] Attending MD: Gennette Pac , MD CSN: [id number] Age: 72 Admit Type: Outpatient Procedure:                Colonoscopy Indications:              Screening for colorectal malignant neoplasm Providers:                Gennette Pac, MD, Buel Ream. Thomasena Edis RN, RN,                            Edythe Clarity, Technician Referring MD:              Medicines:                Monitored Anesthesia Care Complications:            No immediate complications. Estimated Blood Loss:     Estimated blood loss: none. Procedure:                Pre-Anesthesia Assessment:                           - Prior to the procedure, a History and Physical                            was performed, and patient medications and                            allergies were reviewed. The patient's tolerance of                            previous anesthesia was also reviewed. The risks                            and benefits of the procedure and the sedation                            options and risks were discussed with the patient.                            All questions were answered, and informed consent                            was obtained. Prior Anticoagulants: The patient has                            taken no previous anticoagulant or antiplatelet                            agents. ASA Grade Assessment: II - A patient with                            mild systemic disease. After reviewing the risks  and benefits, the patient was deemed in                            satisfactory condition to undergo the procedure.                           After obtaining informed consent, the colonoscope                            was passed under direct vision. Throughout the                            procedure, the patient's blood pressure, pulse, and     oxygen saturations were monitored continuously. The                            [id number]-[id number] ([id number]) scope was introduced through                            the and advanced to the the cecum, identified by                            appendiceal orifice and ileocecal valve. The                            colonoscopy was performed without difficulty. The                            patient tolerated the procedure well. The quality                            of the bowel preparation was adequate. Scope In: 2:35:59 PM Scope Out: 2:57:39 PM Scope Withdrawal Time: 0 hours 7 minutes 32 seconds  Total Procedure Duration: 0 hours 21 minutes 40 seconds  Findings:      The perianal and digital rectal examinations were normal.      Scattered small and large-mouthed diverticula were found in the entire       colon. Tortuous and redundant colon.      The exam was otherwise without abnormality on direct and retroflexion       views. Impression:               - Diverticulosis in the entire examined colon.                           - The examination was otherwise normal on direct                            and retroflexion views.                           - No specimens collected. Moderate Sedation:      Moderate (conscious) sedation was personally administered by an       anesthesia professional. The following parameters were monitored: oxygen       saturation, heart rate, blood pressure, respiratory rate,  EKG, adequacy       of pulmonary ventilation, and response to care. Total physician       intraservice time was 29 minutes. Recommendation:           - The signs and symptoms of potential delayed                            complications were discussed with the patient.                           - Patient has a contact number available for                            emergencies.                           - Return to normal activities tomorrow.                           - Advance diet as tolerated.                            - Continue present medications.                           - No future colonoscopy recommended unless new                            symptoms develop. Continue daily probiotic and                            fiber supplementation..                           - Return to GI office PRN. Procedure Code(s):        --- Professional ---                           [phone], Colonoscopy, flexible; diagnostic, including                            collection of specimen(s) by brushing or washing,                            when performed (separate procedure) Diagnosis Code(s):        --- Professional ---                           Z12.11, Encounter for screening for malignant                            neoplasm of colon                           K57.30, Diverticulosis of large intestine without  perforation or abscess without bleeding CPT copyright 2018 American Medical Association. All rights reserved. The codes documented in this report are preliminary and upon coder review may  be revised to meet current compliance requirements. Gerrit Friends. Mckinzey Entwistle, MD Gennette Pac, MD 12/02/2017 3:02:38 PM This report has been signed electronically. Number of Addenda: 0

## 2017-12-02 NOTE — Anesthesia Postprocedure Evaluation (Signed)
Anesthesia Post Note  Patient: Rhonda Murphy  Procedure(s) Performed: COLONOSCOPY WITH PROPOFOL (N/A )  Patient location during evaluation: PACU Anesthesia Type: MAC Level of consciousness: awake and alert and patient cooperative Pain management: pain level controlled Vital Signs Assessment: post-procedure vital signs reviewed and stable Respiratory status: spontaneous breathing, nonlabored ventilation and respiratory function stable Cardiovascular status: blood pressure returned to baseline Postop Assessment: no apparent nausea or vomiting Anesthetic complications: no     Last Vitals:  Vitals:   12/02/17 1306 12/02/17 1505  BP: (!) 166/74 (!) 159/67  Pulse:  92  Resp:  18  Temp: 36.6 C (P) 36.8 C  SpO2: 99% 100%    Last Pain:  Vitals:   12/02/17 1330  TempSrc:   PainSc: 0-No pain                 Lee Kalt J

## 2017-12-02 NOTE — Anesthesia Preprocedure Evaluation (Signed)
Anesthesia Evaluation  Patient identified by MRN, date of birth, ID band Patient awake    Reviewed: Allergy & Precautions, H&P , NPO status , Patient's Chart, lab work & pertinent test results  Airway Mallampati: I  TM Distance: >3 FB Neck ROM: full    Dental no notable dental hx.    Pulmonary neg pulmonary ROS, asthma ,    Pulmonary exam normal breath sounds clear to auscultation       Cardiovascular Exercise Tolerance: Good hypertension, negative cardio ROS  + dysrhythmias  Rhythm:regular Rate:Normal     Neuro/Psych negative neurological ROS  negative psych ROS   GI/Hepatic negative GI ROS, Neg liver ROS, GERD  ,  Endo/Other  negative endocrine ROS  Renal/GU negative Renal ROS  negative genitourinary   Musculoskeletal   Abdominal   Peds  Hematology negative hematology ROS (+) anemia ,   Anesthesia Other Findings   Reproductive/Obstetrics negative OB ROS                             Anesthesia Physical Anesthesia Plan  ASA: III  Anesthesia Plan: General   Post-op Pain Management:    Induction:   PONV Risk Score and Plan:   Airway Management Planned:   Additional Equipment:   Intra-op Plan:   Post-operative Plan:   Informed Consent: I have reviewed the patients History and Physical, chart, labs and discussed the procedure including the risks, benefits and alternatives for the proposed anesthesia with the patient or authorized representative who has indicated his/her understanding and acceptance.     Plan Discussed with: CRNA  Anesthesia Plan Comments:         Anesthesia Quick Evaluation

## 2017-12-02 NOTE — H&P (Signed)
'@LOGO' @   Primary Care Physician:  Earney Mallet, MD Primary Gastroenterologist:  Dr. Gala Romney  Pre-Procedure History & Physical: HPI:  Rhonda Murphy is a 72 y.o. female is here for a screening colonoscopy.   Past Medical History:  Diagnosis Date  . Acid reflux   . Arthritis   . Asthma   . Asthma   . Back pain   . C. difficile colitis 2011  . Enteritis 03/21/2013  . History of kidney stones   . Hypertension   . Influenza A 03/24/2013  . LBBB (left bundle branch block) 03/21/2013  . Mitral valve prolapse   . Shingles   . Small bowel obstruction ([place]) 03/24/2013   SBO vs. enteritis.     Past Surgical History:  Procedure Laterality Date  . ABDOMINAL HYSTERECTOMY    . APPENDECTOMY    . CATARACT EXTRACTION Bilateral   . CHOLECYSTECTOMY    . COLONOSCOPY  2010   Dr. Earley Brooke  . ESOPHAGOGASTRODUODENOSCOPY  2011   Dr. Gala Romney: patent Schatzki's ring s/p dilation, esophageal biopsies negative, duodenal biopsies negative. Intolerant to PPIs in past and took H2 blockers  . EXPLORATORY LAPAROTOMY     in her 62s, removed appendix, hysterectomy, cholecystectomy, varicose veins in abdomen  . JOINT REPLACEMENT     Rt knee  . RENAL ARTERY STENT  2019   [place]  . TONSILLECTOMY    . 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 TOTAL KNEE REVISION Left 04/16/2015   Procedure: TOTAL KNEE REVISION;  Surgeon: Hessie Knows, MD;  Location: [facility];  Service: Orthopedics;  Laterality: Left;    Prior to Admission medications   Medication Sig Start Date End Date Taking? Authorizing Provider  acetaminophen (TYLENOL) 650 MG CR tablet Take 650 mg by mouth 2 (two) times daily as needed for pain.    Yes [provider]  amitriptyline (ELAVIL) 25 MG tablet Take 25 mg by mouth at bedtime.   Yes [provider]  amLODipine (NORVASC) 10 MG tablet Take 10 mg by  mouth daily.   Yes [provider]  docusate sodium (COLACE) 100 MG capsule Take 100-200 mg by mouth See admin instructions. Take 1 capsule (100 mg) by mouth in the morning & 2 capsules (200 mg) by mouth in the evening.   Yes [provider]  fluticasone furoate-vilanterol (BREO ELLIPTA) 100-25 MCG/INH AEPB Inhale 1 puff into the lungs daily.   Yes [provider]  hydrochlorothiazide (HYDRODIURIL) 25 MG tablet Take 25 mg by mouth daily.    Yes [provider]  levocetirizine (XYZAL) 5 MG tablet Take 5 mg by mouth every evening.   Yes [provider]  linaclotide (LINZESS) 145 MCG CAPS capsule Take 145 mcg by mouth daily as needed (for constipation.).   Yes [provider]  losartan (COZAAR) 100 MG tablet Take 100 mg by mouth daily.   Yes [provider]  montelukast (SINGULAIR) 10 MG tablet Take 10 mg by mouth at bedtime.    Yes [provider]  Multiple Vitamin (MULTIVITAMIN WITH MINERALS) TABS tablet Take 1 tablet by mouth daily. Centrum silver   Yes [provider]  NON FORMULARY Inject 1 Dose as directed 2 (two) times a week. Allergy shot   Yes [provider]  ranitidine (ZANTAC) 150 MG tablet Take 150 mg by mouth 2 (two) times daily. 09/07/17  Yes [provider]  saccharomyces boulardii (FLORASTOR) 250 MG capsule Take 250 mg by mouth 2 (two) times daily.   Yes [provider]  simvastatin (ZOCOR) 20 MG tablet Take 20 mg by mouth every evening. 11/19/17  Yes [provider]  EPINEPHrine 0.3 mg/0.3 mL IJ SOAJ injection Inject 0.3 mg into the muscle daily as needed for anaphylaxis. 10/19/17   [provider]  Na Sulfate-K Sulfate-Mg Sulf 17.5-3.13-1.6 GM/177ML SOLN Take 1 kit by mouth as directed. 10/22/17   Daneil Dolin, MD    Allergies as of 10/22/2017 - Review Complete 10/22/2017  Allergen Reaction Noted  . Influenza vaccines Rash 03/21/2013  . Sulfa antibiotics  Anaphylaxis 03/21/2013  . Esomeprazole magnesium    . Iodine  10/25/2014  . Lipitor [atorvastatin] Other (See Comments) 07/13/2014  . Norvasc [amlodipine besylate]  10/25/2014  . Omeprazole    . Other Other (See Comments) 07/13/2014  . Pantoprazole sodium    . Prevacid [lansoprazole] Other (See Comments) 07/13/2014  . Rabeprazole sodium    . Spironolactone Other (See Comments) 07/13/2014  . Sulfonamide derivatives    . Terazosin Other (See Comments) 07/13/2014  . Tigan [trimethobenzamide hcl] Other (See Comments) 07/13/2014  . Zithromax [azithromycin] Other (See Comments) 07/13/2014    Family History  Problem Relation Age of Onset  . Breast cancer Mother 62  . Colon cancer Neg Hx   . Colon polyps Neg Hx     Social History   Socioeconomic History  . Marital status: Married    Spouse name: Not on file  . Number of children: Not on file  . Years of education: Not on file  . Highest education level: Not on file  Occupational History  . Not on file  Social Needs  . Financial resource strain: Not on file  . Food insecurity:    Worry: Not on file    Inability: Not on file  . Transportation needs:    Medical: Not on file    Non-medical: Not on file  Tobacco Use  . Smoking status: Never Smoker  . Smokeless tobacco: Never Used  Substance and Sexual Activity  . Alcohol use: Yes    Comment: rare wine  . Drug use: No  . Sexual activity: Yes    Birth control/protection: Surgical  Lifestyle  . Physical activity:    Days per week: Not on file    Minutes per session: Not on file  . Stress: Not on file  Relationships  . Social connections:    Talks on phone: Not on file    Gets together: Not on file    Attends religious service: Not on file    Active member of club or organization: Not on file    Attends meetings of clubs or organizations: Not on file    Relationship status: Not on file  . Intimate partner violence:    Fear of current or ex partner: Not on file     Emotionally abused: Not on file    Physically abused: Not on file    Forced sexual activity: Not on file  Other Topics Concern  . Not on file  Social History Narrative  . Not on file    Review of Systems: See HPI, otherwise negative ROS  Physical Exam: BP (!) 166/74   Temp 97.9 F (36.6 C) (Oral)   SpO2 99%  General:   Alert,  Well-developed, well-nourished, pleasant and cooperative in NAD Lungs:  Clear throughout to auscultation.   No wheezes, crackles, or  rhonchi. No acute distress. Heart:  Regular rate and rhythm; no murmurs, clicks, rubs,  or gallops. Abdomen:  Soft, nontender and nondistended. No masses, hepatosplenomegaly or hernias noted. Normal bowel sounds, without guarding, and without rebound.    Impression/Plan: Rhonda Murphy is now here to undergo a screening colonoscopy.  Risks, benefits, limitations, imponderables and alternatives regarding colonoscopy have been reviewed with the patient. Questions have been answered. All parties agreeable.     Notice:  This dictation was prepared with Dragon dictation along with smaller phrase technology. Any transcriptional errors that result from this process are unintentional and may not be corrected upon review.

## 2017-12-02 NOTE — Discharge Instructions (Signed)
Colonoscopy Discharge Instructions  Read the instructions outlined below and refer to this sheet in the next few weeks. These discharge instructions provide you with general information on caring for yourself after you leave the hospital. Your doctor may also give you specific instructions. While your treatment has been planned according to the most current medical practices available, unavoidable complications occasionally occur. If you have any problems or questions after discharge, call Dr. Jena Gaussourk at [phone]. ACTIVITY  You may resume your regular activity, but move at a slower pace for the next 24 hours.   Take frequent rest periods for the next 24 hours.   Walking will help get rid of the air and reduce the bloated feeling in your belly (abdomen).   No driving for 24 hours (because of the medicine (anesthesia) used during the test).    Do not sign any important legal documents or operate any machinery for 24 hours (because of the anesthesia used during the test).  NUTRITION  Drink plenty of fluids.   You may resume your normal diet as instructed by your doctor.   Begin with a light meal and progress to your normal diet. Heavy or fried foods are harder to digest and may make you feel sick to your stomach (nauseated).   Avoid alcoholic beverages for 24 hours or as instructed.  MEDICATIONS  You may resume your normal medications unless your doctor tells you otherwise.  WHAT YOU CAN EXPECT TODAY  Some feelings of bloating in the abdomen.   Passage of more gas than usual.   Spotting of blood in your stool or on the toilet paper.  IF YOU HAD POLYPS REMOVED DURING THE COLONOSCOPY:  No aspirin products for 7 days or as instructed.   No alcohol for 7 days or as instructed.   Eat a soft diet for the next 24 hours.  FINDING OUT THE RESULTS OF YOUR TEST Not all test results are available during your visit. If your test results are not back during the visit, make an appointment  with your caregiver to find out the results. Do not assume everything is normal if you have not heard from your caregiver or the medical facility. It is important for you to follow up on all of your test results.  SEEK IMMEDIATE MEDICAL ATTENTION IF:  You have more than a spotting of blood in your stool.   Your belly is swollen (abdominal distention).   You are nauseated or vomiting.   You have a temperature over 101.   You have abdominal pain or discomfort that is severe or gets worse throughout the day.    Diverticulosis information provided  Continue probiotic and fiber supplementation  I do not recommend a future colonoscopy unless new symptoms develop.    Diverticulosis Diverticulosis is a condition that develops when small pouches (diverticula) form in the wall of the large intestine (colon). The colon is where water is absorbed and stool is formed. The pouches form when the inside layer of the colon pushes through weak spots in the outer layers of the colon. You may have a few pouches or many of them. What are the causes? The cause of this condition is not known. What increases the risk? The following factors may make you more likely to develop this condition:  Being older than age 74. Your risk for this condition increases with age. Diverticulosis is rare among people younger than age 74. By age 74, many people have it.  Eating a low-fiber diet.  Having frequent constipation.  Being overweight.  Not getting enough exercise.  Smoking.  Taking over-the-counter pain medicines, like aspirin and ibuprofen.  Having a family history of diverticulosis.  What are the signs or symptoms? In most people, there are no symptoms of this condition. If you do have symptoms, they may include:  Bloating.  Cramps in the abdomen.  Constipation or diarrhea.  Pain in the lower left side of the abdomen.  How is this diagnosed? This condition is most often diagnosed during an  exam for other colon problems. Because diverticulosis usually has no symptoms, it often cannot be diagnosed independently. This condition may be diagnosed by:  Using a flexible scope to examine the colon (colonoscopy).  Taking an X-ray of the colon after dye has been put into the colon (barium enema).  Doing a CT scan.  How is this treated? You may not need treatment for this condition if you have never developed an infection related to diverticulosis. If you have had an infection before, treatment may include:  Eating a high-fiber diet. This may include eating more fruits, vegetables, and grains.  Taking a fiber supplement.  Taking a live bacteria supplement (probiotic).  Taking medicine to relax your colon.  Taking antibiotic medicines.  Follow these instructions at home:  Drink 6-8 glasses of water or more each day to prevent constipation.  Try not to strain when you have a bowel movement.  If you have had an infection before: ? Eat more fiber as directed by your health care provider or your diet and nutrition specialist (dietitian). ? Take a fiber supplement or probiotic, if your health care provider approves.  Take over-the-counter and prescription medicines only as told by your health care provider.  If you were prescribed an antibiotic, take it as told by your health care provider. Do not stop taking the antibiotic even if you start to feel better.  Keep all follow-up visits as told by your health care provider. This is important. Contact a health care provider if:  You have pain in your abdomen.  You have bloating.  You have cramps.  You have not had a bowel movement in 3 days. Get help right away if:  Your pain gets worse.  Your bloating becomes very bad.  You have a fever or chills, and your symptoms suddenly get worse.  You vomit.  You have bowel movements that are bloody or black.  You have bleeding from your rectum. Summary  Diverticulosis is a  condition that develops when small pouches (diverticula) form in the wall of the large intestine (colon).  You may have a few pouches or many of them.  This condition is most often diagnosed during an exam for other colon problems.  If you have had an infection related to diverticulosis, treatment may include increasing the fiber in your diet, taking supplements, or taking medicines. This information is not intended to replace advice given to you by your health care provider. Make sure you discuss any questions you have with your health care provider. Document Released: 11/28/2003 Document Revised: 01/20/2016 Document Reviewed: 01/20/2016 Elsevier Interactive Patient Education  2017 [employer]    Monitored Anesthesia Care, Care After These instructions provide you with information about caring for yourself after your procedure. Your health care provider may also give you more specific instructions. Your treatment has been planned according to current medical practices, but problems sometimes occur. Call your health care provider if you have any problems or questions after your procedure.  What can I expect after the procedure? After your procedure, it is common to:  Feel sleepy for several hours.  Feel clumsy and have poor balance for several hours.  Feel forgetful about what happened after the procedure.  Have poor judgment for several hours.  Feel nauseous or vomit.  Have a sore throat if you had a breathing tube during the procedure.  Follow these instructions at home: For at least 24 hours after the procedure:   Do not: ? Participate in activities in which you could fall or become injured. ? Drive. ? Use heavy machinery. ? Drink alcohol. ? Take sleeping pills or medicines that cause drowsiness. ? Make important decisions or sign legal documents. ? Take care of children on your own.  Rest. Eating and drinking  Follow the diet that is recommended by your health care  provider.  If you vomit, drink water, juice, or soup when you can drink without vomiting.  Make sure you have little or no nausea before eating solid foods. General instructions  Have a responsible adult stay with you until you are awake and alert.  Take over-the-counter and prescription medicines only as told by your health care provider.  If you smoke, do not smoke without supervision.  Keep all follow-up visits as told by your health care provider. This is important. Contact a health care provider if:  You keep feeling nauseous or you keep vomiting.  You feel light-headed.  You develop a rash.  You have a fever. Get help right away if:  You have trouble breathing. This information is not intended to replace advice given to you by your health care provider. Make sure you discuss any questions you have with your health care provider. Document Released: 06/23/2015 Document Revised: 10/23/2015 Document Reviewed: 06/23/2015 Elsevier Interactive Patient Education  [employer].

## 2017-12-02 NOTE — Transfer of Care (Signed)
Immediate Anesthesia Transfer of Care Note  Patient: Rhonda Murphy  Procedure(s) Performed: COLONOSCOPY WITH PROPOFOL (N/A )  Patient Location: PACU  Anesthesia Type:MAC  Level of Consciousness: awake and patient cooperative  Airway & Oxygen Therapy: Patient Spontanous Breathing  Post-op Assessment: Report given to RN, Post -op Vital signs reviewed and stable and Patient moving all extremities  Post vital signs: Reviewed and stable  Last Vitals:  Vitals Value Taken Time  BP    Temp    Pulse 92 12/02/2017  3:04 PM  Resp 18 12/02/2017  3:04 PM  SpO2 100 % 12/02/2017  3:04 PM  Vitals shown include unvalidated device data.  Last Pain:  Vitals:   12/02/17 1330  TempSrc:   PainSc: 0-No pain      Patients Stated Pain Goal: 8 (26/41/58 3094)  Complications: No apparent anesthesia complications

## 2017-12-07 ENCOUNTER — Encounter (HOSPITAL_COMMUNITY): Payer: Self-pay | Admitting: Internal Medicine

## 2018-05-12 ENCOUNTER — Other Ambulatory Visit: Payer: Self-pay

## 2018-05-12 ENCOUNTER — Encounter (HOSPITAL_COMMUNITY): Payer: Self-pay | Admitting: Emergency Medicine

## 2018-05-12 ENCOUNTER — Emergency Department (HOSPITAL_COMMUNITY)
Admission: EM | Admit: 2018-05-12 | Discharge: 2018-05-12 | Disposition: A | Discharge status: Home / Self Care | Payer: Medicare Other | Attending: Emergency Medicine | Admitting: Emergency Medicine

## 2018-05-12 ENCOUNTER — Emergency Department (HOSPITAL_COMMUNITY): Payer: Medicare Other

## 2018-05-12 DIAGNOSIS — R05 Cough: Secondary | ICD-10-CM | POA: Insufficient documentation

## 2018-05-12 DIAGNOSIS — Z96653 Presence of artificial knee joint, bilateral: Secondary | ICD-10-CM | POA: Diagnosis not present

## 2018-05-12 DIAGNOSIS — R059 Cough, unspecified: Secondary | ICD-10-CM

## 2018-05-12 DIAGNOSIS — J45909 Unspecified asthma, uncomplicated: Secondary | ICD-10-CM | POA: Insufficient documentation

## 2018-05-12 DIAGNOSIS — I10 Essential (primary) hypertension: Secondary | ICD-10-CM | POA: Insufficient documentation

## 2018-05-12 DIAGNOSIS — Z79899 Other long term (current) drug therapy: Secondary | ICD-10-CM | POA: Insufficient documentation

## 2018-05-12 MED ORDER — PREDNISONE 20 MG PO TABS: ORAL_TABLET | ORAL | 0 refills | Status: DC

## 2018-05-12 MED ORDER — PREDNISONE 50 MG PO TABS
60.0000 mg | ORAL_TABLET | Freq: Once | ORAL | Status: AC
  Administered 2018-05-12: 60 mg via ORAL
  Filled 2018-05-12: qty 1

## 2018-05-12 MED ORDER — ALBUTEROL SULFATE (2.5 MG/3ML) 0.083% IN NEBU
2.5000 mg | INHALATION_SOLUTION | Freq: Once | RESPIRATORY_TRACT | Status: AC
  Administered 2018-05-12: 2.5 mg via RESPIRATORY_TRACT
  Filled 2018-05-12: qty 3

## 2018-05-12 MED ORDER — ALBUTEROL SULFATE HFA 108 (90 BASE) MCG/ACT IN AERS
2.0000 | INHALATION_SPRAY | Freq: Four times a day (QID) | RESPIRATORY_TRACT | Status: DC | PRN
  Filled 2018-05-12 (×2): qty 6.7

## 2018-05-12 MED ORDER — IPRATROPIUM-ALBUTEROL 0.5-2.5 (3) MG/3ML IN SOLN
3.0000 mL | Freq: Once | RESPIRATORY_TRACT | Status: AC
  Administered 2018-05-12: 3 mL via RESPIRATORY_TRACT
  Filled 2018-05-12: qty 3

## 2018-05-12 NOTE — Discharge Instructions (Addendum)
Use the inhaler 4 times a day if needed and follow-up with your doctor as planned this week

## 2018-05-12 NOTE — ED Provider Notes (Signed)
[facility] EMERGENCY DEPARTMENT Provider Note   CSN: [id number] Arrival date & time: 05/12/18  1839    History   Chief Complaint Chief Complaint  Patient presents with  . Cough    HPI Rhonda Murphy is a 73 y.o. female.     Patient complains of cough and is on antibiotics for sinus infection.  She has a cough for 2 weeks  The history is provided by the patient. No language interpreter was used.  Cough  Cough characteristics:  Productive Sputum characteristics:  Nondescript Severity:  Moderate Onset quality:  Gradual Timing:  Constant Progression:  Worsening Chronicity:  New Associated symptoms: no chest pain, no eye discharge, no headaches and no rash     Past Medical History:  Diagnosis Date  . Acid reflux   . Arthritis   . Asthma   . Asthma   . Back pain   . C. difficile colitis 2011  . Enteritis 03/21/2013  . History of kidney stones   . Hypertension   . Influenza A 03/24/2013  . LBBB (left bundle branch block) 03/21/2013  . Mitral valve prolapse   . Shingles   . Small bowel obstruction ([place]) 03/24/2013   SBO vs. enteritis.     Patient Active Problem List   Diagnosis Date Noted  . Encounter for screening colonoscopy 10/22/2017  . 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 Primary osteoarthritis of knee 11/08/2014  . Primary localized osteoarthrosis of the knee 07/14/2014  . Leukopenia 03/25/2013  . Influenza A 03/24/2013  . Small bowel obstruction ([place]) 03/24/2013  . Fever, unspecified 03/23/2013  . Enteritis 03/21/2013  . Vomiting 03/21/2013  . LBBB (left bundle branch block) 03/21/2013  . GERD 02/11/2010  . OTHER DYSPHAGIA 02/11/2010  . ANEMIA 01/07/2010  . DIARRHEA 01/07/2010  . Abdominal pain 01/07/2010  . CLOSTRIDIUM DIFFICILE COLITIS, HX OF 01/07/2010    Past Surgical History:  Procedure Laterality Date  . ABDOMINAL HYSTERECTOMY    . APPENDECTOMY    . CATARACT EXTRACTION Bilateral   . CHOLECYSTECTOMY    . COLONOSCOPY  2010   Dr.  Earley Brooke  . COLONOSCOPY WITH PROPOFOL N/A 12/02/2017   Procedure: COLONOSCOPY WITH PROPOFOL;  Surgeon: Daneil Dolin, MD;  Location: [facility]ITE;  Service: Endoscopy;  Laterality: N/A;  9:15am  . ESOPHAGOGASTRODUODENOSCOPY  2011   Dr. Gala Romney: patent Schatzki's ring s/p dilation, esophageal biopsies negative, duodenal biopsies negative. Intolerant to PPIs in past and took H2 blockers  . EXPLORATORY LAPAROTOMY     in her 69s, removed appendix, hysterectomy, cholecystectomy, varicose veins in abdomen  . JOINT REPLACEMENT     Rt knee  . RENAL ARTERY STENT  2019   [place]  . TONSILLECTOMY    . 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 TOTAL KNEE REVISION Left 04/16/2015   Procedure: TOTAL KNEE REVISION;  Surgeon: Hessie Knows, MD;  Location: [facility] ORS;  Service: Orthopedics;  Laterality: Left;     OB History   No obstetric history on file.      Home Medications    Prior to Admission medications   Medication Sig Start Date End Date Taking? Authorizing Provider  acetaminophen (TYLENOL) 650 MG CR tablet Take 650 mg by mouth 2 (two) times daily as needed for pain.     [provider]  amitriptyline (ELAVIL) 25 MG tablet Take 25 mg by mouth  at bedtime.    [provider]  amLODipine (NORVASC) 10 MG tablet Take 10 mg by mouth daily.    [provider]  docusate sodium (COLACE) 100 MG capsule Take 100-200 mg by mouth See admin instructions. Take 1 capsule (100 mg) by mouth in the morning & 2 capsules (200 mg) by mouth in the evening.    [provider]  EPINEPHrine 0.3 mg/0.3 mL IJ SOAJ injection Inject 0.3 mg into the muscle daily as needed for anaphylaxis. 10/19/17   [provider]  fluticasone furoate-vilanterol (BREO ELLIPTA) 100-25 MCG/INH AEPB Inhale 1 puff into the lungs daily.    [provider]    hydrochlorothiazide (HYDRODIURIL) 25 MG tablet Take 25 mg by mouth daily.     [provider]  levocetirizine (XYZAL) 5 MG tablet Take 5 mg by mouth every evening.    [provider]  linaclotide (LINZESS) 145 MCG CAPS capsule Take 145 mcg by mouth daily as needed (for constipation.).    [provider]  losartan (COZAAR) 100 MG tablet Take 100 mg by mouth daily.    [provider]  montelukast (SINGULAIR) 10 MG tablet Take 10 mg by mouth at bedtime.     [provider]  Multiple Vitamin (MULTIVITAMIN WITH MINERALS) TABS tablet Take 1 tablet by mouth daily. Centrum silver    [provider]  Na Sulfate-K Sulfate-Mg Sulf 17.5-3.13-1.6 GM/177ML SOLN Take 1 kit by mouth as directed. 10/22/17   Rourk, Cristopher Estimable, MD  NON FORMULARY Inject 1 Dose as directed 2 (two) times a week. Allergy shot    [provider]  predniSONE (DELTASONE) 20 MG tablet 2 tabs po daily x 3 days 05/12/18   Milton Ferguson, MD  ranitidine (ZANTAC) 150 MG tablet Take 150 mg by mouth 2 (two) times daily. 09/07/17   [provider]  saccharomyces boulardii (FLORASTOR) 250 MG capsule Take 250 mg by mouth 2 (two) times daily.    [provider]  simvastatin (ZOCOR) 20 MG tablet Take 20 mg by mouth every evening. 11/19/17   [provider]    Family History Family History  Problem Relation Age of Onset  . Breast cancer Mother 56  . Colon cancer Neg Hx   . Colon polyps Neg Hx     Social History Social History   Tobacco Use  . Smoking status: Never Smoker  . Smokeless tobacco: Never Used  Substance Use Topics  . Alcohol use: Yes    Comment: rare wine  . Drug use: No     Allergies   Influenza vaccines; Sulfa antibiotics; Augmentin [amoxicillin-pot clavulanate]; Esomeprazole magnesium; Iodine; Lipitor [atorvastatin]; Norvasc [amlodipine besylate]; Omeprazole; Other; Pantoprazole sodium; Prevacid [lansoprazole]; Rabeprazole sodium;  Spironolactone; Sulfonamide derivatives; Terazosin; Tigan [trimethobenzamide hcl]; and Zithromax [azithromycin]   Review of Systems Review of Systems  Constitutional: Negative for appetite change and fatigue.  HENT: Negative for congestion, ear discharge and sinus pressure.   Eyes: Negative for discharge.  Respiratory: Positive for cough.   Cardiovascular: Negative for chest pain.  Gastrointestinal: Negative for abdominal pain and diarrhea.  Genitourinary: Negative for frequency and hematuria.  Musculoskeletal: Negative for back pain.  Skin: Negative for rash.  Neurological: Negative for seizures and headaches.  Psychiatric/Behavioral: Negative for hallucinations.     Physical Exam Updated Vital Signs BP (!) 171/87 (BP Location: Right Arm)   Pulse 88   Temp (!) 97.3 F (36.3 C) (Oral)   Resp 19   Ht '5\' 2"'  (1.575 m)  Wt 68.9 kg   SpO2 99%   BMI 27.80 kg/m   Physical Exam Vitals signs and nursing note reviewed.  Constitutional:      Appearance: She is well-developed.  HENT:     Head: Normocephalic.     Nose: Nose normal.  Eyes:     General: No scleral icterus.    Conjunctiva/sclera: Conjunctivae normal.  Neck:     Musculoskeletal: Neck supple.     Thyroid: No thyromegaly.  Cardiovascular:     Rate and Rhythm: Normal rate and regular rhythm.     Heart sounds: No murmur. No friction rub. No gallop.   Pulmonary:     Breath sounds: No stridor. Wheezing present. No rales.  Chest:     Chest wall: No tenderness.  Abdominal:     General: There is no distension.     Tenderness: There is no abdominal tenderness. There is no rebound.  Musculoskeletal: Normal range of motion.  Lymphadenopathy:     Cervical: No cervical adenopathy.  Skin:    Findings: No erythema or rash.  Neurological:     Mental Status: She is oriented to person, place, and time.     Motor: No abnormal muscle tone.     Coordination: Coordination normal.  Psychiatric:        Behavior: Behavior  normal.      ED Treatments / Results  Labs (all labs ordered are listed, but only abnormal results are displayed) Labs Reviewed - No data to display  EKG None  Radiology Dg Chest 2 View  Result Date: 05/12/2018 CLINICAL DATA:  Shortness of breath, cough EXAM: CHEST - 2 VIEW COMPARISON:  04/18/2015 FINDINGS: There is hyperinflation of the lungs compatible with COPD. Heart and mediastinal contours are within normal limits. No focal opacities or effusions. No acute bony abnormality. IMPRESSION: COPD.  No active disease. Electronically Signed   By: Rolm Baptise M.D.   On: 05/12/2018 20:06    Procedures Procedures (including critical care time)  Medications Ordered in ED Medications  predniSONE (DELTASONE) tablet 60 mg (has no administration in time range)  albuterol (PROVENTIL HFA;VENTOLIN HFA) 108 (90 Base) MCG/ACT inhaler 2 puff (has no administration in time range)  ipratropium-albuterol (DUONEB) 0.5-2.5 (3) MG/3ML nebulizer solution 3 mL (3 mLs Nebulization Given 05/12/18 1958)  albuterol (PROVENTIL) (2.5 MG/3ML) 0.083% nebulizer solution 2.5 mg (2.5 mg Nebulization Given 05/12/18 1958)     Initial Impression / Assessment and Plan / ED Course  I have reviewed the triage vital signs and the nursing notes.  Pertinent labs & imaging results that were available during my care of the patient were reviewed by me and considered in my medical decision making (see chart for details).        Chest x-ray shows no pneumonia.  Patient improved with neb treatment.  Suspect bronchospasm.  Patient given prednisone and albuterol inhaler  Final Clinical Impressions(s) / ED Diagnoses   Final diagnoses:  Cough    ED Discharge Orders         Ordered    predniSONE (DELTASONE) 20 MG tablet     05/12/18 2158           Milton Ferguson, MD 05/12/18 2205

## 2018-05-12 NOTE — ED Triage Notes (Signed)
Cough x 2 weeks. Pt is on abx for sinus infection.  Was told by pcp to come to ed for chest xray to r/o pne

## 2018-05-12 NOTE — ED Notes (Signed)
Patient transported to X-ray 

## 2018-09-29 ENCOUNTER — Ambulatory Visit: Payer: Medicare Other | Admitting: Gastroenterology

## 2018-11-28 ENCOUNTER — Ambulatory Visit: Payer: Medicare Other | Admitting: Gastroenterology

## 2018-12-05 ENCOUNTER — Other Ambulatory Visit: Payer: Self-pay

## 2018-12-05 ENCOUNTER — Ambulatory Visit (INDEPENDENT_AMBULATORY_CARE_PROVIDER_SITE_OTHER): Payer: Medicare Other | Admitting: Gastroenterology

## 2018-12-05 ENCOUNTER — Encounter: Payer: Self-pay | Admitting: Gastroenterology

## 2018-12-05 VITALS — BP 148/85 | HR 91 | Temp 95.9°F | Ht 62.0 in | Wt 154.0 lb

## 2018-12-05 DIAGNOSIS — K219 Gastro-esophageal reflux disease without esophagitis: Secondary | ICD-10-CM | POA: Diagnosis not present

## 2018-12-05 MED ORDER — ESOMEPRAZOLE MAGNESIUM 40 MG PO CPDR: 40.0000 mg | DELAYED_RELEASE_CAPSULE | Freq: Every day | ORAL | 5 refills | Status: DC

## 2018-12-05 NOTE — Progress Notes (Signed)
Primary Care Physician: Alinda Deem, MD  Primary Gastroenterologist:  Roetta Sessions, MD   Chief Complaint  Patient presents with  . Gastroesophageal Reflux    HPI: Rhonda Murphy is a 73 y.o. female here for follow-up.  She was last seen in August 2019.  She has a history of chronic constipation, left lower quadrant pain.  Linzess and Amitiza historically have been expensive so she is used Linzess 145 mcg samples as needed.  She did complete a CT June 2019, unrevealing for acute process.  Updated colonoscopy September 2019, diverticulosis only. Last EGD was in 2011, patent Schatzki ring status post dilation, esophageal biopsies were negative, duodenal biopsies negative.  Intolerant to PPIs in the past due to diarrhea and abdominal cramps.   Patient presents for reflux. She has had chronic sinus and gerd for decades. 8 years ago, sinuses got real bad. Took allergy shots which helped for about a year. In the past two years, she has had chronic sinus infection and has had two surgeries. She has had multiple rounds of both oral antibiotics as well as antibiotics given through irrigation. Never has long term results. More recently surgery 10/2018 at [facility] by Dr. Gillermina Hu. She states there is concern that GERD is contributing to her chronic infections. She was advised to come here for work up.  Per patient, she was found to have "pieces of acid reflux" in her sinuses and noted teeth issues related to acid reflux.  She has had short-lived resolution of sinusitis since her surgery on August 7.  Off antibiotics only for 2 weeks prior to recurrence of symptoms and now back on doxycycline.  Has an appointment on Friday with her ENT.  Patient states she has had reflux since her 30s.  Multiple PPIs have caused abdominal cramping and diarrhea in the past.  Was on Zantac for years until it was taken off the market.  More recently on famotidine 20 mg at bedtime.  Has been inadequate in controlling her  reflux.  Had to use Gaviscon as needed as well.  Recently ENT suspects GERD as a culprit of chronic sinus infections.  She added Nexium 20 mg over-the-counter in the morning to her regimen.  She has been on Nexium for about 16 days.  She has noted some improvement in her typical reflux symptoms but continues to have breakthrough symptoms several days per week.  Continues over-the-counter antacids for this.  Less regurgitation since on Nexium.  She has some mild epigastric discomfort.  Bowel movements controlled with Linzess 145 mcg as needed.  Typically depends on samples due to expense.  Uses over-the-counter Dulcolax sometimes.  Denies melena or rectal bleeding.  She has some vague swallowing issues only with dry chicken breast.  Everything else she swallows without any difficulties.   Surgery on 10/21/2018.  1. Bilateral nasal endoscopy with frontal sinusotomy, (CPT [id number]) 2. Bilateral nasal endoscopy total ethmoidectomy and sphenoidotomy with tissue removal (CPT 31259-50) 3. Bilateral medial maxillectomy without orbital exenteration (CPT 31225-50) 4. Bilateral nasal floor advancement flap <10 cm2 (CPT 14040-50) 5. Stereotactic Computer assisted naviagtion, extradural (CPT [id number])   Operative Findings:  1) Diffuse sinus disease with polyposis, purulence, osteitis, and synechiae throughout. 2) Fungal ball in left maxillary sinus. 3) Medial maxillectomies performed given extent of disease. Nasal floor mucosal advancement flaps utilized to cover exposed bone. 4) Middle turbinates resected bilaterally.  Cultures with E.coli and Streptococcus anginosus.  Path A: Left sinus contents, removal - Benign sinonasal mucosa with  severe chronic and allergic rhinosinusitis, nasal polyposis, and fragments of benign bone.  B: Right nasal contents, removal - Benign sinonasal mucosa with severe chronic and allergic rhinosinusitis, focal acute rhinosinusitis, nasal polyposis, focal fibrinopurulent debris,  and fragments of benign bone.  C: Left sinus contents (trap), removal - Predominantly blood with admixed fragments of benign sinonasal mucosa with severe chronic and allergic rhinosinusitis, and fragments of benign bone.  D: Left sinus contents (trap), removal - Predominantly blood with admixed fragments of benign sinonasal mucosa with severe chronic and allergic rhinosinusitis, and fragments of benign bone.  E: Right nasal contents (trap), removal - Predominantly blood with admixed fragments of benign sinonasal mucosa with severe mixed acute, chronic and allergic rhinosinusitis, and fragments of benign bone         Current Outpatient Medications  Medication Sig Dispense Refill  . acetaminophen (TYLENOL) 650 MG CR tablet Take 650 mg by mouth 2 (two) times daily as needed for pain.     Marland Kitchen. amitriptyline (ELAVIL) 25 MG tablet Take 25 mg by mouth at bedtime.    Marland Kitchen. amLODipine (NORVASC) 10 MG tablet Take 10 mg by mouth daily.    . chlorthalidone (HYGROTON) 25 MG tablet Take 25 mg by mouth daily.    . cloNIDine (CATAPRES) 0.1 MG tablet Take 0.1 mg by mouth 2 (two) times daily.    Marland Kitchen. docusate sodium (COLACE) 100 MG capsule Take 100-200 mg by mouth See admin instructions. Take 1 capsule (100 mg) by mouth in the morning & 2 capsules (200 mg) by mouth in the evening.    Marland Kitchen. doxycycline (VIBRA-TABS) 100 MG tablet Take 1 tablet by mouth 2 (two) times daily.    Marland Kitchen. EPINEPHrine 0.3 mg/0.3 mL IJ SOAJ injection Inject 0.3 mg into the muscle daily as needed for anaphylaxis.    Marland Kitchen. esomeprazole (NEXIUM) 20 MG capsule Take 20 mg by mouth daily.    . famotidine (PEPCID) 10 MG tablet Take 20 mg by mouth daily.     . fluticasone furoate-vilanterol (BREO ELLIPTA) 100-25 MCG/INH AEPB Inhale 1 puff into the lungs daily.    Marland Kitchen. linaclotide (LINZESS) 145 MCG CAPS capsule Take 145 mcg by mouth daily as needed (for constipation.).    Marland Kitchen. losartan (COZAAR) 100 MG tablet Take 100 mg by mouth daily.    . montelukast  (SINGULAIR) 10 MG tablet Take 10 mg by mouth at bedtime.     . Multiple Vitamin (MULTIVITAMIN WITH MINERALS) TABS tablet Take 1 tablet by mouth daily. Centrum silver    . NON FORMULARY Inject 1 Dose as directed. Allergy shot every 3 weeks    . saccharomyces boulardii (FLORASTOR) 250 MG capsule Take 250 mg by mouth 2 (two) times daily.    . simvastatin (ZOCOR) 20 MG tablet Take 20 mg by mouth every evening.  4  . spironolactone (ALDACTONE) 50 MG tablet Take 50 mg by mouth daily.    . Wheat Dextrin (BENEFIBER PO) Take 1 packet by mouth daily.     No current facility-administered medications for this visit.     Allergies as of 12/05/2018 - Review Complete 12/05/2018  Allergen Reaction Noted  . Influenza vaccines Rash 03/21/2013  . Sulfa antibiotics Anaphylaxis 03/21/2013  . Augmentin [amoxicillin-pot clavulanate] Other (See Comments) 11/25/2017  . Esomeprazole magnesium Diarrhea   . Iodine  10/25/2014  . Lipitor [atorvastatin] Other (See Comments) 07/13/2014  . Norvasc [amlodipine besylate] Other (See Comments) 10/25/2014  . Omeprazole    . Other Other (See Comments) 07/13/2014  . Pantoprazole  sodium Diarrhea   . Prevacid [lansoprazole] Diarrhea and Other (See Comments) 07/13/2014  . Rabeprazole sodium Diarrhea   . Spironolactone Other (See Comments) 07/13/2014  . Sulfonamide derivatives    . Terazosin Other (See Comments) 07/13/2014  . Tigan [trimethobenzamide hcl] Other (See Comments) 07/13/2014  . Zithromax [azithromycin] Other (See Comments) 07/13/2014   Past Medical History:  Diagnosis Date  . Acid reflux   . Arthritis   . Asthma   . Asthma   . Back pain   . C. difficile colitis 2011  . Enteritis 03/21/2013  . History of kidney stones   . Hypertension   . Influenza A 03/24/2013  . LBBB (left bundle branch block) 03/21/2013  . Mitral valve prolapse   . Shingles   . Small bowel obstruction ([facility]) 03/24/2013   SBO vs. enteritis.    Past Surgical History:  Procedure Laterality  Date  . ABDOMINAL HYSTERECTOMY    . APPENDECTOMY    . CATARACT EXTRACTION Bilateral   . CHOLECYSTECTOMY    . COLONOSCOPY  2010   Dr. Samuella CotaPandya  . COLONOSCOPY WITH PROPOFOL N/A 12/02/2017   Dr. Jena Gaussourk: diverticulosis.   Marland Kitchen. ESOPHAGOGASTRODUODENOSCOPY  2011   Dr. Jena Gaussourk: patent Schatzki's ring s/p dilation, esophageal biopsies negative, duodenal biopsies negative. Intolerant to PPIs in past and took H2 blockers  . EXPLORATORY LAPAROTOMY     in her 30s, removed appendix, hysterectomy, cholecystectomy, varicose veins in abdomen  . JOINT REPLACEMENT     Rt knee  . RENAL ARTERY STENT  2019   [place]  . TONSILLECTOMY    . 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 TOTAL KNEE REVISION Left 04/16/2015   Procedure: TOTAL KNEE REVISION;  Surgeon: Kennedy BuckerMichael Menz, MD;  Location: [facility] ORS;  Service: Orthopedics;  Laterality: Left;   Family History  Problem Relation Age of Onset  . Breast cancer Mother 8195  . Colon cancer Neg Hx   . Colon polyps Neg Hx    Social History   Tobacco Use  . Smoking status: Never Smoker  . Smokeless tobacco: Never Used  Substance Use Topics  . Alcohol use: Yes    Comment: rare wine  . Drug use: No    ROS:  General: Negative for anorexia, weight loss, fever, chills, fatigue, weakness. ENT: Negative for hoarseness, difficulty swallowing , nasal congestion. CV: Negative for chest pain, angina, palpitations, dyspnea on exertion, peripheral edema.  Respiratory: Negative for dyspnea at rest, dyspnea on exertion, cough, sputum, wheezing.  GI: See history of present illness. GU:  Negative for dysuria, hematuria, urinary incontinence, urinary frequency, nocturnal urination.  Endo: Negative for unusual weight change.    Physical Examination:   BP (!) 148/85   Pulse 91   Temp (!) 95.9 F (35.5 C) (Temporal)   Ht 5\' 2"  (1.575 m)   Wt 154 lb  (69.9 kg)   BMI 28.17 kg/m   General: Well-nourished, well-developed in no acute distress.  Eyes: No icterus. Mouth: Oropharyngeal mucosa moist and pink , no lesions erythema or exudate. Lungs: Clear to auscultation bilaterally.  Heart: Regular rate and rhythm, no murmurs rubs or gallops.  Abdomen: Bowel sounds are normal, nontender, nondistended, no hepatosplenomegaly or masses, no abdominal bruits or hernia , no rebound or guarding.   Extremities: No lower extremity edema. No clubbing or deformities. Neuro: Alert and oriented x 4   Skin: Warm and dry,  no jaundice.   Psych: Alert and cooperative, normal mood and affect.

## 2018-12-05 NOTE — Patient Instructions (Signed)
1. Stop over the counter Nexium. Begin Nexium 40mg  daily before breakfast. RX sent to your pharmacy. You can continue famotidine 20mg  at bedtime.  2. I will touch base with Dr. Gala Romney regarding reflux work up and we will let you know if he recommends upper endoscopy as next step.

## 2018-12-05 NOTE — Assessment & Plan Note (Signed)
Very pleasant 73 year old female with history of chronic sinusitis issues requiring 2 surgeries since 2018, almost persistent antibiotic therapy for management.  Last surgery August 7, has only been off antibiotics for about 2 weeks since surgery before recurrence of symptoms.  ENT, Dr. Johny Shock, concern GERD may be contributing to her chronic sinus disease.  Patient has chronic GERD, typical reflux which is not and ideally managed.  Historically has been intolerant to PPI therapy due to abdominal cramping and diarrhea.  Has been treated predominantly with H2 blockers.  Recently she added Nexium 20 mg daily along to her Pepcid 20 mg at night.  She has had some improvement in her typical reflux and no evidence of GI intolerance to medications.  We discussed need for higher-dose PPI therapy for management of poorly controlled reflux.  First and foremost, would like to see her typical reflux well controlled.  Has some dysphagia to dry chicken breast but otherwise no dysphagia.  Prior history of Schatzki ring requiring dilation in the remote past.  She would likely benefit from an upper endoscopy to evaluate extent of her acid reflux disease.  She requests that Dr. Gala Romney have been good prior to scheduling.  We will discuss with him when he returns to the office this week.  In the meantime increase Nexium to 40 mg daily before breakfast. Continue pepcid 20mg  at bedtime. Will see how she tolerates medication and increase if needed.

## 2018-12-07 ENCOUNTER — Telehealth: Payer: Self-pay

## 2018-12-07 ENCOUNTER — Telehealth: Payer: Self-pay | Admitting: Internal Medicine

## 2018-12-07 NOTE — Telephone Encounter (Signed)
[phone] PLEASE CALL PATIENT ABOUT HER PRESCRIPTION, THE ONE SENT IN IS STILL VERY EXPENSIVE AND HER PHARMACIST MENTIONED OMEPRAZOLE AS AN OPTION.

## 2018-12-07 NOTE — Telephone Encounter (Signed)
PA for Nexium was submitted and approved. Pt spoke with her pharmacy and the Nexium is around $75.00 with the approval of pt's insurance. The pharmacist mentioned Omeprazole 40 mg delayed release is $15.00 per month or $38 for 3 month supply. Pt can also use a GoodRx card and get the Nexium for $25.00 per month is LSL would prefer that she stays on the Nexium. Please advise.

## 2018-12-07 NOTE — Telephone Encounter (Signed)
PA for Esomeprazole Magnesium 40 mg was submitted through [email or web address]. waiting on an approval or denial.

## 2018-12-07 NOTE — Telephone Encounter (Signed)
Esomeprazole was approved. Pt is aware of approval and will notify her pharmacy. Approval letter will be scanned in pts chart when received.

## 2018-12-12 NOTE — Telephone Encounter (Signed)
Let's go with Goodrx option with Nexium because patient has a lot of issues with allergies. Omeprazole is listed as one of her allergies (can we find out what happens when she takes it).

## 2018-12-12 NOTE — Telephone Encounter (Signed)
Spoke with pt. She isn't able to remember the reaction she got when taking Omeprazole. Pt is aware that Nexium was sent to her pharmacy. GoodRx cards were mailed to pt.    LSL, pt wants to know if you had a chance to discuss with RMR about her sinus surgeries, hiccups and trouble swallowing? Pt states her doctor from [facility] feels she needs to do an EGD due to her symptoms per pt. Pt also states she has to continue to have sinus surgeries due to the acid reflux per her provider.

## 2018-12-12 NOTE — Telephone Encounter (Signed)
Lmom, waiting on a return call.  

## 2018-12-12 NOTE — Telephone Encounter (Addendum)
Yes. Dr. Gala Romney wants her to take increased Nexium 40mg  every morning along with the Pepcid at night as discussed when she was in the office.   Unless she is having dysphagia (feels like food sticking in her chest), he does NOT advise EGD right now. Although sinus issues can be a result of gerd he states clinically he has not seen a significant correlation.   Patient told me in the office that she has dysphagia to chicken. If she is having swallowing issues then would advise with rmr with propofol. Dx would be dysphagia, refractory gerd

## 2018-12-13 NOTE — Telephone Encounter (Signed)
Spoke with pt. She's going to take the Nexium 40 mg qam and the Pepcid qpm. Pt is going to follow the symptoms she's having and if she starts having the swallowing problems, pt will call back.

## 2018-12-30 ENCOUNTER — Telehealth: Payer: Self-pay | Admitting: Internal Medicine

## 2018-12-30 NOTE — Telephone Encounter (Signed)
Pt has questions about Nexium and Pepcid reactions. Please call [phone]

## 2019-01-02 ENCOUNTER — Telehealth: Payer: Self-pay | Admitting: Internal Medicine

## 2019-01-02 DIAGNOSIS — R197 Diarrhea, unspecified: Secondary | ICD-10-CM

## 2019-01-02 NOTE — Telephone Encounter (Signed)
Diarrhea is not likely due to Nexium.  I am concerned Rhonda Murphy may have Cdiff due to multiple courses of antibiotics for her sinus infections.   Check Cdiff gdh asap. Drink plenty of fluid to keep urine light yellow. Use bleach type cleaners in the bathroom and wash hands with soap/water.  Rhonda Murphy can hold Nexium for now. Continue pepcid.  Start a probiotics such as [employer].

## 2019-01-02 NOTE — Telephone Encounter (Signed)
Pt called back to give [place] the name of lab.  It is [facility]. Phone: [phone] Fax: [phone]

## 2019-01-02 NOTE — Addendum Note (Signed)
Addended by: Mahala Menghini on: 01/02/2019 12:59 PM   Modules accepted: Orders

## 2019-01-02 NOTE — Telephone Encounter (Signed)
LMOM to call.

## 2019-01-02 NOTE — Telephone Encounter (Signed)
PT is aware that order has been faxed.

## 2019-01-02 NOTE — Telephone Encounter (Signed)
PT said she thinks the Nexium was giving her diarrhea.  She had 13 episodes of diarrhea on Friday. She called the pharmacist on Sat and explained to him. He said he had never heard of it doing that, but to [place] for a few days. She took [employer]. Sunday she had 3-4 Loose/watery Bm's. So last night she did not take Pepcid. This morning she has already had 4 Lose/watery BM's. She said it smells as if it is infection. She last saw Neil Crouch, PA on 12/05/2018.  Magda Paganini, please advise!

## 2019-01-02 NOTE — Telephone Encounter (Signed)
PATIENT CALLED AND SAID THAT NEXIUM WAS MAKING HER SICK ON HER STOMACH  PLEASE CALL HER SHE HAS REFLUX AND CAN NOT STOP GOING TO THE BATHROOM [phone]

## 2019-01-02 NOTE — Telephone Encounter (Signed)
Pt is aware of the plan and instructions. She will find out which lab she wants to use and call back and let me know.

## 2019-01-03 ENCOUNTER — Other Ambulatory Visit: Payer: Self-pay

## 2019-01-03 NOTE — Telephone Encounter (Signed)
Per Neil Crouch, PA, the pt needs to go to [facility] to do the test she is ordering to see if pt is a carrier or infected. Pt is aware and OK with that, she will have her husband pick up the container and then return to [place]. I will fax [facility] to disregard the order faxed to them.

## 2019-01-03 NOTE — Telephone Encounter (Signed)
See phone note of 01/02/2019.

## 2019-01-05 ENCOUNTER — Telehealth: Payer: Self-pay | Admitting: *Deleted

## 2019-01-05 LAB — C. DIFFICILE GDH AND TOXIN A/B
GDH ANTIGEN: NOT DETECTED
MICRO NUMBER:: 1015313
SPECIMEN QUALITY:: ADEQUATE
TOXIN A AND B: NOT DETECTED

## 2019-01-05 NOTE — Telephone Encounter (Signed)
[phone] Wants lab results.

## 2019-01-09 NOTE — Telephone Encounter (Signed)
See results that have been routed to [place].

## 2019-01-09 NOTE — Telephone Encounter (Signed)
Pt notified of results. See result. Notes.

## 2019-01-25 ENCOUNTER — Encounter: Payer: Self-pay | Admitting: Gastroenterology

## 2019-01-25 ENCOUNTER — Ambulatory Visit (INDEPENDENT_AMBULATORY_CARE_PROVIDER_SITE_OTHER): Payer: Medicare Other | Admitting: Gastroenterology

## 2019-01-25 ENCOUNTER — Other Ambulatory Visit: Payer: Self-pay

## 2019-01-25 VITALS — BP 158/80 | HR 83 | Temp 97.6°F | Ht 62.0 in | Wt 149.0 lb

## 2019-01-25 DIAGNOSIS — K219 Gastro-esophageal reflux disease without esophagitis: Secondary | ICD-10-CM | POA: Diagnosis not present

## 2019-01-25 DIAGNOSIS — R197 Diarrhea, unspecified: Secondary | ICD-10-CM | POA: Diagnosis not present

## 2019-01-25 DIAGNOSIS — R1012 Left upper quadrant pain: Secondary | ICD-10-CM | POA: Diagnosis not present

## 2019-01-25 NOTE — Patient Instructions (Signed)
1. Take Imodium 2mg  with first loose stool of day. May take up to three times if needed. 2. Collect stool and take to lab. Please have your labs done as well. Call if you have not heard about results within 3 business days after collection.  3. We will call to schedule CT scan once I discuss your iodine allergy with the radiologist.

## 2019-01-25 NOTE — Progress Notes (Signed)
Primary Care Physician: Alinda Deem, MD  Primary Gastroenterologist:  Roetta Sessions, MD   Chief Complaint  Patient presents with  . Gastroesophageal Reflux    stopped nexium reports it was causing diarrhea, started pepcid in its place but reports it caused diarrhea. stopped pepcid 2 days ago  . Diarrhea    daily x 2-3 months. has tried imodium and did nothing    HPI: Rhonda Murphy is a 73 y.o. female here for follow-up of diarrhea.  She was seen back in September.  She historically has a history of chronic constipation taking Linzess samples as needed.  Back in September she was concerned about possibility of reflux contributing to her chronic sinus disease.  She was told by her ENT that her reflux was likely contributing to it.  She had had multiple rounds of antibiotics this year as well as sinus surgery in August 2020.  She has had difficulty tolerating antacids/PPIs in the past due to abdominal cramping and diarrhea.  She was on Zantac for years until it was taken off the market.  Famotidine 20 mg at bedtime was inadequate at controlling her reflux.  She added over-the-counter Nexium 20 mg daily, previously developed diarrhea with Nexium 40 mg, however seem to tolerate it.  See office note from 12/05/2018 for detailed history.  Previously pantoprazole caused abdominal cramping, Prevacid caused abdominal cramping, AcipHex caused abdominal cramping, omeprazole listed as an allergy but she cannot recall what happened.  At time of her last office visit we increased her Nexium to 40 mg before breakfast and continued famotidine 20 mg at bedtime.  Last colonoscopy September 2019, diverticulosis.  Was not having diarrhea at the time.  EGD in 2011 with patent Schatzki ring status post dilation, esophageal biopsies were negative, duodenal biopsies negative.  Patient called in on October 19 complaining of profuse diarrhea.  She was concerned it was related to the Nexium therefore that was  stopped.  Because of her history of C. difficile in the past and recent multiple rounds of antibiotics we did have her go for testing.  C. difficile GDH/toxin A+B were negative.  We advised her to hold the Nexium and see what happens.  Today she states she has been having sometimes more than 10 stools per day.  She is not taking Linzess.  She has stopped Nexium.  She has tried Pepcid twice daily because of her reflux issues but continues to have diarrhea.  She is not sure if this is the medication.  Complains of nocturnal diarrhea.  If she drinks water she has diarrhea.  Stools are watery, light brown.  Some greasy look to her stools.  No melena or rectal bleeding.  She tried Imodium which did slow down her stools but of course her diarrhea returned within a day or 2.  No weight loss.  She has had some left upper quadrant pain which radiates into her back.  Not severe.  No nausea or vomiting.  She is concerned of her family history of pancreatic cancer, father and aunt both succumbed to the illness.  Chronically on Florastor.  Current Outpatient Medications  Medication Sig Dispense Refill  . acetaminophen (TYLENOL) 650 MG CR tablet Take 650 mg by mouth 2 (two) times daily as needed for pain.     Marland Kitchen amitriptyline (ELAVIL) 25 MG tablet Take 25 mg by mouth at bedtime.    Marland Kitchen amLODipine (NORVASC) 10 MG tablet Take 10 mg by mouth daily.    Marland Kitchen  chlorthalidone (HYGROTON) 25 MG tablet Take 25 mg by mouth daily.    . cloNIDine (CATAPRES) 0.1 MG tablet Take 0.1 mg by mouth 2 (two) times daily.    Marland Kitchen EPINEPHrine 0.3 mg/0.3 mL IJ SOAJ injection Inject 0.3 mg into the muscle daily as needed for anaphylaxis.    . fluticasone furoate-vilanterol (BREO ELLIPTA) 100-25 MCG/INH AEPB Inhale 1 puff into the lungs daily.    Marland Kitchen linaclotide (LINZESS) 145 MCG CAPS capsule Take 145 mcg by mouth daily as needed (for constipation.).    Marland Kitchen losartan (COZAAR) 100 MG tablet Take 100 mg by mouth daily.    . montelukast (SINGULAIR) 10 MG  tablet Take 10 mg by mouth at bedtime.     . Multiple Vitamin (MULTIVITAMIN WITH MINERALS) TABS tablet Take 1 tablet by mouth daily. Centrum silver    . NON FORMULARY Inject 1 Dose as directed. Allergy shot every 3 weeks    . saccharomyces boulardii (FLORASTOR) 250 MG capsule Take 250 mg by mouth 2 (two) times daily.    . simvastatin (ZOCOR) 20 MG tablet Take 20 mg by mouth every evening.  4  . spironolactone (ALDACTONE) 50 MG tablet Take 50 mg by mouth daily.     No current facility-administered medications for this visit.     Allergies as of 01/25/2019 - Review Complete 01/25/2019  Allergen Reaction Noted  . Influenza vaccines Rash 03/21/2013  . Sulfa antibiotics Anaphylaxis 03/21/2013  . Augmentin [amoxicillin-pot clavulanate] Other (See Comments) 11/25/2017  . Esomeprazole magnesium Diarrhea   . Iodine  10/25/2014  . Lipitor [atorvastatin] Other (See Comments) 07/13/2014  . Norvasc [amlodipine besylate] Other (See Comments) 10/25/2014  . Omeprazole    . Other Other (See Comments) 07/13/2014  . Pantoprazole sodium Diarrhea   . Prevacid [lansoprazole] Diarrhea and Other (See Comments) 07/13/2014  . Rabeprazole sodium Diarrhea   . Spironolactone Other (See Comments) 07/13/2014  . Sulfonamide derivatives    . Terazosin Other (See Comments) 07/13/2014  . Tigan [trimethobenzamide hcl] Other (See Comments) 07/13/2014  . Zithromax [azithromycin] Other (See Comments) 07/13/2014    ROS:  General: Negative for anorexia,  fever, chills, fatigue, weakness.  Weight down just a couple pounds since September.  Weight tends to fluctuate 5 pounds or more. ENT: Negative for hoarseness, difficulty swallowing , nasal congestion. CV: Negative for chest pain, angina, palpitations, dyspnea on exertion, peripheral edema.  Respiratory: Negative for dyspnea at rest, dyspnea on exertion, cough, sputum, wheezing.  GI: See history of present illness. GU:  Negative for dysuria, hematuria, urinary  incontinence, urinary frequency, nocturnal urination.  Endo: Negative for unusual weight change.    Physical Examination:   BP (!) 158/80   Pulse 83   Temp 97.6 F (36.4 C) (Oral)   Ht 5\' 2"  (1.575 m)   Wt 149 lb (67.6 kg)   BMI 27.25 kg/m   General: Well-nourished, well-developed in no acute distress.  Eyes: No icterus. Mouth: Oropharyngeal mucosa moist and pink , no lesions erythema or exudate. Lungs: Clear to auscultation bilaterally.  Heart: Regular rate and rhythm, no murmurs rubs or gallops.  Abdomen: Bowel sounds are normal, mild bilateral upper quadrant tenderness, nondistended, no hepatosplenomegaly or masses, no abdominal bruits or hernia , no rebound or guarding.   Extremities: No lower extremity edema. No clubbing or deformities. Neuro: Alert and oriented x 4   Skin: Warm and dry, no jaundice.   Psych: Alert and cooperative, normal mood and affect.   Imaging Studies: No results found.

## 2019-01-26 ENCOUNTER — Other Ambulatory Visit: Payer: Self-pay | Admitting: Gastroenterology

## 2019-01-26 NOTE — Assessment & Plan Note (Signed)
Abrupt change in bowel habits, with profuse diarrhea.  Complains of bilateral upper quadrant abdominal pain.  Left upper quadrant pain with radiation into the back.  Family history of pancreatic cancer in 2 relatives.  Consider CT abdomen pelvis with contrast to evaluate her abdominal pain and diarrhea.  Obtain labs, further stool studies.  I have discussed her topical iodine allergy with radiology.  She can received IV contrast without any issues.  No need for premedication.  We will go ahead and schedule CT abdomen and pelvis with contrast.

## 2019-01-26 NOTE — Assessment & Plan Note (Signed)
History of multiple PPI intolerances.  Previously tolerated ranitidine until was taken off the market.  She has chronic GERD but also reports that her ENT was concerned that her chronic sinusitis may be stemming from her reflux.  At this time because of significant diarrhea, unclear etiology and potentially related to Nexium, I have asked her to stop Nexium for now.  Would like her hold Pepcid for a week or so to see if this makes any difference on her diarrhea.  We will resume in the near future, possibly cimetidine, as there really are no other PPI options due to her multiple intolerances.  Patient voiced understanding.

## 2019-01-26 NOTE — Assessment & Plan Note (Signed)
Abrupt change in bowel function from chronically constipation to profuse diarrhea.  C. difficile GDH/toxin is negative.  Last colonoscopy in 2019 showed diverticulosis, she was not having any diarrhea at the time.  Send stool for GI pathogen panel.  Screen for celiac disease.  Check thyroid status.  We will have her hold Nexium and Pepcid for now to see if diarrhea improves.  Start Imodium.  If no etiology determined for source of diarrhea, symptoms not improved, cannot rule out possibility of needing repeat colonoscopy with random colon biopsies.

## 2019-01-27 LAB — LIPASE: Lipase: 33 U/L (ref 14–85)

## 2019-01-27 LAB — CBC WITH DIFFERENTIAL/PLATELET
Basophils Absolute: 0.1 10*3/uL (ref 0.0–0.2)
Basos: 1 %
EOS (ABSOLUTE): 0.5 10*3/uL — ABNORMAL HIGH (ref 0.0–0.4)
Eos: 7 %
Hematocrit: 37.6 % (ref 34.0–46.6)
Hemoglobin: 12.1 g/dL (ref 11.1–15.9)
Immature Grans (Abs): 0 10*3/uL (ref 0.0–0.1)
Immature Granulocytes: 0 %
Lymphocytes Absolute: 1.6 10*3/uL (ref 0.7–3.1)
Lymphs: 25 %
MCH: 28.3 pg (ref 26.6–33.0)
MCHC: 32.2 g/dL (ref 31.5–35.7)
MCV: 88 fL (ref 79–97)
Monocytes Absolute: 0.3 10*3/uL (ref 0.1–0.9)
Monocytes: 5 %
Neutrophils Absolute: 4 10*3/uL (ref 1.4–7.0)
Neutrophils: 62 %
Platelets: 408 10*3/uL (ref 150–450)
RBC: 4.28 x10E6/uL (ref 3.77–5.28)
RDW: 12 % (ref 11.7–15.4)
WBC: 6.6 10*3/uL (ref 3.4–10.8)

## 2019-01-27 LAB — COMPREHENSIVE METABOLIC PANEL
ALT: 15 IU/L (ref 0–32)
AST: 26 IU/L (ref 0–40)
Albumin/Globulin Ratio: 1.5 (ref 1.2–2.2)
Albumin: 4.8 g/dL — ABNORMAL HIGH (ref 3.7–4.7)
Alkaline Phosphatase: 127 IU/L — ABNORMAL HIGH (ref 39–117)
BUN/Creatinine Ratio: 27 (ref 12–28)
BUN: 32 mg/dL — ABNORMAL HIGH (ref 8–27)
Bilirubin Total: <0.2 mg/dL (ref 0.0–1.2)
CO2: 21 mmol/L (ref 20–29)
Calcium: 10.2 mg/dL (ref 8.7–10.3)
Chloride: 99 mmol/L (ref 96–106)
Creatinine, Ser: 1.2 mg/dL — ABNORMAL HIGH (ref 0.57–1.00)
GFR calc Af Amer: 51 mL/min/{1.73_m2} — ABNORMAL LOW (ref >59)
GFR calc non Af Amer: 44 mL/min/{1.73_m2} — ABNORMAL LOW (ref >59)
Globulin, Total: 3.1 g/dL (ref 1.5–4.5)
Glucose: 103 mg/dL — ABNORMAL HIGH (ref 65–99)
Potassium: 4.8 mmol/L (ref 3.5–5.2)
Sodium: 136 mmol/L (ref 134–144)
Total Protein: 7.9 g/dL (ref 6.0–8.5)

## 2019-01-27 LAB — TSH+FREE T4
Free T4: 1.31 ng/dL (ref 0.82–1.77)
TSH: 4.09 u[IU]/mL (ref 0.450–4.500)

## 2019-01-27 LAB — TISSUE TRANSGLUTAMINASE, IGA: Transglutaminase IgA: <2 U/mL (ref 0–3)

## 2019-01-27 LAB — IGA: IgA/Immunoglobulin A, Serum: 219 mg/dL (ref 64–422)

## 2019-01-30 LAB — GI PROFILE, STOOL, PCR

## 2019-01-31 ENCOUNTER — Other Ambulatory Visit: Payer: Self-pay | Admitting: Gastroenterology

## 2019-01-31 ENCOUNTER — Telehealth: Payer: Self-pay | Admitting: Internal Medicine

## 2019-01-31 MED ORDER — DEXILANT 60 MG PO CPDR: 60.0000 mg | DELAYED_RELEASE_CAPSULE | Freq: Every day | ORAL | 3 refills | Status: DC

## 2019-01-31 NOTE — Telephone Encounter (Signed)
Noted  

## 2019-01-31 NOTE — Telephone Encounter (Signed)
See result note.  

## 2019-01-31 NOTE — Telephone Encounter (Signed)
Spoke to pt. She was calling to see if her stool results were in. Pt is aware that her results were negative for infection. Pt saw the ENT yesterday and was told her reflux is getting bad and she needs to be on a PPI. Pt was previously on Nexium and Pepcid. Pt states those were causing diarrhea and she's ready to get back on a medication.

## 2019-01-31 NOTE — Telephone Encounter (Signed)
Pt was checking on her lab results and also had questions about her medications. [phone]

## 2019-02-01 ENCOUNTER — Other Ambulatory Visit: Payer: Self-pay | Admitting: *Deleted

## 2019-02-01 DIAGNOSIS — R198 Other specified symptoms and signs involving the digestive system and abdomen: Secondary | ICD-10-CM

## 2019-02-01 DIAGNOSIS — R197 Diarrhea, unspecified: Secondary | ICD-10-CM

## 2019-02-01 DIAGNOSIS — R1012 Left upper quadrant pain: Secondary | ICD-10-CM

## 2019-02-03 ENCOUNTER — Other Ambulatory Visit: Payer: Self-pay

## 2019-02-03 DIAGNOSIS — Z20822 Contact with and (suspected) exposure to covid-19: Secondary | ICD-10-CM

## 2019-02-06 LAB — NOVEL CORONAVIRUS, NAA: SARS-CoV-2, NAA: NOT DETECTED

## 2019-02-07 ENCOUNTER — Telehealth: Payer: Self-pay | Admitting: *Deleted

## 2019-02-07 ENCOUNTER — Telehealth: Payer: Self-pay | Admitting: Internal Medicine

## 2019-02-07 NOTE — Telephone Encounter (Signed)
Spoke with pt. 1) when pt is off Desilant and Nexium, she says the diarrhea stops. Pt also stated she had been off the [place] for two days and the had diarrhea this morning. Pt took one Dexilant today after the diarrhea started and was asked to d/c per LSL today. Pt was advised to address the fever with PCP. Pt d/c Pepcid when she started the [place] and hasn't taken any more. Pt was advised to go to the ED if she gets abd pain, fever or diarrhea worsens.

## 2019-02-07 NOTE — Telephone Encounter (Signed)
Spoke with pt. Pt is continuing to have diarrhea. Pt started the Dexilant 60 mg 1 week ago. After taking the medication for 2 days, she states the diarrhea started again. Pt stopped it two days and restarted it. Pt is having some abdominal pain pressure that comes on after eating. The pain will last for 3-4 hours. Pt is taking Imodium up to 3 times a day. Pt has a fever that has come and gone for 1 week. Pt's fever is 100.5. pt hasn't discussed this fever with her PCP. Pt states she went to the ENT last Monday and she was told she didn't have any infection with her sinuses. Diarrhea is soft and when pt wipes it, and the color looks like honey or peanut butter. Pt describes her stool as being loose.

## 2019-02-07 NOTE — Telephone Encounter (Signed)
[phone]  PATIENT SAID THAT SHE IS TAKING IMMODIUM AND IS STILL HAVING DIARRHEA ABOUT 3 TIMES A DAY.  CAN SOMETHING ELSE BE CALLED IN TO HER PHARMACY   SHE USES [facility] IN [place]

## 2019-02-07 NOTE — Telephone Encounter (Signed)
1. Please clarify: Does the diarrhea go away when she is off the nexium and Dexilant? 2. She should address fever with PCP. 3. We need to move CT up sooner please.  4. Stop Dexilant.  5. Is she taking pepcid or did she stop that? 6. If abd pain, fever, diarrhea worsen, she should go to ER.

## 2019-02-07 NOTE — Telephone Encounter (Signed)
Patient was given negative covid results

## 2019-02-08 ENCOUNTER — Other Ambulatory Visit: Payer: Self-pay

## 2019-02-08 MED ORDER — CIMETIDINE 400 MG PO TABS: 400.0000 mg | ORAL_TABLET | Freq: Two times a day (BID) | ORAL | 1 refills | Status: DC

## 2019-02-08 NOTE — Telephone Encounter (Signed)
Spoke with pt. She didn't tolerate the Pepcid per pt. Sent Rx Cimetidine 400 mg bid with 1 rf to pts pharmacy.

## 2019-02-08 NOTE — Telephone Encounter (Signed)
If she tolerates Pepcid I would restart at 20 to 40mg  BID.   If she does not feel she tolerates Pepcid, then we can switch to cimetidine 400mg  BID.can send in RX for #60 and 1 refill.  Let's move CT up sooner.  She needs ov with rmr only.

## 2019-02-08 NOTE — Telephone Encounter (Signed)
Apt scheduled with RMR on 02/21/2019 @ 8:30 AM.

## 2019-02-08 NOTE — Telephone Encounter (Signed)
CT moved up to 02/13/19 at 3:30pm, arrive at 3:15pm. NPO 4 hours before test.   Called and informed pt. She already has contrast.

## 2019-02-13 ENCOUNTER — Ambulatory Visit (HOSPITAL_COMMUNITY)
Admission: RE | Admit: 2019-02-13 | Discharge: 2019-02-13 | Disposition: A | Discharge status: Home / Self Care | Payer: Medicare Other | Source: Ambulatory Visit | Attending: Gastroenterology | Admitting: Gastroenterology

## 2019-02-13 ENCOUNTER — Other Ambulatory Visit: Payer: Self-pay

## 2019-02-13 DIAGNOSIS — R197 Diarrhea, unspecified: Secondary | ICD-10-CM | POA: Diagnosis present

## 2019-02-13 DIAGNOSIS — R198 Other specified symptoms and signs involving the digestive system and abdomen: Secondary | ICD-10-CM | POA: Insufficient documentation

## 2019-02-13 DIAGNOSIS — R1012 Left upper quadrant pain: Secondary | ICD-10-CM | POA: Insufficient documentation

## 2019-02-13 MED ORDER — IOHEXOL 300 MG/ML  SOLN
75.0000 mL | Freq: Once | INTRAMUSCULAR | Status: AC | PRN
  Administered 2019-02-13: 75 mL via INTRAVENOUS

## 2019-02-13 MED ORDER — DICYCLOMINE HCL 10 MG PO CAPS: 10.0000 mg | ORAL_CAPSULE | Freq: Three times a day (TID) | ORAL | 1 refills | Status: DC | PRN

## 2019-02-13 NOTE — Telephone Encounter (Signed)
I have no other PPIs or H2 blockers I can recommend for GERD as she has had side effects to all of them.  Await CT findings. If unremarkable, then she will need TCS with random colon biopsies for diarrhea. Will hold off on scheduling until after CT back.   Keep RMR appt next month as planned.   Start bentyl for diarrhea.

## 2019-02-13 NOTE — Telephone Encounter (Signed)
Left a detailed message for pt. Pt can call back if she needs to discuss further. Bentyl was sent into pt's pharmacy by LSL today.

## 2019-02-13 NOTE — Telephone Encounter (Signed)
VM received today 02/13/2019. Pt called to left LSL know that she continues to have the diarrhea. Pt states she has d/c the Cimetidine called in Thursday 02/09/2019 , after taking it for 1 day. Pt states she has a CT scan today and asked 1 week ago for anti-diarrhea medication. Imodium doesn't help.

## 2019-02-13 NOTE — Addendum Note (Signed)
Addended by: Mahala Menghini on: 02/13/2019 12:23 PM   Modules accepted: Orders

## 2019-02-13 NOTE — Telephone Encounter (Signed)
Patient made aware of recommendations below from [place].

## 2019-02-16 ENCOUNTER — Telehealth: Payer: Self-pay | Admitting: Internal Medicine

## 2019-02-16 ENCOUNTER — Other Ambulatory Visit: Payer: Self-pay | Admitting: Gastroenterology

## 2019-02-16 NOTE — Telephone Encounter (Signed)
Noted. Pt notified of LSL's recommendations and will go to the ED if needed. Pt is going to increase the dosage to 20 mg at bedtime.

## 2019-02-16 NOTE — Telephone Encounter (Addendum)
We can have her increase bentyl to 20mg  at bedtime. Continue 10mg  before meals (30 minutes before she eats) if she feels like it is helping some.   Other option is to try pancreatic enzymes for possible pancreatic exocrine insufficiency. LET ME KNOW WHAT SHE DECIDES REGARDING INCREASE BENTYL OR ADD CREON.  Drink enough fluids to get urine light yellow.   If she has fever, abd pain, feels weak/lightheaded etc go to ER.   Keep upcoming appt.

## 2019-02-16 NOTE — Telephone Encounter (Signed)
Pt called to let LSL know that she is still having diarrhea with the Bentyl. Pt is having 6 bowel movements since she started the Bentyl. Pt was having 10 bowel movements daily before taking the Bentyl. Pt states she's eating foods that should upset her abdomen. The only dairy pt is eating is [place] Yogurt in the mornings. Pt gets some abdominal cramping when it's time to have a bowel movement and wakes up around 1 am, 3 am, to have a bowel movement.

## 2019-02-16 NOTE — Telephone Encounter (Signed)
Pt has questions about her medication. [phone]

## 2019-02-20 ENCOUNTER — Ambulatory Visit (HOSPITAL_COMMUNITY): Payer: Medicare Other

## 2019-02-21 ENCOUNTER — Other Ambulatory Visit (HOSPITAL_COMMUNITY)
Admission: RE | Admit: 2019-02-21 | Discharge: 2019-02-21 | Disposition: A | Discharge status: Home / Self Care | Payer: Medicare Other | Source: Ambulatory Visit | Attending: Internal Medicine | Admitting: Internal Medicine

## 2019-02-21 ENCOUNTER — Encounter: Payer: Self-pay | Admitting: Internal Medicine

## 2019-02-21 ENCOUNTER — Other Ambulatory Visit: Payer: Self-pay

## 2019-02-21 ENCOUNTER — Ambulatory Visit (INDEPENDENT_AMBULATORY_CARE_PROVIDER_SITE_OTHER): Payer: Medicare Other | Admitting: Internal Medicine

## 2019-02-21 DIAGNOSIS — Z888 Allergy status to other drugs, medicaments and biological substances status: Secondary | ICD-10-CM | POA: Diagnosis not present

## 2019-02-21 DIAGNOSIS — R197 Diarrhea, unspecified: Secondary | ICD-10-CM | POA: Diagnosis not present

## 2019-02-21 DIAGNOSIS — K838 Other specified diseases of biliary tract: Secondary | ICD-10-CM

## 2019-02-21 DIAGNOSIS — J329 Chronic sinusitis, unspecified: Secondary | ICD-10-CM | POA: Diagnosis not present

## 2019-02-21 DIAGNOSIS — K2100 Gastro-esophageal reflux disease with esophagitis, without bleeding: Secondary | ICD-10-CM | POA: Diagnosis not present

## 2019-02-21 DIAGNOSIS — I447 Left bundle-branch block, unspecified: Secondary | ICD-10-CM | POA: Diagnosis not present

## 2019-02-21 DIAGNOSIS — R1314 Dysphagia, pharyngoesophageal phase: Secondary | ICD-10-CM | POA: Diagnosis not present

## 2019-02-21 DIAGNOSIS — Z880 Allergy status to penicillin: Secondary | ICD-10-CM | POA: Diagnosis not present

## 2019-02-21 DIAGNOSIS — Z96652 Presence of left artificial knee joint: Secondary | ICD-10-CM | POA: Diagnosis not present

## 2019-02-21 DIAGNOSIS — K222 Esophageal obstruction: Secondary | ICD-10-CM | POA: Diagnosis not present

## 2019-02-21 DIAGNOSIS — I10 Essential (primary) hypertension: Secondary | ICD-10-CM | POA: Diagnosis not present

## 2019-02-21 DIAGNOSIS — I341 Nonrheumatic mitral (valve) prolapse: Secondary | ICD-10-CM | POA: Diagnosis not present

## 2019-02-21 DIAGNOSIS — Z20828 Contact with and (suspected) exposure to other viral communicable diseases: Secondary | ICD-10-CM | POA: Diagnosis not present

## 2019-02-21 DIAGNOSIS — J45909 Unspecified asthma, uncomplicated: Secondary | ICD-10-CM | POA: Diagnosis not present

## 2019-02-21 DIAGNOSIS — R1310 Dysphagia, unspecified: Secondary | ICD-10-CM

## 2019-02-21 DIAGNOSIS — K3189 Other diseases of stomach and duodenum: Secondary | ICD-10-CM | POA: Diagnosis not present

## 2019-02-21 DIAGNOSIS — Z887 Allergy status to serum and vaccine status: Secondary | ICD-10-CM | POA: Diagnosis not present

## 2019-02-21 DIAGNOSIS — K219 Gastro-esophageal reflux disease without esophagitis: Secondary | ICD-10-CM

## 2019-02-21 DIAGNOSIS — Z882 Allergy status to sulfonamides status: Secondary | ICD-10-CM | POA: Diagnosis not present

## 2019-02-21 DIAGNOSIS — K449 Diaphragmatic hernia without obstruction or gangrene: Secondary | ICD-10-CM | POA: Diagnosis not present

## 2019-02-21 DIAGNOSIS — Z91041 Radiographic dye allergy status: Secondary | ICD-10-CM | POA: Diagnosis not present

## 2019-02-21 DIAGNOSIS — Z79899 Other long term (current) drug therapy: Secondary | ICD-10-CM | POA: Diagnosis not present

## 2019-02-21 DIAGNOSIS — M1990 Unspecified osteoarthritis, unspecified site: Secondary | ICD-10-CM | POA: Diagnosis not present

## 2019-02-21 LAB — HEPATIC FUNCTION PANEL
AG Ratio: 1.6 (calc) (ref 1.0–2.5)
ALT: 10 U/L (ref 6–29)
AST: 18 U/L (ref 10–35)
Albumin: 4.3 g/dL (ref 3.6–5.1)
Alkaline phosphatase (APISO): 80 U/L (ref 37–153)
Bilirubin, Direct: 0.1 mg/dL (ref 0.0–0.2)
Globulin: 2.7 g/dL (calc) (ref 1.9–3.7)
Indirect Bilirubin: 0.2 mg/dL (calc) (ref 0.2–1.2)
Total Bilirubin: 0.3 mg/dL (ref 0.2–1.2)
Total Protein: 7 g/dL (ref 6.1–8.1)

## 2019-02-21 LAB — SARS CORONAVIRUS 2 (TAT 6-24 HRS): SARS Coronavirus 2: NEGATIVE

## 2019-02-21 NOTE — Patient Instructions (Addendum)
Hepatic function profile today  EGD with dilation - GERD and Dysphagia -  Conscious sedation - within the next week  Further recommendations to follow

## 2019-02-21 NOTE — H&P (View-Only) (Signed)
Primary Care Physician:  Earney Mallet, MD Primary Gastroenterologist:  Dr.   Pre-Procedure History & Physical: HPI:  Rhonda Murphy is a 73 y.o. female here for diarrhea.  Lifelong baseline bowel function that of constipation.  Chronic GERD historically managed with ranitidine.  Started having trouble with reflux when she stopped taking ranitidine as it was taken off the market.   She has had diarrhea temporally associated to pretty much all the available PPIs except for Prilosec (intolerant to esomeprazole as well-diarrhea).  Nonbloody diarrhea.  When she stops taking a PPI bowel function normalizes.  Negative colonoscopy 1 year ago (prior to the onset of diarrhea).  Longstanding symptomatic GERD manifest as heartburn and regurgitation.  Also, significant sinus with issues regarding recurrent sinusitis-multiple sinus surgeries.  ENT feels strongly she has severe reflux predisposing her to this.  C. difficile toxin assay earlier negative earlier this year celiac panel negative.  Has been on multiple rounds of antibiotics.  Dilated biliary tree on CT status post distant cholecystectomy.  LFTs were okay previously except for alk phos of 127.  She really does not have any biliary type discomfort.  Occasionally, she has some left-sided discomfort more related with constipation historically but not now..  She also relates esophageal dysphagia to solid food like chicken.  Schatzki's ring dilated by me in 2011.  No follow-up EGD since that time.  Past Medical History:  Diagnosis Date  . Acid reflux   . Arthritis   . Asthma   . Asthma   . Back pain   . C. difficile colitis 2011  . Enteritis 03/21/2013  . History of kidney stones   . Hypertension   . Influenza A 03/24/2013  . LBBB (left bundle branch block) 03/21/2013  . Mitral valve prolapse   . Shingles   . Small bowel obstruction ([place]) 03/24/2013   SBO vs. enteritis.     Past Surgical History:  Procedure Laterality Date  . ABDOMINAL  HYSTERECTOMY    . APPENDECTOMY    . CATARACT EXTRACTION Bilateral   . CHOLECYSTECTOMY    . COLONOSCOPY  2010   Dr. Earley Brooke  . COLONOSCOPY WITH PROPOFOL N/A 12/02/2017   Dr. Gala Romney: diverticulosis.   Marland Kitchen ESOPHAGOGASTRODUODENOSCOPY  2011   Dr. Gala Romney: patent Schatzki's ring s/p dilation, esophageal biopsies negative, duodenal biopsies negative. Intolerant to PPIs in past and took H2 blockers  . EXPLORATORY LAPAROTOMY     in her 34s, removed appendix, hysterectomy, cholecystectomy, varicose veins in abdomen  . JOINT REPLACEMENT     Rt knee  . RENAL ARTERY STENT  2019   [place]  . TONSILLECTOMY    . 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 TOTAL KNEE REVISION Left 04/16/2015   Procedure: TOTAL KNEE REVISION;  Surgeon: Hessie Knows, MD;  Location: [facility];  Service: Orthopedics;  Laterality: Left;    Prior to Admission medications   Medication Sig Start Date End Date Taking? Authorizing Provider  acetaminophen (TYLENOL) 650 MG CR tablet Take 650 mg by mouth 2 (two) times daily as needed for pain.    Yes [provider]  amitriptyline (ELAVIL) 25 MG tablet Take 25 mg by mouth at bedtime.   Yes [provider]  amLODipine (NORVASC) 10 MG tablet Take 10 mg by mouth daily.   Yes [provider]  chlorthalidone (HYGROTON) 25 MG tablet Take 25 mg by  mouth daily.   Yes [provider]  cimetidine (TAGAMET) 400 MG tablet Take 1 tablet (400 mg total) by mouth 2 (two) times daily. 02/08/19  Yes Mahala Menghini, PA-C  cloNIDine (CATAPRES) 0.1 MG tablet Take 0.1 mg by mouth 2 (two) times daily. 11/10/18  Yes [provider]  dexlansoprazole (DEXILANT) 60 MG capsule Take 1 capsule (60 mg total) by mouth daily before breakfast. 01/31/19  Yes Mahala Menghini, PA-C  dicyclomine (BENTYL) 10 MG capsule Take 1 capsule (10 mg total) by mouth 3  (three) times daily as needed (for diarrhea). 02/13/19  Yes Mahala Menghini, PA-C  EPINEPHrine 0.3 mg/0.3 mL IJ SOAJ injection Inject 0.3 mg into the muscle daily as needed for anaphylaxis. 10/19/17  Yes [provider]  fluticasone furoate-vilanterol (BREO ELLIPTA) 100-25 MCG/INH AEPB Inhale 1 puff into the lungs daily.   Yes [provider]  losartan (COZAAR) 100 MG tablet Take 100 mg by mouth daily.   Yes [provider]  montelukast (SINGULAIR) 10 MG tablet Take 10 mg by mouth at bedtime.    Yes [provider]  Multiple Vitamin (MULTIVITAMIN WITH MINERALS) TABS tablet Take 1 tablet by mouth daily. Centrum silver   Yes [provider]  NON FORMULARY Inject 1 Dose as directed. Allergy shot every 3 weeks   Yes [provider]  saccharomyces boulardii (FLORASTOR) 250 MG capsule Take 250 mg by mouth 2 (two) times daily.   Yes [provider]  simvastatin (ZOCOR) 20 MG tablet Take 20 mg by mouth every evening. 11/19/17  Yes [provider]  spironolactone (ALDACTONE) 50 MG tablet Take 50 mg by mouth daily.   Yes [provider]    Allergies as of 02/21/2019 - Review Complete 02/21/2019  Allergen Reaction Noted  . Influenza vaccines Rash 03/21/2013  . Sulfa antibiotics Anaphylaxis 03/21/2013  . Augmentin [amoxicillin-pot clavulanate] Other (See Comments) 11/25/2017  . Esomeprazole magnesium Diarrhea   . Iodine  10/25/2014  . Lipitor [atorvastatin] Other (See Comments) 07/13/2014  . Norvasc [amlodipine besylate] Other (See Comments) 10/25/2014  . Omeprazole    . Other Other (See Comments) 07/13/2014  . Pantoprazole sodium Diarrhea   . Prevacid [lansoprazole] Diarrhea and Other (See Comments) 07/13/2014  . Rabeprazole sodium Diarrhea   . Spironolactone Other (See Comments) 07/13/2014  . Sulfonamide derivatives    . Terazosin Other (See Comments) 07/13/2014  . Tigan [trimethobenzamide hcl] Other (See Comments)  07/13/2014  . Zithromax [azithromycin] Other (See Comments) 07/13/2014    Family History  Problem Relation Age of Onset  . Breast cancer Mother 13  . Colon cancer Neg Hx   . Colon polyps Neg Hx     Social History   Socioeconomic History  . Marital status: Married    Spouse name: Not on file  . Number of children: Not on file  . Years of education: Not on file  . Highest education level: Not on file  Occupational History  . Not on file  Social Needs  . Financial resource strain: Not on file  . Food insecurity    Worry: Not on file    Inability: Not on file  . Transportation needs    Medical: Not on file    Non-medical: Not on file  Tobacco Use  . Smoking status: Never Smoker  . Smokeless tobacco: Never Used  Substance and Sexual Activity  . Alcohol use: Yes    Comment: rare wine  . Drug use: No  . Sexual activity:  Yes    Birth control/protection: Surgical  Lifestyle  . Physical activity    Days per week: Not on file    Minutes per session: Not on file  . Stress: Not on file  Relationships  . Social Herbalist on phone: Not on file    Gets together: Not on file    Attends religious service: Not on file    Active member of club or organization: Not on file    Attends meetings of clubs or organizations: Not on file    Relationship status: Not on file  . Intimate partner violence    Fear of current or ex partner: Not on file    Emotionally abused: Not on file    Physically abused: Not on file    Forced sexual activity: Not on file  Other Topics Concern  . Not on file  Social History Narrative  . Not on file    Review of Systems: See HPI, otherwise negative ROS  Physical Exam: BP 120/63   Pulse 86   Temp 97.6 F (36.4 C) (Oral)   Ht _0  (1.575 m)   Wt 148 lb 12.8 oz (67.5 kg)   BMI 27.22 kg/m  General:   Alert,  pleasant and cooperative in NAD SNeck:  Supple; no masses or thyromegaly. No significant cervical adenopathy. Lungs:  Clear  throughout to auscultation.   No wheezes, crackles, or rhonchi. No acute distress. Heart:  Regular rate and rhythm; no murmurs, clicks, rubs,  or gallops. Abdomen: Non-distended, normal bowel sounds.  Soft and nontender without appreciable mass or hepatosplenomegaly.  Pulses:  Normal pulses noted. Extremities:  Without clubbing or edema.  Impression/Plan: Very pleasant 73 year old lady with longstanding typical reflux symptoms and recurrent complicated sinusitis requiring multiple surgical procedures.  The 2 entities may be related.  She is pretty much been intolerant to the gamut of PPIs although she is not tried omeprazole.  States her daughter takes omeprazole and she would like to try that before saying she has failed to tolerate any of the PPIs..  That is very close to esomeprazole but certainly a reasonable approach.  It is worth also noting that C. difficile toxin assay on her stool as well as a celiac screen have come back negative.  Diarrhea strongly temporally related to PPI exposure.  Therefore, other entities such as microscopic colitis would be much, much less likely.  She has esophageal dysphagia; has been almost 10 years since she had an EGD.   I think we need to go ahead and do an EGD to see if she has reflux esophagitis, etc.  She may benefit from a dilation.  If she does not have reflux esophagitis, were probably looking at doing an ambulatory pH/impedance study to further characterize/quantify reflux.  Ultimately, if reflux is confirmed and she cannot tolerate PPI therapy, she may benefit from endoscopic/surgical procedure to bolster her antireflux barrier.  Recommendations:  We will proceed with an EGD with esophageal dilation as feasible/appropriate in the very near future.  We will do this prior to empiric trial of omeprazole.The risks, benefits, limitations, alternatives and imponderables have been reviewed with the patient. Potential for esophageal dilation, biopsy, etc.  have also been reviewed.  Questions have been answered. All parties agreeable.  Patient understands that if omeprazole is not effective/tolerated, further evaluation will be needed including an ambulatory esophageal pH/impedance study.     Notice: This dictation was prepared with Dragon dictation along with smaller phrase technology. Any  transcriptional errors that result from this process are unintentional and may not be corrected upon review.

## 2019-02-21 NOTE — Progress Notes (Signed)
Primary Care Physician:  Earney Mallet, MD Primary Gastroenterologist:  Dr.   Pre-Procedure History & Physical: HPI:  Rhonda Murphy is a 73 y.o. female here for diarrhea.  Lifelong baseline bowel function that of constipation.  Chronic GERD historically managed with ranitidine.  Started having trouble with reflux when she stopped taking ranitidine as it was taken off the market.   She has had diarrhea temporally associated to pretty much all the available PPIs except for Prilosec (intolerant to esomeprazole as well-diarrhea).  Nonbloody diarrhea.  When she stops taking a PPI bowel function normalizes.  Negative colonoscopy 1 year ago (prior to the onset of diarrhea).  Longstanding symptomatic GERD manifest as heartburn and regurgitation.  Also, significant sinus with issues regarding recurrent sinusitis-multiple sinus surgeries.  ENT feels strongly she has severe reflux predisposing her to this.  C. difficile toxin assay earlier negative earlier this year celiac panel negative.  Has been on multiple rounds of antibiotics.  Dilated biliary tree on CT status post distant cholecystectomy.  LFTs were okay previously except for alk phos of 127.  She really does not have any biliary type discomfort.  Occasionally, she has some left-sided discomfort more related with constipation historically but not now..  She also relates esophageal dysphagia to solid food like chicken.  Schatzki's ring dilated by me in 2011.  No follow-up EGD since that time.  Past Medical History:  Diagnosis Date  . Acid reflux   . Arthritis   . Asthma   . Asthma   . Back pain   . C. difficile colitis 2011  . Enteritis 03/21/2013  . History of kidney stones   . Hypertension   . Influenza A 03/24/2013  . LBBB (left bundle branch block) 03/21/2013  . Mitral valve prolapse   . Shingles   . Small bowel obstruction ([place]) 03/24/2013   SBO vs. enteritis.     Past Surgical History:  Procedure Laterality Date  . ABDOMINAL  HYSTERECTOMY    . APPENDECTOMY    . CATARACT EXTRACTION Bilateral   . CHOLECYSTECTOMY    . COLONOSCOPY  2010   Dr. Earley Brooke  . COLONOSCOPY WITH PROPOFOL N/A 12/02/2017   Dr. Gala Romney: diverticulosis.   Marland Kitchen ESOPHAGOGASTRODUODENOSCOPY  2011   Dr. Gala Romney: patent Schatzki's ring s/p dilation, esophageal biopsies negative, duodenal biopsies negative. Intolerant to PPIs in past and took H2 blockers  . EXPLORATORY LAPAROTOMY     in her 44s, removed appendix, hysterectomy, cholecystectomy, varicose veins in abdomen  . JOINT REPLACEMENT     Rt knee  . RENAL ARTERY STENT  2019   [place]  . TONSILLECTOMY    . 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 TOTAL KNEE REVISION Left 04/16/2015   Procedure: TOTAL KNEE REVISION;  Surgeon: Hessie Knows, MD;  Location: [facility];  Service: Orthopedics;  Laterality: Left;    Prior to Admission medications   Medication Sig Start Date End Date Taking? Authorizing Provider  acetaminophen (TYLENOL) 650 MG CR tablet Take 650 mg by mouth 2 (two) times daily as needed for pain.    Yes [provider]  amitriptyline (ELAVIL) 25 MG tablet Take 25 mg by mouth at bedtime.   Yes [provider]  amLODipine (NORVASC) 10 MG tablet Take 10 mg by mouth daily.   Yes [provider]  chlorthalidone (HYGROTON) 25 MG tablet Take 25 mg by  mouth daily.   Yes [provider]  cimetidine (TAGAMET) 400 MG tablet Take 1 tablet (400 mg total) by mouth 2 (two) times daily. 02/08/19  Yes Mahala Menghini, PA-C  cloNIDine (CATAPRES) 0.1 MG tablet Take 0.1 mg by mouth 2 (two) times daily. 11/10/18  Yes [provider]  dexlansoprazole (DEXILANT) 60 MG capsule Take 1 capsule (60 mg total) by mouth daily before breakfast. 01/31/19  Yes Mahala Menghini, PA-C  dicyclomine (BENTYL) 10 MG capsule Take 1 capsule (10 mg total) by mouth 3  (three) times daily as needed (for diarrhea). 02/13/19  Yes Mahala Menghini, PA-C  EPINEPHrine 0.3 mg/0.3 mL IJ SOAJ injection Inject 0.3 mg into the muscle daily as needed for anaphylaxis. 10/19/17  Yes [provider]  fluticasone furoate-vilanterol (BREO ELLIPTA) 100-25 MCG/INH AEPB Inhale 1 puff into the lungs daily.   Yes [provider]  losartan (COZAAR) 100 MG tablet Take 100 mg by mouth daily.   Yes [provider]  montelukast (SINGULAIR) 10 MG tablet Take 10 mg by mouth at bedtime.    Yes [provider]  Multiple Vitamin (MULTIVITAMIN WITH MINERALS) TABS tablet Take 1 tablet by mouth daily. Centrum silver   Yes [provider]  NON FORMULARY Inject 1 Dose as directed. Allergy shot every 3 weeks   Yes [provider]  saccharomyces boulardii (FLORASTOR) 250 MG capsule Take 250 mg by mouth 2 (two) times daily.   Yes [provider]  simvastatin (ZOCOR) 20 MG tablet Take 20 mg by mouth every evening. 11/19/17  Yes [provider]  spironolactone (ALDACTONE) 50 MG tablet Take 50 mg by mouth daily.   Yes [provider]    Allergies as of 02/21/2019 - Review Complete 02/21/2019  Allergen Reaction Noted  . Influenza vaccines Rash 03/21/2013  . Sulfa antibiotics Anaphylaxis 03/21/2013  . Augmentin [amoxicillin-pot clavulanate] Other (See Comments) 11/25/2017  . Esomeprazole magnesium Diarrhea   . Iodine  10/25/2014  . Lipitor [atorvastatin] Other (See Comments) 07/13/2014  . Norvasc [amlodipine besylate] Other (See Comments) 10/25/2014  . Omeprazole    . Other Other (See Comments) 07/13/2014  . Pantoprazole sodium Diarrhea   . Prevacid [lansoprazole] Diarrhea and Other (See Comments) 07/13/2014  . Rabeprazole sodium Diarrhea   . Spironolactone Other (See Comments) 07/13/2014  . Sulfonamide derivatives    . Terazosin Other (See Comments) 07/13/2014  . Tigan [trimethobenzamide hcl] Other (See Comments)  07/13/2014  . Zithromax [azithromycin] Other (See Comments) 07/13/2014    Family History  Problem Relation Age of Onset  . Breast cancer Mother 41  . Colon cancer Neg Hx   . Colon polyps Neg Hx     Social History   Socioeconomic History  . Marital status: Married    Spouse name: Not on file  . Number of children: Not on file  . Years of education: Not on file  . Highest education level: Not on file  Occupational History  . Not on file  Social Needs  . Financial resource strain: Not on file  . Food insecurity    Worry: Not on file    Inability: Not on file  . Transportation needs    Medical: Not on file    Non-medical: Not on file  Tobacco Use  . Smoking status: Never Smoker  . Smokeless tobacco: Never Used  Substance and Sexual Activity  . Alcohol use: Yes    Comment: rare wine  . Drug use: No  . Sexual activity:  Yes    Birth control/protection: Surgical  Lifestyle  . Physical activity    Days per week: Not on file    Minutes per session: Not on file  . Stress: Not on file  Relationships  . Social Herbalist on phone: Not on file    Gets together: Not on file    Attends religious service: Not on file    Active member of club or organization: Not on file    Attends meetings of clubs or organizations: Not on file    Relationship status: Not on file  . Intimate partner violence    Fear of current or ex partner: Not on file    Emotionally abused: Not on file    Physically abused: Not on file    Forced sexual activity: Not on file  Other Topics Concern  . Not on file  Social History Narrative  . Not on file    Review of Systems: See HPI, otherwise negative ROS  Physical Exam: BP 120/63   Pulse 86   Temp 97.6 F (36.4 C) (Oral)   Ht _0  (1.575 m)   Wt 148 lb 12.8 oz (67.5 kg)   BMI 27.22 kg/m  General:   Alert,  pleasant and cooperative in NAD SNeck:  Supple; no masses or thyromegaly. No significant cervical adenopathy. Lungs:  Clear  throughout to auscultation.   No wheezes, crackles, or rhonchi. No acute distress. Heart:  Regular rate and rhythm; no murmurs, clicks, rubs,  or gallops. Abdomen: Non-distended, normal bowel sounds.  Soft and nontender without appreciable mass or hepatosplenomegaly.  Pulses:  Normal pulses noted. Extremities:  Without clubbing or edema.  Impression/Plan: Very pleasant 73 year old lady with longstanding typical reflux symptoms and recurrent complicated sinusitis requiring multiple surgical procedures.  The 2 entities may be related.  She is pretty much been intolerant to the gamut of PPIs although she is not tried omeprazole.  States her daughter takes omeprazole and she would like to try that before saying she has failed to tolerate any of the PPIs..  That is very close to esomeprazole but certainly a reasonable approach.  It is worth also noting that C. difficile toxin assay on her stool as well as a celiac screen have come back negative.  Diarrhea strongly temporally related to PPI exposure.  Therefore, other entities such as microscopic colitis would be much, much less likely.  She has esophageal dysphagia; has been almost 10 years since she had an EGD.   I think we need to go ahead and do an EGD to see if she has reflux esophagitis, etc.  She may benefit from a dilation.  If she does not have reflux esophagitis, were probably looking at doing an ambulatory pH/impedance study to further characterize/quantify reflux.  Ultimately, if reflux is confirmed and she cannot tolerate PPI therapy, she may benefit from endoscopic/surgical procedure to bolster her antireflux barrier.  Recommendations:  We will proceed with an EGD with esophageal dilation as feasible/appropriate in the very near future.  We will do this prior to empiric trial of omeprazole.The risks, benefits, limitations, alternatives and imponderables have been reviewed with the patient. Potential for esophageal dilation, biopsy, etc.  have also been reviewed.  Questions have been answered. All parties agreeable.  Patient understands that if omeprazole is not effective/tolerated, further evaluation will be needed including an ambulatory esophageal pH/impedance study.     Notice: This dictation was prepared with Dragon dictation along with smaller phrase technology. Any  transcriptional errors that result from this process are unintentional and may not be corrected upon review.

## 2019-02-22 ENCOUNTER — Other Ambulatory Visit: Payer: Self-pay

## 2019-02-22 ENCOUNTER — Encounter (HOSPITAL_COMMUNITY): Payer: Self-pay | Admitting: *Deleted

## 2019-02-22 ENCOUNTER — Encounter (HOSPITAL_COMMUNITY)
Admission: RE | Disposition: A | Discharge status: Home / Self Care | Payer: Self-pay | Source: Ambulatory Visit | Attending: Internal Medicine

## 2019-02-22 ENCOUNTER — Ambulatory Visit (HOSPITAL_COMMUNITY)
Admission: RE | Admit: 2019-02-22 | Discharge: 2019-02-22 | Disposition: A | Discharge status: Home / Self Care | Payer: Medicare Other | Source: Ambulatory Visit | Attending: Internal Medicine | Admitting: Internal Medicine

## 2019-02-22 DIAGNOSIS — K219 Gastro-esophageal reflux disease without esophagitis: Secondary | ICD-10-CM

## 2019-02-22 DIAGNOSIS — Z96652 Presence of left artificial knee joint: Secondary | ICD-10-CM | POA: Insufficient documentation

## 2019-02-22 DIAGNOSIS — K3189 Other diseases of stomach and duodenum: Secondary | ICD-10-CM | POA: Diagnosis not present

## 2019-02-22 DIAGNOSIS — K222 Esophageal obstruction: Secondary | ICD-10-CM | POA: Insufficient documentation

## 2019-02-22 DIAGNOSIS — R1310 Dysphagia, unspecified: Secondary | ICD-10-CM

## 2019-02-22 DIAGNOSIS — K2100 Gastro-esophageal reflux disease with esophagitis, without bleeding: Secondary | ICD-10-CM | POA: Insufficient documentation

## 2019-02-22 DIAGNOSIS — J45909 Unspecified asthma, uncomplicated: Secondary | ICD-10-CM | POA: Insufficient documentation

## 2019-02-22 DIAGNOSIS — I10 Essential (primary) hypertension: Secondary | ICD-10-CM | POA: Insufficient documentation

## 2019-02-22 DIAGNOSIS — Z79899 Other long term (current) drug therapy: Secondary | ICD-10-CM | POA: Insufficient documentation

## 2019-02-22 DIAGNOSIS — Z91041 Radiographic dye allergy status: Secondary | ICD-10-CM | POA: Insufficient documentation

## 2019-02-22 DIAGNOSIS — Z882 Allergy status to sulfonamides status: Secondary | ICD-10-CM | POA: Insufficient documentation

## 2019-02-22 DIAGNOSIS — Z880 Allergy status to penicillin: Secondary | ICD-10-CM | POA: Insufficient documentation

## 2019-02-22 DIAGNOSIS — Z20828 Contact with and (suspected) exposure to other viral communicable diseases: Secondary | ICD-10-CM | POA: Insufficient documentation

## 2019-02-22 DIAGNOSIS — I447 Left bundle-branch block, unspecified: Secondary | ICD-10-CM | POA: Insufficient documentation

## 2019-02-22 DIAGNOSIS — R1314 Dysphagia, pharyngoesophageal phase: Secondary | ICD-10-CM | POA: Insufficient documentation

## 2019-02-22 DIAGNOSIS — K209 Esophagitis, unspecified without bleeding: Secondary | ICD-10-CM | POA: Diagnosis not present

## 2019-02-22 DIAGNOSIS — J329 Chronic sinusitis, unspecified: Secondary | ICD-10-CM | POA: Insufficient documentation

## 2019-02-22 DIAGNOSIS — K449 Diaphragmatic hernia without obstruction or gangrene: Secondary | ICD-10-CM | POA: Insufficient documentation

## 2019-02-22 DIAGNOSIS — Z887 Allergy status to serum and vaccine status: Secondary | ICD-10-CM | POA: Insufficient documentation

## 2019-02-22 DIAGNOSIS — M1990 Unspecified osteoarthritis, unspecified site: Secondary | ICD-10-CM | POA: Insufficient documentation

## 2019-02-22 DIAGNOSIS — R197 Diarrhea, unspecified: Secondary | ICD-10-CM | POA: Insufficient documentation

## 2019-02-22 DIAGNOSIS — I341 Nonrheumatic mitral (valve) prolapse: Secondary | ICD-10-CM | POA: Insufficient documentation

## 2019-02-22 DIAGNOSIS — Z888 Allergy status to other drugs, medicaments and biological substances status: Secondary | ICD-10-CM | POA: Insufficient documentation

## 2019-02-22 HISTORY — PX: ESOPHAGOGASTRODUODENOSCOPY: SHX5428

## 2019-02-22 HISTORY — PX: BIOPSY: SHX5522

## 2019-02-22 HISTORY — PX: MALONEY DILATION: SHX5535

## 2019-02-22 SURGERY — EGD (ESOPHAGOGASTRODUODENOSCOPY)
Anesthesia: Moderate Sedation

## 2019-02-22 MED ORDER — LIDOCAINE VISCOUS HCL 2 % MT SOLN
OROMUCOSAL | Status: DC | PRN
  Administered 2019-02-22: 1 via OROMUCOSAL

## 2019-02-22 MED ORDER — MIDAZOLAM HCL 5 MG/5ML IJ SOLN
INTRAMUSCULAR | Status: AC
  Filled 2019-02-22: qty 10

## 2019-02-22 MED ORDER — ONDANSETRON HCL 4 MG/2ML IJ SOLN
INTRAMUSCULAR | Status: AC
  Filled 2019-02-22: qty 2

## 2019-02-22 MED ORDER — MEPERIDINE HCL 50 MG/ML IJ SOLN
INTRAMUSCULAR | Status: AC
  Filled 2019-02-22: qty 1

## 2019-02-22 MED ORDER — MEPERIDINE HCL 100 MG/ML IJ SOLN
INTRAMUSCULAR | Status: DC | PRN
  Administered 2019-02-22: 10 mg
  Administered 2019-02-22: 15 mg
  Administered 2019-02-22: 25 mg

## 2019-02-22 MED ORDER — SODIUM CHLORIDE 0.9 % IV SOLN
INTRAVENOUS | Status: DC
  Administered 2019-02-22: 1000 mL via INTRAVENOUS

## 2019-02-22 MED ORDER — ONDANSETRON HCL 4 MG/2ML IJ SOLN
INTRAMUSCULAR | Status: DC | PRN
  Administered 2019-02-22: 4 mg via INTRAVENOUS

## 2019-02-22 MED ORDER — MIDAZOLAM HCL 5 MG/5ML IJ SOLN
INTRAMUSCULAR | Status: DC | PRN
  Administered 2019-02-22: 2 mg via INTRAVENOUS
  Administered 2019-02-22 (×3): 1 mg via INTRAVENOUS

## 2019-02-22 MED ORDER — LIDOCAINE VISCOUS HCL 2 % MT SOLN
OROMUCOSAL | Status: AC
  Filled 2019-02-22: qty 15

## 2019-02-22 NOTE — Interval H&P Note (Signed)
History and Physical Interval Note:  02/22/2019 1:37 PM  Rhonda Murphy  has presented today for surgery, with the diagnosis of GERD, dysphagia.  The various methods of treatment have been discussed with the patient and family. After consideration of risks, benefits and other options for treatment, the patient has consented to  Procedure(s) with comments: ESOPHAGOGASTRODUODENOSCOPY (EGD) (N/A) - 2:15pm MALONEY DILATION (N/A) as a surgical intervention.  The patient's history has been reviewed, patient examined, no change in status, stable for surgery.  I have reviewed the patient's chart and labs.  Questions were answered to the patient's satisfaction.     Rhonda Murphy  No change in symptoms since seen yesterday.  EGD with possible ED today per plan.  The risks, benefits, limitations, alternatives and imponderables have been reviewed with the patient. Potential for esophageal dilation, biopsy, etc. have also been reviewed.  Questions have been answered. All parties agreeable.

## 2019-02-22 NOTE — Op Note (Signed)
[facility] Patient Name: Rhonda Murphy Procedure Date: 02/22/2019 1:34 PM MRN: [id number] Date of Birth: Aug 22, 1945 Attending MD: Gennette Pac , MD CSN: [id number] Age: 73 Admit Type: Outpatient Procedure:                Upper GI endoscopy Indications:              Dysphagia, Heartburn Providers:                Gennette Pac, MD, Jannett Celestine, RN, Edythe Clarity, Technician Referring MD:              Medicines:                Midazolam 5 mg IV, Meperidine 50 mg IV, Ondansetron                            4 mg IV Complications:            No immediate complications. Estimated Blood Loss:     Estimated blood loss was minimal. Procedure:                Pre-Anesthesia Assessment:                           - Prior to the procedure, a History and Physical                            was performed, and patient medications and                            allergies were reviewed. The patient's tolerance of                            previous anesthesia was also reviewed. The risks                            and benefits of the procedure and the sedation                            options and risks were discussed with the patient.                            All questions were answered, and informed consent                            was obtained. Prior Anticoagulants: The patient has                            taken no previous anticoagulant or antiplatelet                            agents. ASA Grade Assessment: II - A patient with  mild systemic disease. After reviewing the risks                            and benefits, the patient was deemed in                            satisfactory condition to undergo the procedure.                           After obtaining informed consent, the endoscope was                            passed under direct vision. Throughout the                            procedure, the patient's blood  pressure, pulse, and                            oxygen saturations were monitored continuously. The                            GIF-H190 ([id number]) scope was introduced through the                            mouth, and advanced to the second part of duodenum.                            The upper GI endoscopy was accomplished without                            difficulty. The patient tolerated the procedure                            well. The upper GI endoscopy was accomplished                            without difficulty. The patient tolerated the                            procedure well. Scope In: 1:49:13 PM Scope Out: 2:01:24 PM Total Procedure Duration: 0 hours 12 minutes 11 seconds  Findings:      Esophagitis was found. Multiple circumferential erosions straddling a       prominent Schatzki's ring at the GE junction. No tumor. No Barrett's       epithelium seen The scope was withdrawn. Dilation was performed with a       Maloney dilator with mild resistance at 54 Fr. The dilation site was       examined following endoscope reinsertion and showed no change. Estimated       blood loss: none. Subsequently, four-quadrant "bites" of the ring were       taken to disrupted with a cold biopsy forceps. This was done effectively       and without apparent complication      A medium-sized hiatal hernia was present.  Scattered gastric erosions seen. No ulcer or infiltrating process.       Pylorus patent. Finally, the abnormal gastric mucosa was biopsied with a       cold forceps for histology. Estimated blood loss was minimal.      The duodenal bulb and second portion of the duodenum were normal. Impression:               -Erosive reflux esophagitis. Schatzki's ring                            dilated/disrupted as described above.                           - Medium-sized hiatal hernia.                           - Erosive gastropathy - Biopsied.                           - Normal duodenal  bulb and second portion of the                            duodenum. Moderate Sedation:      Moderate (conscious) sedation was administered by the endoscopy nurse       and supervised by the endoscopist. The following parameters were       monitored: oxygen saturation, heart rate, blood pressure, respiratory       rate, EKG, adequacy of pulmonary ventilation, and response to care.       Total physician intraservice time was 21 minutes. Recommendation:           - Patient has a contact number available for                            emergencies. The signs and symptoms of potential                            delayed complications were discussed with the                            patient. Return to normal activities tomorrow.                            Written discharge instructions were provided to the                            patient.                           - Advance diet as tolerated.                           - Continue present medications. Stop Dexilant.                            Trial of omeprazole 40 mg daily (patient previously  experienced diarrhea with Nexium. She tells me her                            daughter does well on omeprazole. She wants to try                            the remaining PPI on the market which she has not                            tried previously - she want to try omeprazole. I                            pointed out that omeprazole is extremely close to                            esomeprazole biochemically. However, I think it                            would be worth a try. If she can tolerate it that                            will be the way to go.                           - Return to GI office in 6 weeks. I will follow-up                            on pathology. Procedure Code(s):        --- Professional ---                           [phone], Esophagogastroduodenoscopy, flexible,                            transoral;  with biopsy, single or multiple                           43450, Dilation of esophagus, by unguided sound or                            bougie, single or multiple passes                           G0500, Moderate sedation services provided by the                            same physician or other qualified health care                            professional performing a gastrointestinal                            endoscopic service that sedation supports,  requiring the presence of an independent trained                            observer to assist in the monitoring of the                            patient's level of consciousness and physiological                            status; initial 15 minutes of intra-service time;                            patient age 53 years or older (additional time may                            be reported with 96295, as appropriate) Diagnosis Code(s):        --- Professional ---                           K20.90, Esophagitis, unspecified without bleeding                           K44.9, Diaphragmatic hernia without obstruction or                            gangrene                           K31.89, Other diseases of stomach and duodenum                           R13.10, Dysphagia, unspecified                           R12, Heartburn CPT copyright 2019 [employer] Association. All rights reserved. The codes documented in this report are preliminary and upon coder review may  be revised to meet current compliance requirements. Gerrit Friends. Nakoa Ganus, MD Gennette Pac, MD 02/22/2019 2:18:14 PM This report has been signed electronically. Number of Addenda: 0

## 2019-02-22 NOTE — Discharge Instructions (Signed)
EGD Discharge instructions Please read the instructions outlined below and refer to this sheet in the next few weeks. These discharge instructions provide you with general information on caring for yourself after you leave the hospital. Your doctor may also give you specific instructions. While your treatment has been planned according to the most current medical practices available, unavoidable complications occasionally occur. If you have any problems or questions after discharge, please call your doctor. ACTIVITY  You may resume your regular activity but move at a slower pace for the next 24 hours.   Take frequent rest periods for the next 24 hours.   Walking will help expel (get rid of) the air and reduce the bloated feeling in your abdomen.   No driving for 24 hours (because of the anesthesia (medicine) used during the test).   You may shower.   Do not sign any important legal documents or operate any machinery for 24 hours (because of the anesthesia used during the test).  NUTRITION  Drink plenty of fluids.   You may resume your normal diet.   Begin with a light meal and progress to your normal diet.   Avoid alcoholic beverages for 24 hours or as instructed by your caregiver.  MEDICATIONS  You may resume your normal medications unless your caregiver tells you otherwise.  WHAT YOU CAN EXPECT TODAY  You may experience abdominal discomfort such as a feeling of fullness or gas pains.  FOLLOW-UP  Your doctor will discuss the results of your test with you.   SEEK IMMEDIATE MEDICAL ATTENTION IF ANY OF THE FOLLOWING OCCUR:  Excessive nausea (feeling sick to your stomach) and/or vomiting.   Severe abdominal pain and distention (swelling).   Trouble swallowing.   Temperature over 101 F (37.8 C).   Rectal bleeding or vomiting of blood.    GERD information provided  You have reflux esophagitis.  Acid burns I can see in your esophagus.  As discussed in the office, we  will try a course of omeprazole 40 mg daily-prescription provided.  This is similar to some of the medications you have already taken.  It may or may not cause diarrhea.  Office visit with [place] in 6 weeks  Further recommendations to follow pending review of pathology report  At patient's request, I spoke to her husband at [phone] regarding findings and recommendations  PATIENT INSTRUCTIONS POST-ANESTHESIA  IMMEDIATELY FOLLOWING SURGERY:  Do not drive or operate machinery for the first twenty four hours after surgery.  Do not make any important decisions for twenty four hours after surgery or while taking narcotic pain medications or sedatives.  If you develop intractable nausea and vomiting or a severe headache please notify your doctor immediately.  FOLLOW-UP:  Please make an appointment with your surgeon as instructed. You do not need to follow up with anesthesia unless specifically instructed to do so.  WOUND CARE INSTRUCTIONS (if applicable):  Keep a dry clean dressing on the anesthesia/puncture wound site if there is drainage.  Once the wound has quit draining you may leave it open to air.  Generally you should leave the bandage intact for twenty four hours unless there is drainage.  If the epidural site drains for more than 36-48 hours please call the anesthesia department.  QUESTIONS?:  Please feel free to call your physician or the hospital operator if you have any questions, and they will be happy to assist you.       Gastroesophageal Reflux Disease, Adult Gastroesophageal reflux (GER) happens when  acid from the stomach flows up into the tube that connects the mouth and the stomach (esophagus). Normally, food travels down the esophagus and stays in the stomach to be digested. With GER, food and stomach acid sometimes move back up into the esophagus. You may have a disease called gastroesophageal reflux disease (GERD) if the reflux:  Happens often.  Causes frequent or very bad  symptoms.  Causes problems such as damage to the esophagus. When this happens, the esophagus becomes sore and swollen (inflamed). Over time, GERD can make small holes (ulcers) in the lining of the esophagus. What are the causes? This condition is caused by a problem with the muscle between the esophagus and the stomach. When this muscle is weak or not normal, it does not close properly to keep food and acid from coming back up from the stomach. The muscle can be weak because of:  Tobacco use.  Pregnancy.  Having a certain type of hernia (hiatal hernia).  Alcohol use.  Certain foods and drinks, such as coffee, chocolate, onions, and peppermint. What increases the risk? You are more likely to develop this condition if you:  Are overweight.  Have a disease that affects your connective tissue.  Use NSAID medicines. What are the signs or symptoms? Symptoms of this condition include:  Heartburn.  Difficult or painful swallowing.  The feeling of having a lump in the throat.  A bitter taste in the mouth.  Bad breath.  Having a lot of saliva.  Having an upset or bloated stomach.  Belching.  Chest pain. Different conditions can cause chest pain. Make sure you see your doctor if you have chest pain.  Shortness of breath or noisy breathing (wheezing).  Ongoing (chronic) cough or a cough at night.  Wearing away of the surface of teeth (tooth enamel).  Weight loss. How is this treated? Treatment will depend on how bad your symptoms are. Your doctor may suggest:  Changes to your diet.  Medicine.  Surgery. Follow these instructions at home: Eating and drinking   Follow a diet as told by your doctor. You may need to avoid foods and drinks such as: ? Coffee and tea (with or without caffeine). ? Drinks that contain alcohol. ? Energy drinks and sports drinks. ? Bubbly (carbonated) drinks or sodas. ? Chocolate and cocoa. ? Peppermint and mint flavorings. ? Garlic and  onions. ? Horseradish. ? Spicy and acidic foods. These include peppers, chili powder, curry powder, vinegar, hot sauces, and BBQ sauce. ? Citrus fruit juices and citrus fruits, such as oranges, lemons, and limes. ? Tomato-based foods. These include red sauce, chili, salsa, and pizza with red sauce. ? Fried and fatty foods. These include donuts, french fries, potato chips, and high-fat dressings. ? High-fat meats. These include hot dogs, rib eye steak, sausage, ham, and bacon. ? High-fat dairy items, such as whole milk, butter, and cream cheese.  Eat small meals often. Avoid eating large meals.  Avoid drinking large amounts of liquid with your meals.  Avoid eating meals during the 2-3 hours before bedtime.  Avoid lying down right after you eat.  Do not exercise right after you eat. Lifestyle   Do not use any products that contain nicotine or tobacco. These include cigarettes, e-cigarettes, and chewing tobacco. If you need help quitting, ask your doctor.  Try to lower your stress. If you need help doing this, ask your doctor.  If you are overweight, lose an amount of weight that is healthy for you. Ask  your doctor about a safe weight loss goal. General instructions  Pay attention to any changes in your symptoms.  Take over-the-counter and prescription medicines only as told by your doctor. Do not take aspirin, ibuprofen, or other NSAIDs unless your doctor says it is okay.  Wear loose clothes. Do not wear anything tight around your waist.  Raise (elevate) the head of your bed about 6 inches (15 cm).  Avoid bending over if this makes your symptoms worse.  Keep all follow-up visits as told by your doctor. This is important. Contact a doctor if:  You have new symptoms.  You lose weight and you do not know why.  You have trouble swallowing or it hurts to swallow.  You have wheezing or a cough that keeps happening.  Your symptoms do not get better with treatment.  You have  a hoarse voice. Get help right away if:  You have pain in your arms, neck, jaw, teeth, or back.  You feel sweaty, dizzy, or light-headed.  You have chest pain or shortness of breath.  You throw up (vomit) and your throw-up looks like blood or coffee grounds.  You pass out (faint).  Your poop (stool) is bloody or black.  You cannot swallow, drink, or eat. Summary  If a person has gastroesophageal reflux disease (GERD), food and stomach acid move back up into the esophagus and cause symptoms or problems such as damage to the esophagus.  Treatment will depend on how bad your symptoms are.  Follow a diet as told by your doctor.  Take all medicines only as told by your doctor. This information is not intended to replace advice given to you by your health care provider. Make sure you discuss any questions you have with your health care provider. Document Released: 08/19/2007 Document Revised: 09/08/2017 Document Reviewed: 09/08/2017 Elsevier Patient Education  2020 [employer].

## 2019-02-23 ENCOUNTER — Other Ambulatory Visit: Payer: Self-pay

## 2019-02-23 LAB — SURGICAL PATHOLOGY

## 2019-02-25 ENCOUNTER — Encounter: Payer: Self-pay | Admitting: Internal Medicine

## 2019-03-12 NOTE — Progress Notes (Signed)
Referring Provider: Alinda DeemJannach, Stephen, MD Primary Care Physician:  Alinda DeemJannach, Stephen, MD Primary GI Physician: Dr. Jena Gaussourk  Chief Complaint  Patient presents with  . Gastroesophageal Reflux    wants to discuss options    HPI:   Rhonda Murphy is a 73 y.o. female presenting today for follow-up of reflux.  GI history significant for chronic GERD with recurrent complicated sinusitis requiring multiple surgical procedures (these 2 entities may be related), chronic constipation but found to have nonbloody diarrhea associated with PPIs.  C. difficile and GI pathogen panel negative.  As diarrhea resolved after discontinuing PPIs, evaluation for microscopic colitis not pursued.  Last colonoscopy September 2019 with diverticulosis.  Also with history of dysphagia requiring esophageal dilations and left upper quadrant pain for which CT abdomen and pelvis pursued in November 2020 which found biliary ductal dilation in the setting of cholecystectomy, tiny left renal calculus versus vascular calcification.  Recheck of LFTs were normal.  No MRI/MRCP pursued.  She was last seen in our office on 02/21/2019.  Continue to have GERD symptoms manifesting as heartburn and regurgitation.  Requesting to try Prilosec as this is 1 PPI she had not tried.  Also with esophageal dysphagia to solid foods like chicken.  Plans to pursue EGD with possible dilation.  If no reflux esophagitis, probably pursue ambulatory pH/impedance study to further characterize reflux.  Ultimately, may benefit from endoscopic/surgical procedure if reflux is confirmed.  EGD 02/22/2019: Erosive reflux esophagitis.  Schatzki's ring dilated.  Medium size hiatal hernia.  Erosive gastropathy s/p biopsied.  Normal duodenal bulb and second portion of the duodenum.  Pathology with reactive gastropathy, no H. pylori.  Plans to trial omeprazole 40 mg daily.  Today: Dilation improved swallowing of solid foods. She does note some trouble with pills sticking in  her throat at times. Pills go down with drinking something. Not drinking anything before taking medications. Continues with reflux daily. States she can just hold her head over and will have acid come up.  Tried omeprazole and this also caused significant diarrhea. Stopped omeprazole the Monday before Christmas. Yesterday and today is the first day she hasn't had diarrhea. When on PPIs, states they do not help the reflux symptoms. States she has tried Pepcid and Tagamet in the past as well which did not help symptoms.  Pepcid caused diarrhea as well.  Currently taking Gaviscon daily. Waking up with nausea. This is chronic. Reports one episode of vomiting on 12/23 after eating spicy Brussels sprout slaw.  Intermittent hoarseness.   Admits to chronic intermittent epigastric pain. No worsening. Also with intermittent lower abdominal discomfort and growling before diarrhea.  This has improved as diarrhea has improved.  Currently without any significant lower abdominal pain.  No blood in the stool. No black stools.   No regular soda consumption.  Will drink sprite or ginger ale to relieve indigestion at times.  Has limited her dietary intake due to significant reflux.  History of constipation.  Historically has been taking Linzess 145 mcg as needed prior to the onset of diarrhea in the setting of PPIs.  She is interested in pursuing antireflux surgery.     Past Medical History:  Diagnosis Date  . Acid reflux   . Arthritis   . Asthma   . Asthma   . Back pain   . C. difficile colitis 2011  . Enteritis 03/21/2013  . History of kidney stones   . Hypertension   . Influenza A 03/24/2013  . LBBB (left  bundle branch block) 03/21/2013  . Mitral valve prolapse   . Shingles   . Small bowel obstruction (HCC) 03/24/2013   SBO vs. enteritis.     Past Surgical History:  Procedure Laterality Date  . ABDOMINAL HYSTERECTOMY    . APPENDECTOMY    . BIOPSY  02/22/2019   Procedure: BIOPSY;  Surgeon: Corbin Ade,  MD;  Location: [facility];  Service: Endoscopy;;  . CATARACT EXTRACTION Bilateral   . CHOLECYSTECTOMY    . COLONOSCOPY  2010   Dr. Samuella Cota  . COLONOSCOPY WITH PROPOFOL N/A 12/02/2017   Dr. Jena Gauss: diverticulosis.   Marland Kitchen ESOPHAGOGASTRODUODENOSCOPY  2011   Dr. Jena Gauss: patent Schatzki's ring s/p dilation, esophageal biopsies negative, duodenal biopsies negative. Intolerant to PPIs in past and took H2 blockers  . ESOPHAGOGASTRODUODENOSCOPY N/A 02/22/2019   Procedure: ESOPHAGOGASTRODUODENOSCOPY (EGD);  Surgeon: Corbin Ade, MD;  Location: [facility];  Service: Endoscopy;  Laterality: N/A;  2:15pm  . EXPLORATORY LAPAROTOMY     in her 30s, removed appendix, hysterectomy, cholecystectomy, varicose veins in abdomen  . JOINT REPLACEMENT     Rt knee  . MALONEY DILATION N/A 02/22/2019   Procedure: Elease Hashimoto DILATION;  Surgeon: Corbin Ade, MD;  Location: [facility];  Service: Endoscopy;  Laterality: N/A;  . RENAL ARTERY STENT  2019   [place]  . sinus surgeries     x 2 (11/2017; 10/2018). [facility] at [facility] and [facility] at [facility]  . TONSILLECTOMY    . 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 TOTAL KNEE REVISION Left 04/16/2015   Procedure: TOTAL KNEE REVISION;  Surgeon: Kennedy Bucker, MD;  Location: [facility];  Service: Orthopedics;  Laterality: Left;    Current Outpatient Medications  Medication Sig Dispense Refill  . acetaminophen (TYLENOL) 650 MG CR tablet Take 1,300 mg by mouth 2 (two) times daily as needed for pain.     Marland Kitchen albuterol (VENTOLIN HFA) 108 (90 Base) MCG/ACT inhaler Inhale 2 puffs into the lungs every 6 (six) hours as needed for wheezing or shortness of breath.    . Alum Hydroxide-Mag Trisilicate (GAVISCON) 80-14.2 MG CHEW Chew 1-2 tablets by mouth daily as needed (indigestion).    Marland Kitchen aluminum hydroxide-magnesium carbonate (GAVISCON) 95-358 MG/15ML  SUSP Take 15 mLs by mouth daily as needed for indigestion.    Marland Kitchen amitriptyline (ELAVIL) 10 MG tablet Take 10 mg by mouth at bedtime.    Marland Kitchen amLODipine (NORVASC) 10 MG tablet Take 10 mg by mouth at bedtime.     Marland Kitchen aspirin-acetaminophen-caffeine (EXCEDRIN MIGRAINE) 250-250-65 MG tablet Take 1-2 tablets by mouth every 6 (six) hours as needed for headache.    . cetirizine (ZYRTEC) 10 MG tablet Take 10 mg by mouth daily as needed for allergies.    . chlorthalidone (HYGROTON) 25 MG tablet Take 25 mg by mouth daily.    . cloNIDine (CATAPRES) 0.1 MG tablet Take 0.1 mg by mouth 2 (two) times daily.    Marland Kitchen dicyclomine (BENTYL) 10 MG capsule Take 1 capsule (10 mg total) by mouth 3 (three) times daily as needed (for diarrhea). 90 capsule 1  . EPINEPHrine 0.3 mg/0.3 mL IJ SOAJ injection Inject 0.3 mg into the muscle daily as needed for anaphylaxis.    . fluticasone furoate-vilanterol (BREO ELLIPTA) 100-25 MCG/INH AEPB Inhale 1 puff into the lungs daily.    Marland Kitchen linaclotide (LINZESS) 145 MCG CAPS  capsule Take 145 mcg by mouth daily before breakfast. Taking as needed.    Marland Kitchen losartan (COZAAR) 100 MG tablet Take 100 mg by mouth daily.    . montelukast (SINGULAIR) 10 MG tablet Take 10 mg by mouth every evening.     . Multiple Vitamin (MULTIVITAMIN WITH MINERALS) TABS tablet Take 1 tablet by mouth daily. Centrum silver    . NON FORMULARY Inject 1 Dose as directed See admin instructions. Allergy shot every 4 weeks    . saccharomyces boulardii (FLORASTOR) 250 MG capsule Take 250 mg by mouth daily.     . simvastatin (ZOCOR) 20 MG tablet Take 20 mg by mouth every evening.  4  . spironolactone (ALDACTONE) 50 MG tablet Take 50 mg by mouth daily.     No current facility-administered medications for this visit.    Allergies as of 03/13/2019 - Review Complete 03/13/2019  Allergen Reaction Noted  . Influenza vaccines Rash 03/21/2013  . Sulfa antibiotics Anaphylaxis 03/21/2013  . Augmentin [amoxicillin-pot clavulanate] Other (See  Comments) 11/25/2017  . Dexilant [dexlansoprazole] Diarrhea 02/21/2019  . Esomeprazole magnesium Diarrhea   . Iodine  10/25/2014  . Lipitor [atorvastatin] Other (See Comments) 07/13/2014  . Omeprazole Diarrhea   . Pantoprazole sodium Diarrhea   . Pepcid [famotidine] Diarrhea 02/21/2019  . Prevacid [lansoprazole] Diarrhea and Other (See Comments) 07/13/2014  . Rabeprazole sodium Diarrhea   . Spironolactone Other (See Comments) 07/13/2014  . Tagamet [cimetidine] Diarrhea 02/21/2019  . Terazosin Other (See Comments) 07/13/2014  . Tigan [trimethobenzamide hcl] Other (See Comments) 07/13/2014  . Zithromax [azithromycin] Other (See Comments) 07/13/2014  . Sulfonamide derivatives Swelling and Rash     Family History  Problem Relation Age of Onset  . Breast cancer Mother 64  . Colon cancer Neg Hx   . Colon polyps Neg Hx     Social History   Socioeconomic History  . Marital status: Married    Spouse name: Not on file  . Number of children: Not on file  . Years of education: Not on file  . Highest education level: Not on file  Occupational History  . Not on file  Tobacco Use  . Smoking status: Never Smoker  . Smokeless tobacco: Never Used  Substance and Sexual Activity  . Alcohol use: Yes    Comment: rare wine  . Drug use: No  . Sexual activity: Yes    Birth control/protection: Surgical  Other Topics Concern  . Not on file  Social History Narrative  . Not on file   Social Determinants of Health   Financial Resource Strain:   . Difficulty of Paying Living Expenses: Not on file  Food Insecurity:   . Worried About Charity fundraiser in the Last Year: Not on file  . Ran Out of Food in the Last Year: Not on file  Transportation Needs:   . Lack of Transportation (Medical): Not on file  . Lack of Transportation (Non-Medical): Not on file  Physical Activity:   . Days of Exercise per Week: Not on file  . Minutes of Exercise per Session: Not on file  Stress:   . Feeling of  Stress : Not on file  Social Connections:   . Frequency of Communication with Friends and Family: Not on file  . Frequency of Social Gatherings with Friends and Family: Not on file  . Attends Religious Services: Not on file  . Active Member of Clubs or Organizations: Not on file  . Attends Archivist Meetings: Not  on file  . Marital Status: Not on file    Review of Systems: Gen: Denies fever, chills.  Admits to some weakness in the setting of significant diarrhea.  Denies presyncope or syncope. CV: Denies chest pain or palpitations Resp: Denies dyspnea at rest or cough GI: See HPI. Derm: Denies rash Psych: Denies depression, anxiety Heme: Denies bruising or bleeding.   Physical Exam: BP 134/73   Pulse 83   Temp 97.7 F (36.5 C) (Temporal)   Ht  (1.575 m)   Wt 144 lb 12.8 oz (65.7 kg)   BMI 26.48 kg/m  General:   Alert and oriented. No distress noted. Pleasant and cooperative.  Head:  Normocephalic and atraumatic. Eyes:  Conjuctiva clear without scleral icterus. Heart:  S1, S2 present without murmurs appreciated. Lungs:  Clear to auscultation bilaterally. No wheezes, rales, or rhonchi. No distress.  Abdomen:  +BS, soft, and non-distended.  Very mild tenderness to deep palpation in the epigastric area and mid lower abdomen.  No rebound or guarding. No HSM or masses noted. Msk:  Symmetrical without gross deformities. Normal posture. Extremities:  Without edema. Neurologic:  Alert and  oriented x4 Psych: Normal mood and affect.

## 2019-03-13 ENCOUNTER — Ambulatory Visit (INDEPENDENT_AMBULATORY_CARE_PROVIDER_SITE_OTHER): Payer: Medicare Other | Admitting: Gastroenterology

## 2019-03-13 ENCOUNTER — Telehealth: Payer: Self-pay | Admitting: Gastroenterology

## 2019-03-13 ENCOUNTER — Other Ambulatory Visit: Payer: Self-pay

## 2019-03-13 ENCOUNTER — Encounter: Payer: Self-pay | Admitting: Gastroenterology

## 2019-03-13 VITALS — BP 134/73 | HR 83 | Temp 97.7°F | Ht 62.0 in | Wt 144.8 lb

## 2019-03-13 DIAGNOSIS — R1310 Dysphagia, unspecified: Secondary | ICD-10-CM | POA: Diagnosis not present

## 2019-03-13 DIAGNOSIS — K219 Gastro-esophageal reflux disease without esophagitis: Secondary | ICD-10-CM | POA: Diagnosis not present

## 2019-03-13 DIAGNOSIS — R197 Diarrhea, unspecified: Secondary | ICD-10-CM | POA: Diagnosis not present

## 2019-03-13 NOTE — Patient Instructions (Addendum)
Continue following GERD diet. Avoiding fired, fatty, greasy, and spicy foods. Avoid carbonated beverages, caffeine, and chocolate. Try eating small frequent meals rather than 3 meals a day. Do not eat within 3 hours of laying down. Prop the head of your bed up on wood or bricks to create at least a 6 inch incline.   I will discuss with one of the other providers regarding the best place to refer your for acid reflux surgery.   Continue gaviscon.   Be sure to drink something before taking your pills to wet our throat. Hopefully this will help prevent pills from feeling like they get hung.   Continue to monitor diarrhea. If this returns even after discontinuing the PPIs, please let us know.   We will follow-up with you in 3 months. Call if questions or concerns prior.   Aliene Altes, PA-C [facility]  Food Choices for Gastroesophageal Reflux Disease, Adult When you have gastroesophageal reflux disease (GERD), the foods you eat and your eating habits are very important. Choosing the right foods can help ease your discomfort. Think about working with a nutrition specialist (dietitian) to help you make good choices. What are tips for following this plan?  Meals  Choose healthy foods that are low in fat, such as fruits, vegetables, whole grains, low-fat dairy products, and lean meat, fish, and poultry.  Eat small meals often instead of 3 large meals a day. Eat your meals slowly, and in a place where you are relaxed. Avoid bending over or lying down until 2-3 hours after eating.  Avoid eating meals 2-3 hours before bed.  Avoid drinking a lot of liquid with meals.  Cook foods using methods other than frying. Bake, grill, or broil food instead.  Avoid or limit: ? Chocolate. ? Peppermint or spearmint. ? Alcohol. ? Pepper. ? Black and decaffeinated coffee. ? Black and decaffeinated tea. ? Bubbly (carbonated) soft drinks. ? Caffeinated energy drinks and soft  drinks.  Limit high-fat foods such as: ? Fatty meat or fried foods. ? Whole milk, cream, butter, or ice cream. ? Nuts and nut butters. ? Pastries, donuts, and sweets made with butter or shortening.  Avoid foods that cause symptoms. These foods may be different for everyone. Common foods that cause symptoms include: ? Tomatoes. ? Oranges, lemons, and limes. ? Peppers. ? Spicy food. ? Onions and garlic. ? Vinegar. Lifestyle  Maintain a healthy weight. Ask your doctor what weight is healthy for you. If you need to lose weight, work with your doctor to do so safely.  Exercise for at least 30 minutes for 5 or more days each week, or as told by your doctor.  Wear loose-fitting clothes.  Do not smoke. If you need help quitting, ask your doctor.  Sleep with the head of your bed higher than your feet. Use a wedge under the mattress or blocks under the bed frame to raise the head of the bed. Summary  When you have gastroesophageal reflux disease (GERD), food and lifestyle choices are very important in easing your symptoms.  Eat small meals often instead of 3 large meals a day. Eat your meals slowly, and in a place where you are relaxed.  Limit high-fat foods such as fatty meat or fried foods.  Avoid bending over or lying down until 2-3 hours after eating.  Avoid peppermint and spearmint, caffeine, alcohol, and chocolate. This information is not intended to replace advice given to you by your health care provider. Make sure you discuss any  questions you have with your health care provider. Document Released: 09/01/2011 Document Revised: 06/23/2018 Document Reviewed: 04/07/2016 Elsevier Patient Education  2020 [employer].

## 2019-03-13 NOTE — Assessment & Plan Note (Addendum)
Chronic history of GERD and has been intolerant to all PPIs and H2 blockers due to significant diarrhea and abdominal cramping.  Continues with significant daily reflux symptoms and reports her ENT states chronic sinusitis issues requiring multiple sinus surgeries are related to her uncontrolled reflux. Also with morning nausea. No regular vomiting. Intermittent hoarseness. Chronic epigastric pain. Recently underwent EGD on 02/22/2019 that revealed erosive reflux esophagitis.  Other findings included Schatzki's ring s/p dilation, medium sized hiatal hernia, erosive gastropathy s/p biopsied, otherwise normal.  Pathology with reactive gastropathy and no H. Pylori.   As EGD confirmed reflux esophagitis and we have exhausted all medical therapy, I will refer patient to [facility] for antireflux surgery.  She is to continue using Gaviscon as needed. Reinforced GERD diet and lifestyle and handout provided. Follow-up in 3 months.

## 2019-03-13 NOTE — Assessment & Plan Note (Addendum)
History of solid food dysphagia.  EGD on 02/22/2019 with reflux esophagitis and Schatzki's ring s/p dilation.  She has had improvement of solid food dysphagia.  She does report pills seem to stick in her throat at times but go down with drinking something after them.  Not currently drinking anything prior to taken medications.  She also continues with significant acid reflux daily and is intolerant to all PPIs as well as H2 blockers.  Suspect this may also be playing a role in her ongoing mild dysphagia symptoms.  At this point, we are referring her for antireflux surgery.  She was also advised to drink something to moisten her mucous membranes prior to taking her medications.  Continue to monitor symptoms and follow-up in 3 months.  Call if questions or concerns prior.

## 2019-03-13 NOTE — Assessment & Plan Note (Addendum)
Diarrhea associated with PPIs. Also with intermittent lower abdominal pain associated with diarrhea. C. difficile and GI pathogen panel negative.  Diarrhea seems to resolve with PPI discontinuation.  She has essentially been tried on all PPIs and is intolerant.  Most recently discontinued omeprazole 1 week ago and diarrhea has finally tapered off with no diarrhea in the last 48 hours. No significant abdominal pain at this time. No alarm features.  Colonoscopy up-to-date in September 2019 with diverticulosis which was prior to onset of diarrhea.  Due to close associated with diarrhea and PPIs, evaluation for microscopic colitis was not pursued.  As diarrhea has resolved, she was advised to continue to monitor symptoms.  She has Bentyl to use as needed for recurrent intermittent diarrhea.    Follow-up in 3 months.

## 2019-03-13 NOTE — Telephone Encounter (Signed)
Rhonda Murphy, please let patient know we will refer her to [facility] for anti-reflux surgery. Can you please place the referral once you let her know?

## 2019-03-14 NOTE — Progress Notes (Signed)
Cc'ed to pcp °

## 2019-03-14 NOTE — Addendum Note (Signed)
Addended by: Hassan Rowan on: 03/14/2019 10:51 AM   Modules accepted: Orders

## 2019-03-14 NOTE — Telephone Encounter (Signed)
Called and informed pt, she is agreeable. Referral faxed to [facility].

## 2019-04-03 ENCOUNTER — Ambulatory Visit: Payer: Medicare Other | Admitting: Gastroenterology

## 2019-06-12 ENCOUNTER — Ambulatory Visit: Payer: Medicare Other | Admitting: Gastroenterology

## 2020-08-06 IMAGING — DX DG CHEST 2V
2 series · 2 of 2 positions shown · non-contrast
Comparison: 04/18/2015

CLINICAL DATA: Shortness of breath, cough

EXAM:
CHEST - 2 VIEW

[chest pa]
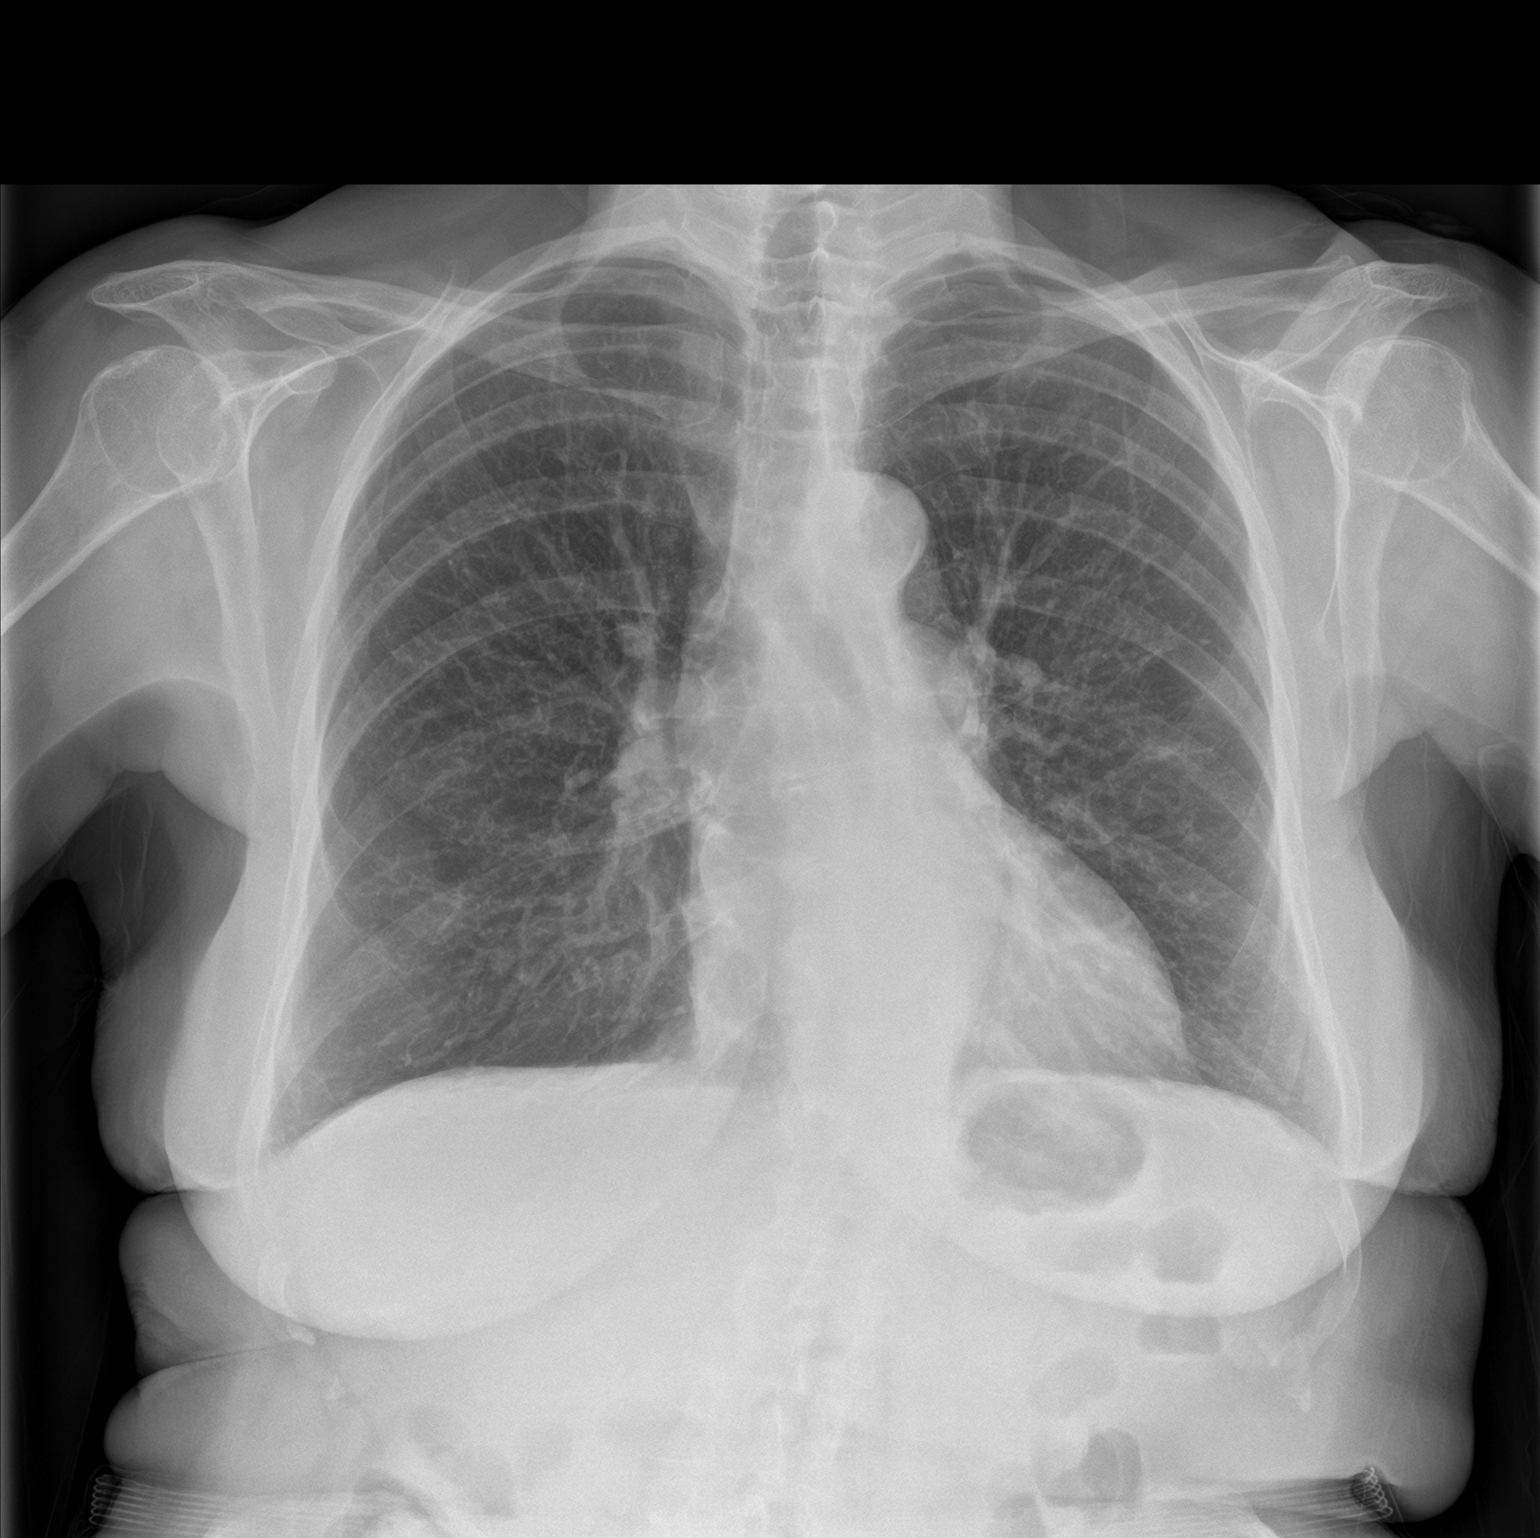

[chest lat]
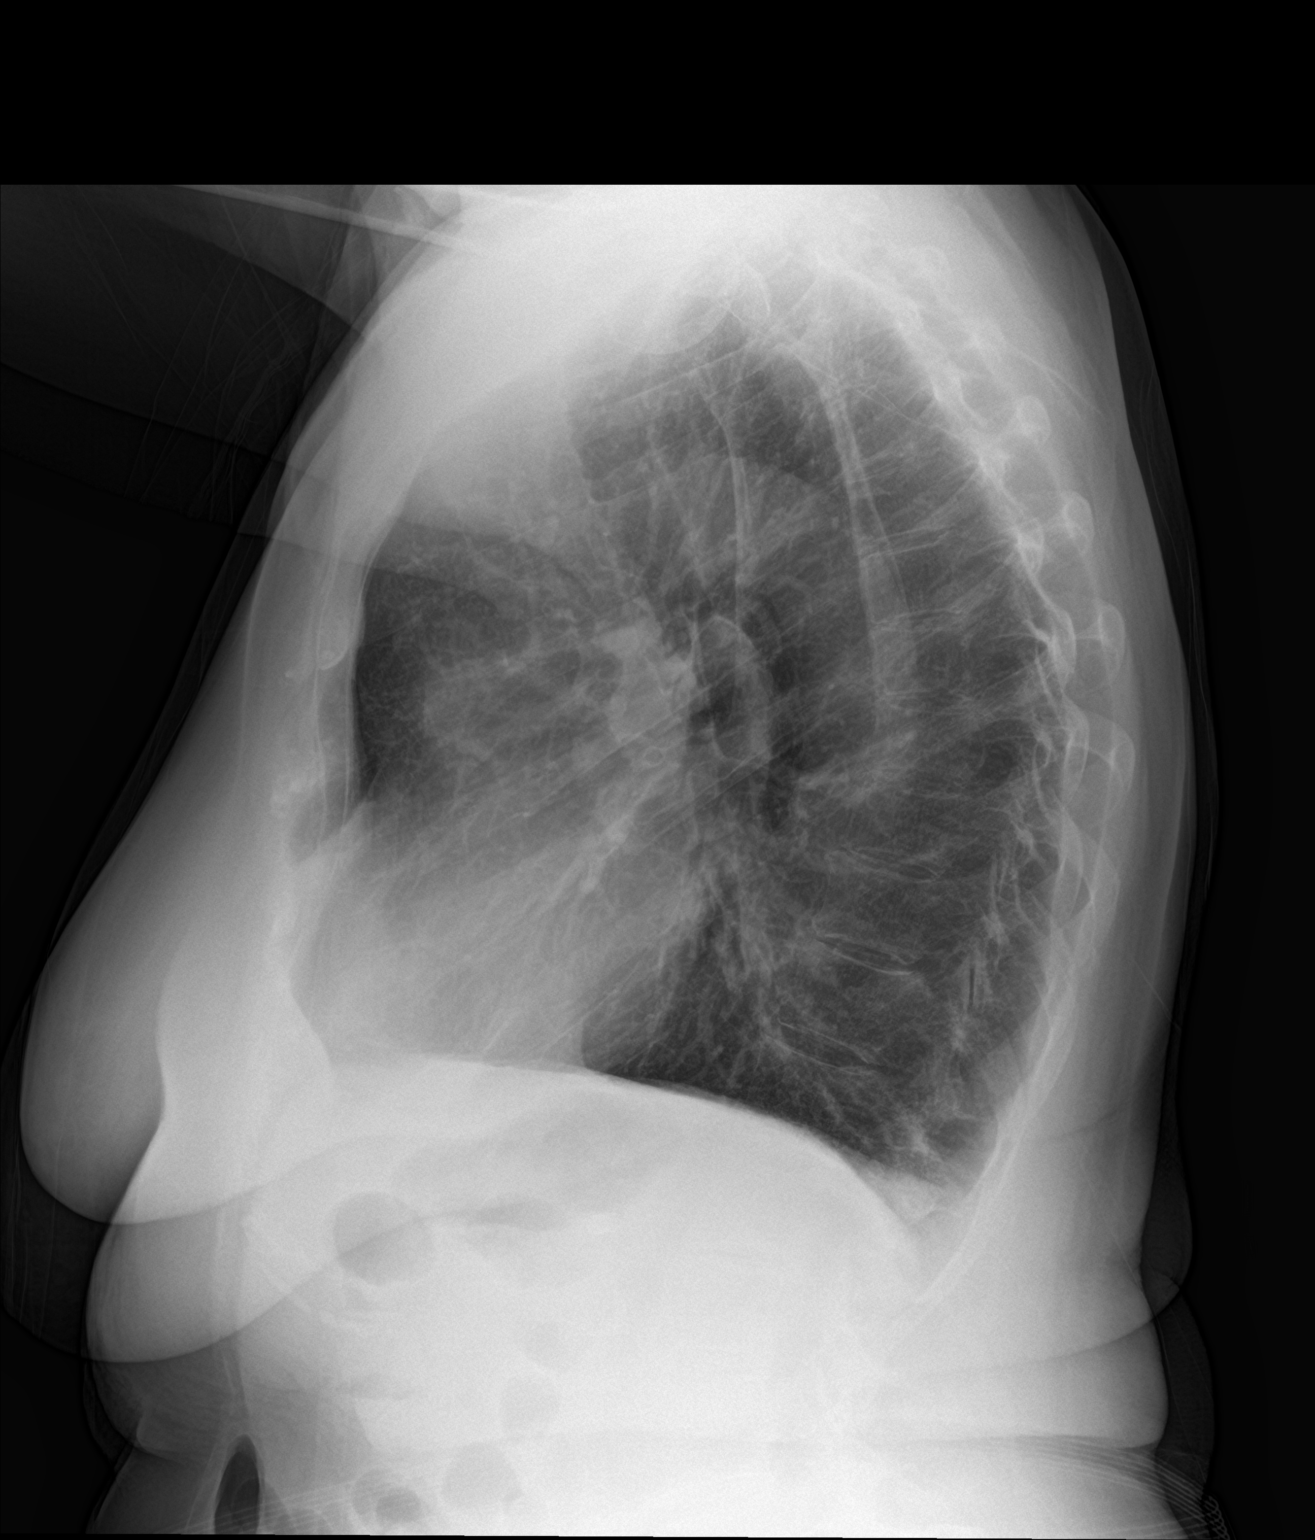

[2 of 2 positions shown; findings below may reference images not displayed]

FINDINGS: There is hyperinflation of the lungs compatible with COPD. Heart and
mediastinal contours are within normal limits. No focal opacities or
effusions. No acute bony abnormality.
IMPRESSION: COPD.  No active disease.

## 2021-05-10 IMAGING — CT CT ABD-PELV W/ CM
2 of 5 series · 15 of 46 positions shown, 17 images · IV contrast (Omnipaque or Isovue)
Comparison: Noncontrast CT on 08/16/2017

CLINICAL DATA: Bilateral upper quadrant pain and diarrhea for 2
weeks.

EXAM:
CT ABDOMEN AND PELVIS WITH CONTRAST
TECHNIQUE: Multidetector CT imaging of the abdomen and pelvis was performed
using the standard protocol following bolus administration of
intravenous contrast.
CONTRAST:  75mL OMNIPAQUE IOHEXOL 300 MG/ML  SOLN

[Series 2: axial st · axial · 0.74mm/px · z∈[+1088,+1398]mm · 12 of 74 slices shown, 14 images]
[im 6/74  soft-tissue]
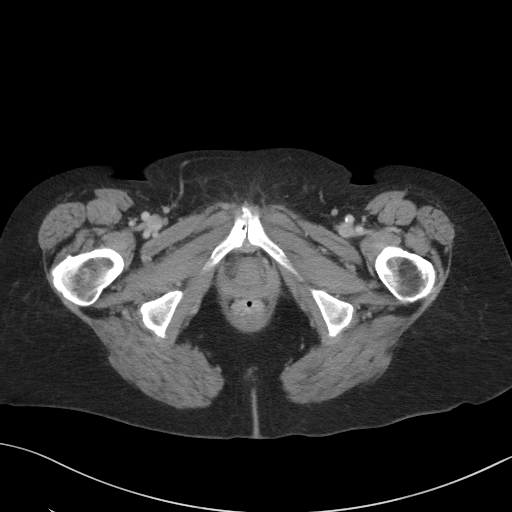
[im 6/74  bone]
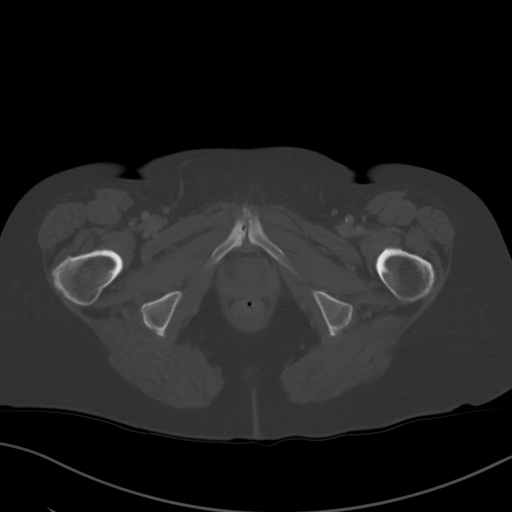
[im 12/74  soft-tissue]
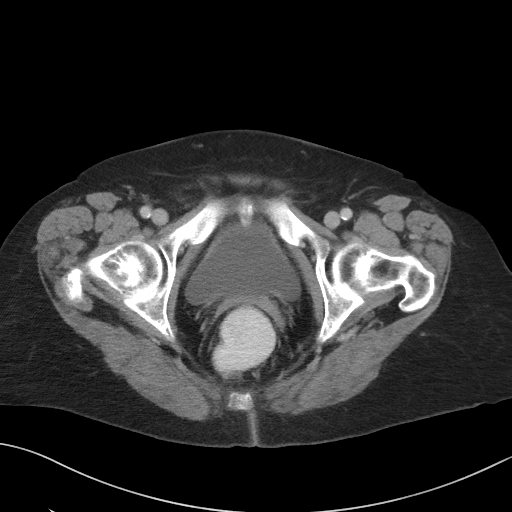
[im 17/74  soft-tissue]
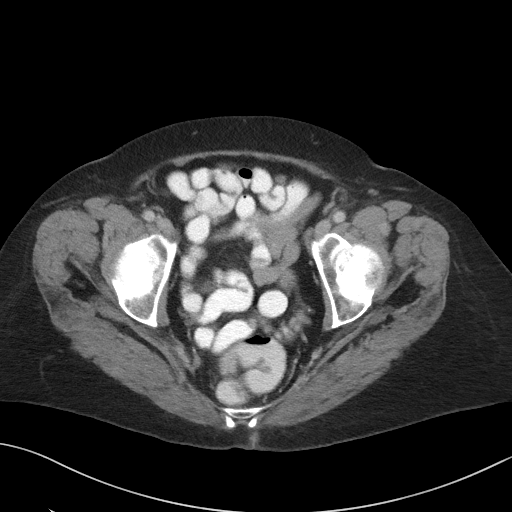
[im 23/74  soft-tissue]
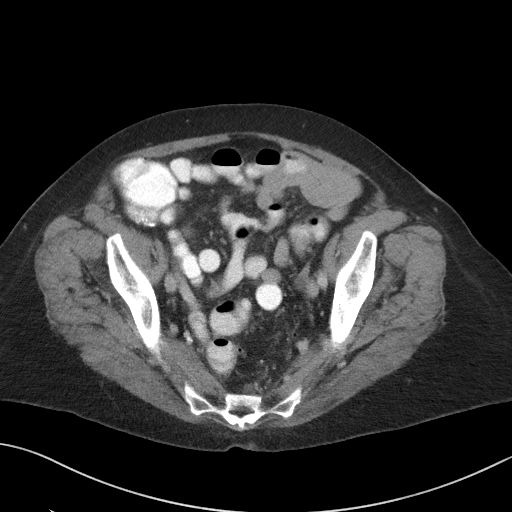
[im 29/74  soft-tissue]
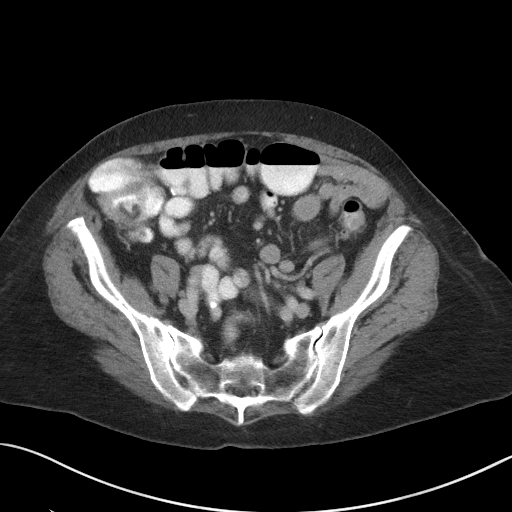
[im 34/74  soft-tissue]
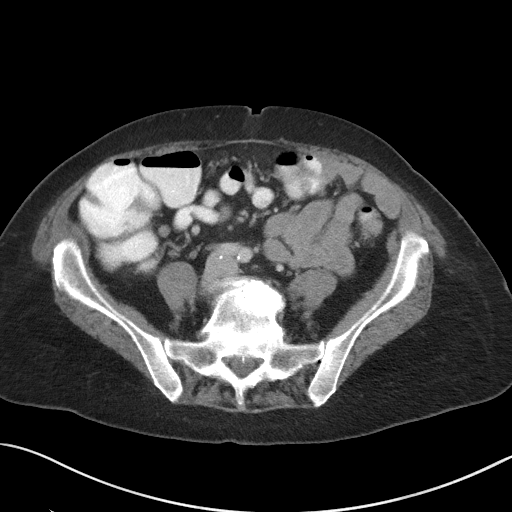
[im 40/74  soft-tissue]
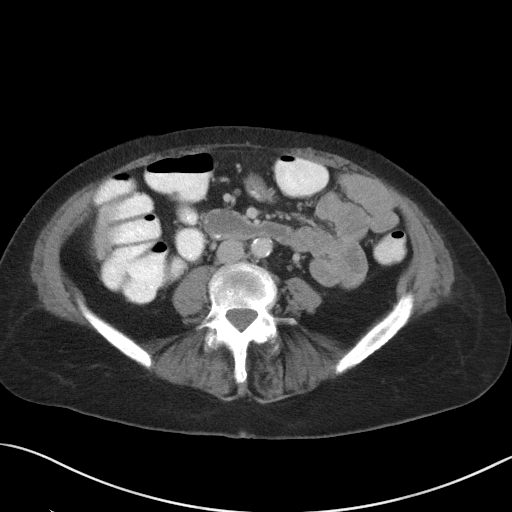
[im 45/74  soft-tissue]
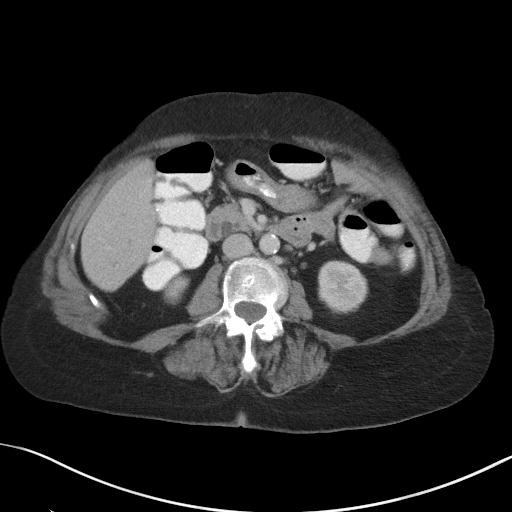
[im 51/74  soft-tissue]
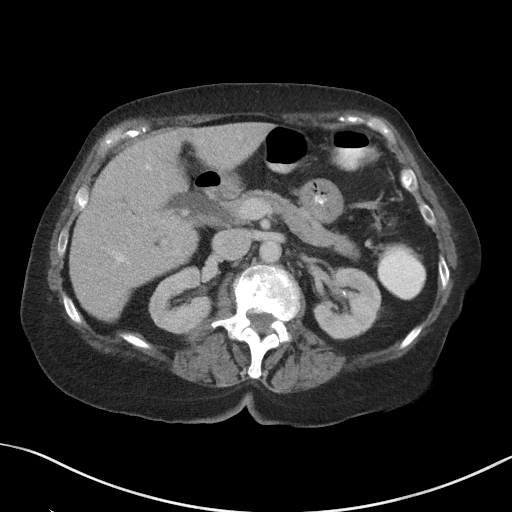
[im 51/74  bone]
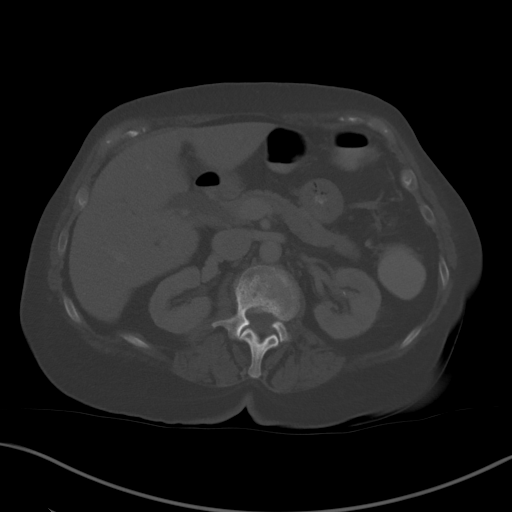
[im 57/74  soft-tissue]
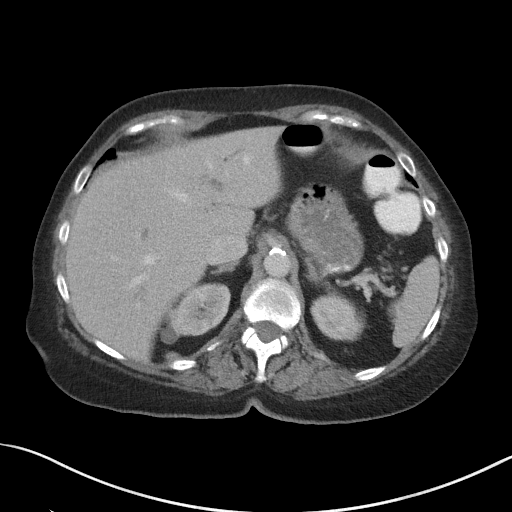
[im 62/74  soft-tissue]
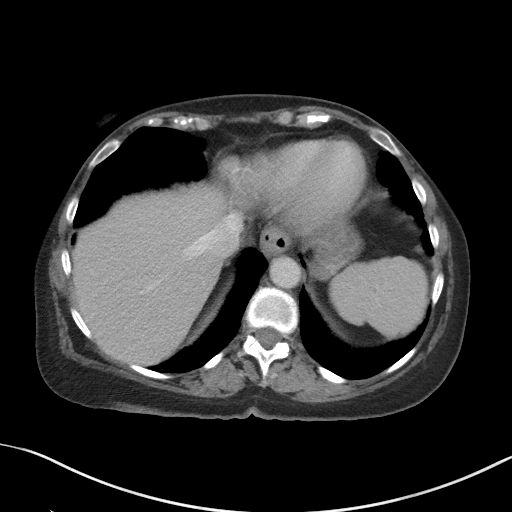
[im 68/74  soft-tissue]
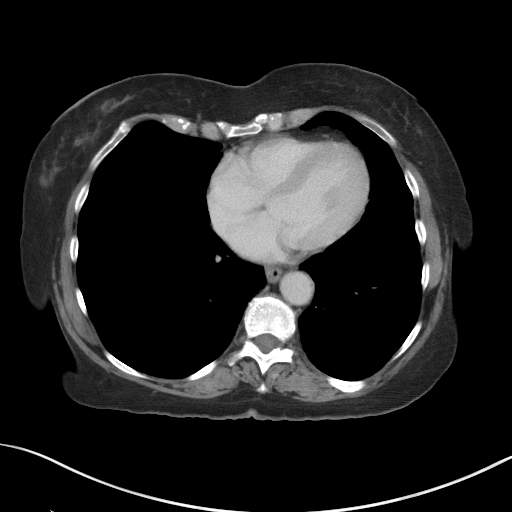

[Series 5: coronal st · coronal · 0.72mm/px · 3 of 97 slices shown]
[im 33/97  soft-tissue]
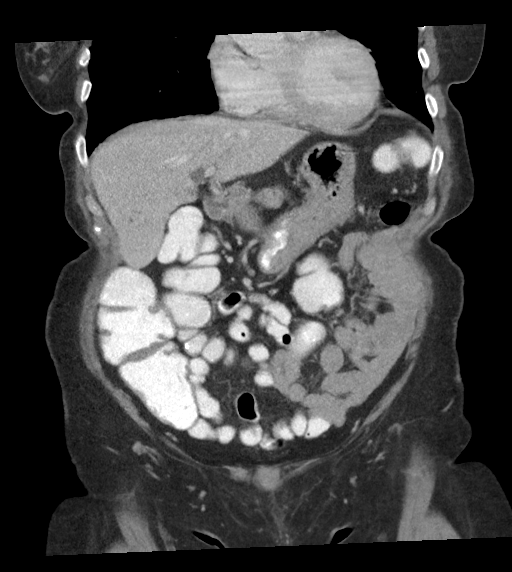
[im 43/97  soft-tissue]
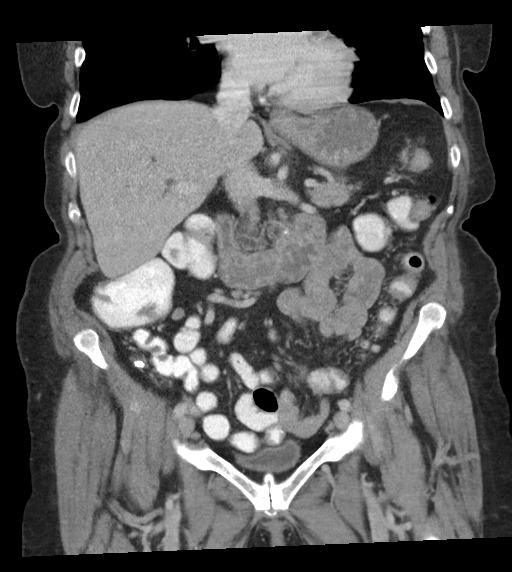
[im 54/97  soft-tissue]
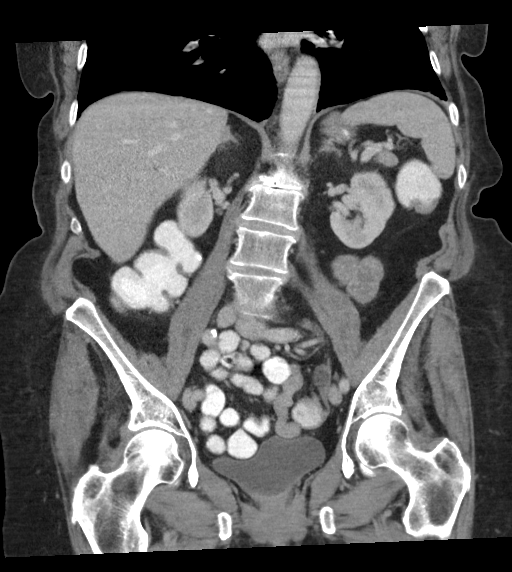

[15 of 46 positions shown; findings below may reference images not displayed]

FINDINGS: Lower Chest: No acute findings.

Hepatobiliary: No hepatic masses identified. Prior cholecystectomy.
Increased diffuse biliary ductal dilatation is seen, no obstructing
etiology visualized by CT.

Pancreas: No mass or inflammatory changes. No evidence of pancreatic
ductal dilatation

Spleen: Within normal limits in size and appearance.

Adrenals/Urinary Tract: No masses identified. A few small bilateral
renal cysts are noted. Tiny 3 mm calculus versus vascular
calcification again seen in the left renal. No evidence of ureteral
calculi hydronephrosis. Unremarkable unopacified urinary bladder.

Stomach/Bowel: No evidence of obstruction, inflammatory process or
abnormal fluid collections.

Vascular/Lymphatic: No pathologically enlarged lymph nodes. No
abdominal aortic aneurysm. Aortic atherosclerosis.

Reproductive: Prior hysterectomy noted. Adnexal regions are
unremarkable in appearance.

Other:  None.

Musculoskeletal:  No suspicious bone lesions identified.
IMPRESSION: 1. Prior cholecystectomy increased diffuse biliary ductal
dilatation, but no obstructing etiology visualized by CT. Recommend
correlation with liver function tests, and consider abdomen MRI MRCP
without and with contrast for further evaluation if warranted.
2. Tiny left renal calculus versus vascular calcification. No
evidence of ureteral calculi or hydronephrosis.

Aortic Atherosclerosis ([id number].E).

## 2021-05-14 ENCOUNTER — Other Ambulatory Visit: Payer: Self-pay

## 2021-05-14 ENCOUNTER — Ambulatory Visit (INDEPENDENT_AMBULATORY_CARE_PROVIDER_SITE_OTHER): Payer: Medicare Other | Admitting: Urology

## 2021-05-14 ENCOUNTER — Encounter: Payer: Self-pay | Admitting: Urology

## 2021-05-14 VITALS — BP 150/72 | HR 85

## 2021-05-14 DIAGNOSIS — N390 Urinary tract infection, site not specified: Secondary | ICD-10-CM | POA: Diagnosis not present

## 2021-05-14 DIAGNOSIS — N3021 Other chronic cystitis with hematuria: Secondary | ICD-10-CM

## 2021-05-14 LAB — URINALYSIS, ROUTINE W REFLEX MICROSCOPIC
Bilirubin, UA: NEGATIVE
Glucose, UA: NEGATIVE
Ketones, UA: NEGATIVE
Nitrite, UA: NEGATIVE
Protein,UA: NEGATIVE
RBC, UA: NEGATIVE
Specific Gravity, UA: 1.015 (ref 1.005–1.030)
Urobilinogen, Ur: 0.2 mg/dL (ref 0.2–1.0)
pH, UA: 5.5 (ref 5.0–7.5)

## 2021-05-14 LAB — BLADDER SCAN AMB NON-IMAGING: Scan Result: 4

## 2021-05-14 LAB — MICROSCOPIC EXAMINATION
Bacteria, UA: NONE SEEN
RBC, Urine: NONE SEEN /hpf (ref 0–2)
Renal Epithel, UA: NONE SEEN /hpf

## 2021-05-14 MED ORDER — PREMARIN 0.625 MG/GM VA CREA: 1.0000 | TOPICAL_CREAM | VAGINAL | 3 refills | Status: DC

## 2021-05-14 MED ORDER — FLUCONAZOLE 150 MG PO TABS: 150.0000 mg | ORAL_TABLET | Freq: Every day | ORAL | 1 refills | Status: DC

## 2021-05-14 NOTE — Progress Notes (Signed)
post void residual = 

## 2021-05-14 NOTE — Progress Notes (Signed)
05/14/2021 11:04 AM   Rhonda Murphy [birth year] [id number]  Referring provider: Alinda Deem, MD [address] [place],  [place] [address]  Recurrent UTI   HPI: Rhonda Murphy is a 76yo here for evaluation of recurrent UTI. Starting in 12/2020 she was treated 3 UTIs. She was treated with keflex, augmentin and macrobid. She has treated twice for a vaginal yeast infection. She was also treated with flagyl. She has aily urgency and urge incontinence. No numbness/tingling in fingers/toes.  She uses 3 pads per day which are damp. She started using pads 2 years ago.  No hx of ovarian or breast cancer. No hx of clotting disorder.    PMH: Past Medical History:  Diagnosis Date   Acid reflux    Arthritis    Asthma    Asthma    Back pain    C. difficile colitis 2011   Enteritis 03/21/2013   History of kidney stones    Hypertension    Influenza A 03/24/2013   LBBB (left bundle branch block) 03/21/2013   Mitral valve prolapse    Shingles    Small bowel obstruction (HCC) 03/24/2013   SBO vs. enteritis.    Stroke [facility])     Surgical History: Past Surgical History:  Procedure Laterality Date   ABDOMINAL HYSTERECTOMY     APPENDECTOMY     BIOPSY  02/22/2019   Procedure: BIOPSY;  Surgeon: Corbin Ade, MD;  [facility];  Service: Endoscopy;;   CATARACT EXTRACTION Bilateral    CHOLECYSTECTOMY     COLONOSCOPY  2010   Dr. Samuella Cota   COLONOSCOPY WITH PROPOFOL N/A 12/02/2017   Dr. Jena Gauss: diverticulosis.    ESOPHAGOGASTRODUODENOSCOPY  2011   Dr. Jena Gauss: patent Schatzki's ring s/p dilation, esophageal biopsies negative, duodenal biopsies negative. Intolerant to PPIs in past and took H2 blockers   ESOPHAGOGASTRODUODENOSCOPY N/A 02/22/2019   Procedure: ESOPHAGOGASTRODUODENOSCOPY (EGD);  Surgeon: Corbin Ade, MD;  Location: [facility];  Service: Endoscopy;  Laterality: N/A;  2:15pm   EXPLORATORY LAPAROTOMY     in her 30s, removed appendix, hysterectomy, cholecystectomy, varicose  veins in abdomen   JOINT REPLACEMENT     Rt knee   MALONEY DILATION N/A 02/22/2019   Procedure: MALONEY DILATION;  Surgeon: Corbin Ade, MD;  Location: [facility];  Service: Endoscopy;  Laterality: N/A;   RENAL ARTERY STENT  2019   [place]   sinus surgeries     x 2 (11/2017; 10/2018). First at [facility] and second at [facility]   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   Procedure: TOTAL KNEE ARTHROPLASTY;  Surgeon: Kennedy Bucker, MD;  Location: [facility] ORS;  Service: Orthopedics;  Laterality: Left;   TOTAL KNEE REVISION Left 04/16/2015   Procedure: TOTAL KNEE REVISION;  Surgeon: Kennedy Bucker, MD;  Location: [facility] ORS;  Service: Orthopedics;  Laterality: Left;    Home Medications:  Allergies as of 05/14/2021       Reactions   Influenza Vaccines Rash   Sulfa Antibiotics Anaphylaxis   Rash all over, tongue swells per patient   Trimethobenzamide Other (See Comments), Swelling   Headaches/leg swelling Headaches/leg swelling Headaches/leg swelling   Augmentin [amoxicillin-pot Clavulanate] Other (See Comments)   Mouth sores/thrush Did it involve swelling of the face/tongue/throat, SOB, or low BP? No Did it involve sudden or severe rash/hives, skin peeling, or any reaction on the inside of your mouth or nose? No Did you need to seek medical attention at a hospital  or doctor's office? No When did it last happen?      within the past 10 years If all above answers are NO, may proceed with cephalosporin use.   Dexilant [dexlansoprazole] Diarrhea   Abdominal cramps   Esomeprazole Magnesium Diarrhea   Abdominal cramps   Hydralazine    Iodine    Topical iodine, caused burning sensation.   Lipitor [atorvastatin] Other (See Comments)   Leg cramps   Omeprazole Diarrhea   Abdominal cramps   Pantoprazole Sodium Diarrhea   Abdominal cramps   Pepcid [famotidine] Diarrhea   Abdominal cramps    Prevacid [lansoprazole] Diarrhea, Other (See Comments)   GI Distress  Abdominal cramps   Rabeprazole Sodium Diarrhea   Abdominal cramps   Spironolactone Other (See Comments)   Elevated BUN   Tagamet [cimetidine] Diarrhea   Abdominal cramps   Terazosin Other (See Comments)   headache   Tigan [trimethobenzamide Hcl] Other (See Comments)   Headaches/leg swelling   Zithromax [azithromycin] Other (See Comments)   GI distress   Sulfonamide Derivatives Swelling, Rash   Tongue swelling        Medication List        Accurate as of May 14, 2021 11:04 AM. If you have any questions, ask your nurse or doctor.          STOP taking these medications    albuterol 108 (90 Base) MCG/ACT inhaler Commonly known as: VENTOLIN HFA Stopped by: Wilkie Aye, MD   dicyclomine 10 MG capsule Commonly known as: BENTYL Stopped by: Wilkie Aye, MD   EPINEPHrine 0.3 mg/0.3 mL Soaj injection Commonly known as: EPI-PEN Stopped by: Wilkie Aye, MD       TAKE these medications    acetaminophen 650 MG CR tablet Commonly known as: TYLENOL Take 1,300 mg by mouth 2 (two) times daily as needed for pain.   amitriptyline 10 MG tablet Commonly known as: ELAVIL Take 10 mg by mouth at bedtime.   amLODipine 10 MG tablet Commonly known as: NORVASC Take 10 mg by mouth at bedtime.   aspirin-acetaminophen-caffeine 250-250-65 MG tablet Commonly known as: EXCEDRIN MIGRAINE Take 1-2 tablets by mouth every 6 (six) hours as needed for headache.   Breo Ellipta 100-25 MCG/INH Aepb Generic drug: fluticasone furoate-vilanterol Inhale 1 puff into the lungs daily.   cetirizine 10 MG tablet Commonly known as: ZYRTEC Take 10 mg by mouth daily as needed for allergies.   chlorthalidone 25 MG tablet Commonly known as: HYGROTON Take 25 mg by mouth daily.   cloNIDine 0.1 MG tablet Commonly known as: CATAPRES Take 0.1 mg by mouth 2 (two) times daily.   Gaviscon 80-14.2 MG Chew Generic drug: Alum Hydroxide-Mag Trisilicate Chew 1-2 tablets by mouth daily as  needed (indigestion).   Gaviscon 95-358 MG/15ML Susp Generic drug: aluminum hydroxide-magnesium carbonate Take 15 mLs by mouth daily as needed for indigestion.   Linzess 145 MCG Caps capsule Generic drug: linaclotide Take 145 mcg by mouth daily before breakfast. Taking as needed.   losartan 100 MG tablet Commonly known as: COZAAR Take 100 mg by mouth daily.   montelukast 10 MG tablet Commonly known as: SINGULAIR Take 10 mg by mouth every evening.   multivitamin with minerals Tabs tablet Take 1 tablet by mouth daily. Centrum silver   NON FORMULARY Inject 1 Dose as directed See admin instructions. Allergy shot every 4 weeks   saccharomyces boulardii 250 MG capsule Commonly known as: FLORASTOR Take 250 mg by mouth daily.   simvastatin 20 MG tablet  Commonly known as: ZOCOR Take 20 mg by mouth every evening.   spironolactone 50 MG tablet Commonly known as: ALDACTONE Take 50 mg by mouth daily.        Allergies:  Allergies  Allergen Reactions   Influenza Vaccines Rash   Sulfa Antibiotics Anaphylaxis    Rash all over, tongue swells per patient   Trimethobenzamide Other (See Comments) and Swelling    Headaches/leg swelling Headaches/leg swelling Headaches/leg swelling    Augmentin [Amoxicillin-Pot Clavulanate] Other (See Comments)    Mouth sores/thrush Did it involve swelling of the face/tongue/throat, SOB, or low BP? No Did it involve sudden or severe rash/hives, skin peeling, or any reaction on the inside of your mouth or nose? No Did you need to seek medical attention at a hospital or doctor's office? No When did it last happen?      within the past 10 years If all above answers are NO, may proceed with cephalosporin use.     Dexilant [Dexlansoprazole] Diarrhea    Abdominal cramps   Esomeprazole Magnesium Diarrhea    Abdominal cramps   Hydralazine    Iodine     Topical iodine, caused burning sensation.   Lipitor [Atorvastatin] Other (See Comments)     Leg cramps   Omeprazole Diarrhea    Abdominal cramps   Pantoprazole Sodium Diarrhea    Abdominal cramps    Pepcid [Famotidine] Diarrhea    Abdominal cramps    Prevacid [Lansoprazole] Diarrhea and Other (See Comments)    GI Distress Abdominal cramps    Rabeprazole Sodium Diarrhea    Abdominal cramps    Spironolactone Other (See Comments)    Elevated BUN   Tagamet [Cimetidine] Diarrhea    Abdominal cramps   Terazosin Other (See Comments)    headache   Tigan [Trimethobenzamide Hcl] Other (See Comments)    Headaches/leg swelling   Zithromax [Azithromycin] Other (See Comments)    GI distress   Sulfonamide Derivatives Swelling and Rash    Tongue swelling    Family History: Family History  Problem Relation Age of Onset   Breast cancer Mother 40   Colon cancer Neg Hx    Colon polyps Neg Hx     Social History:  reports that she has never smoked. She has never used smokeless tobacco. She reports current alcohol use. She reports that she does not use drugs.  ROS: All other review of systems were reviewed and are negative except what is noted above in HPI  Physical Exam: BP (!) 150/72   Pulse 85   Constitutional:  Alert and oriented, No acute distress. HEENT: [place] AT, moist mucus membranes.  Trachea midline, no masses. Cardiovascular: No clubbing, cyanosis, or edema. Respiratory: Normal respiratory effort, no increased work of breathing. GI: Abdomen is soft, nontender, nondistended, no abdominal masses GU: No CVA tenderness. Moderate labial atrophy, mild vaginal stenosis. Grade 1 cystocele. Lymph: No cervical or inguinal lymphadenopathy. Skin: No rashes, bruises or suspicious lesions. Neurologic: Grossly intact, no focal deficits, moving all 4 extremities. Psychiatric: Normal mood and affect.  Laboratory Data: Lab Results  Component Value Date   WBC 6.6 01/25/2019   HGB 12.1 01/25/2019   HCT 37.6 01/25/2019   MCV 88 01/25/2019   PLT 408 01/25/2019    Lab Results   Component Value Date   CREATININE 1.20 (H) 01/25/2019    No results found for: PSA  No results found for: TESTOSTERONE  No results found for: HGBA1C  Urinalysis    Component Value Date/Time  COLORURINE YELLOW (A) 04/09/2015 0936   APPEARANCEUR CLEAR (A) 04/09/2015 0936   APPEARANCEUR Clear 07/04/2014 1329   LABSPEC 1.008 04/09/2015 0936   LABSPEC 1.009 07/04/2014 1329   PHURINE 6.0 04/09/2015 0936   GLUCOSEU NEGATIVE 04/09/2015 0936   GLUCOSEU Negative 07/04/2014 1329   HGBUR NEGATIVE 04/09/2015 0936   BILIRUBINUR NEGATIVE 04/09/2015 0936   BILIRUBINUR Negative 07/04/2014 1329   KETONESUR NEGATIVE 04/09/2015 0936   PROTEINUR NEGATIVE 04/09/2015 0936   UROBILINOGEN 0.2 03/21/2013 1432   NITRITE POSITIVE (A) 04/09/2015 0936   LEUKOCYTESUR 2+ (A) 04/09/2015 0936   LEUKOCYTESUR Negative 07/04/2014 1329    Lab Results  Component Value Date   BACTERIA RARE (A) 04/09/2015    Pertinent Imaging:  No results found for this or any previous visit.  No results found for this or any previous visit.  No results found for this or any previous visit.  No results found for this or any previous visit.  No results found for this or any previous visit.  No results found for this or any previous visit.  No results found for this or any previous visit.  No results found for this or any previous visit.   Assessment & Plan:    1.  Chronic cystitis with hematuria --We discussed the natural hx of recurrent UTIs and the various causes. We discussed the treatment options including post coital prophylaxis, daily prophylaxis, topical estrogen therapy. We will trial topical estrogen therapy QOD. RTC 3 months  Diflucan 150mg  daily x 2 days   No follow-ups on file.  Wilkie Aye, MD  [facility] [place]

## 2021-05-14 NOTE — Patient Instructions (Signed)
Urinary Tract Infection, Adult A urinary tract infection (UTI) is an infection of any part of the urinary tract. The urinary tract includes the kidneys, ureters, bladder, and urethra. These organs make, store, and get rid of urine in the body. An upper UTI affects the ureters and kidneys. A lower UTI affects the bladder and urethra. What are the causes? Most urinary tract infections are caused by bacteria in your genital area around your urethra, where urine leaves your body. These bacteria grow and cause inflammation of your urinary tract. What increases the risk? You are more likely to develop this condition if: You have a urinary catheter that stays in place. You are not able to control when you urinate or have a bowel movement (incontinence). You are female and you: Use a spermicide or diaphragm for birth control. Have low estrogen levels. Are pregnant. You have certain genes that increase your risk. You are sexually active. You take antibiotic medicines. You have a condition that causes your flow of urine to slow down, such as: An enlarged prostate, if you are female. Blockage in your urethra. A kidney stone. A nerve condition that affects your bladder control (neurogenic bladder). Not getting enough to drink, or not urinating often. You have certain medical conditions, such as: Diabetes. A weak disease-fighting system (immunesystem). Sickle cell disease. Gout. Spinal cord injury. What are the signs or symptoms? Symptoms of this condition include: Needing to urinate right away (urgency). Frequent urination. This may include small amounts of urine each time you urinate. Pain or burning with urination. Blood in the urine. Urine that smells bad or unusual. Trouble urinating. Cloudy urine. Vaginal discharge, if you are female. Pain in the abdomen or the lower back. You may also have: Vomiting or a decreased appetite. Confusion. Irritability or tiredness. A fever or  chills. Diarrhea. The first symptom in older adults may be confusion. In some cases, they may not have any symptoms until the infection has worsened. How is this diagnosed? This condition is diagnosed based on your medical history and a physical exam. You may also have other tests, including: Urine tests. Blood tests. Tests for STIs (sexually transmitted infections). If you have had more than one UTI, a cystoscopy or imaging studies may be done to determine the cause of the infections. How is this treated? Treatment for this condition includes: Antibiotic medicine. Over-the-counter medicines to treat discomfort. Drinking enough water to stay hydrated. If you have frequent infections or have other conditions such as a kidney stone, you may need to see a health care provider who specializes in the urinary tract (urologist). In rare cases, urinary tract infections can cause sepsis. Sepsis is a life-threatening condition that occurs when the body responds to an infection. Sepsis is treated in the hospital with IV antibiotics, fluids, and other medicines. Follow these instructions at home: Medicines Take over-the-counter and prescription medicines only as told by your health care provider. If you were prescribed an antibiotic medicine, take it as told by your health care provider. Do not stop using the antibiotic even if you start to feel better. General instructions Make sure you: Empty your bladder often and completely. Do not hold urine for long periods of time. Empty your bladder after sex. Wipe from front to back after urinating or having a bowel movement if you are female. Use each tissue only one time when you wipe. Drink enough fluid to keep your urine pale yellow. Keep all follow-up visits. This is important. Contact a health care provider   if: Your symptoms do not get better after 1-2 days. Your symptoms go away and then return. Get help right away if: You have severe pain in your  back or your lower abdomen. You have a fever or chills. You have nausea or vomiting. Summary A urinary tract infection (UTI) is an infection of any part of the urinary tract, which includes the kidneys, ureters, bladder, and urethra. Most urinary tract infections are caused by bacteria in your genital area. Treatment for this condition often includes antibiotic medicines. If you were prescribed an antibiotic medicine, take it as told by your health care provider. Do not stop using the antibiotic even if you start to feel better. Keep all follow-up visits. This is important. This information is not intended to replace advice given to you by your health care provider. Make sure you discuss any questions you have with your health care provider. Document Revised: 10/13/2019 Document Reviewed: 10/13/2019 Elsevier Patient Education  2022 [employer]  

## 2021-05-16 ENCOUNTER — Telehealth: Payer: Self-pay

## 2021-05-16 NOTE — Telephone Encounter (Signed)
Pt called and said that the cream that was sent in for her $500 she is no able to afford it and wanted to know if she can have something cheaper sent to the pharmacy ,please advise  ?

## 2021-05-30 MED ORDER — ESTRADIOL 0.1 MG/GM VA CREA: TOPICAL_CREAM | VAGINAL | 12 refills | Status: DC

## 2021-05-30 NOTE — Telephone Encounter (Signed)
Rx sent. Pt called and made aware. 

## 2021-05-30 NOTE — Addendum Note (Signed)
Addended byGustavus Messing on: 05/30/2021 02:55 PM ? ? Modules accepted: Orders ? ?

## 2021-08-13 ENCOUNTER — Ambulatory Visit: Payer: Medicare Other | Admitting: Urology

## 2021-08-13 ENCOUNTER — Ambulatory Visit (INDEPENDENT_AMBULATORY_CARE_PROVIDER_SITE_OTHER): Payer: Medicare Other | Admitting: Urology

## 2021-08-13 DIAGNOSIS — N3021 Other chronic cystitis with hematuria: Secondary | ICD-10-CM

## 2021-08-13 DIAGNOSIS — N390 Urinary tract infection, site not specified: Secondary | ICD-10-CM | POA: Diagnosis not present

## 2021-08-13 LAB — URINALYSIS, ROUTINE W REFLEX MICROSCOPIC
Bilirubin, UA: NEGATIVE
Glucose, UA: NEGATIVE
Ketones, UA: NEGATIVE
Nitrite, UA: NEGATIVE
Protein,UA: NEGATIVE
RBC, UA: NEGATIVE
Specific Gravity, UA: 1.015 (ref 1.005–1.030)
Urobilinogen, Ur: 0.2 mg/dL (ref 0.2–1.0)
pH, UA: 6 (ref 5.0–7.5)

## 2021-08-13 LAB — MICROSCOPIC EXAMINATION: Renal Epithel, UA: NONE SEEN /hpf

## 2021-08-13 MED ORDER — NITROFURANTOIN MACROCRYSTAL 50 MG PO CAPS: 50.0000 mg | ORAL_CAPSULE | Freq: Every day | ORAL | 11 refills | Status: DC

## 2021-08-13 MED ORDER — NITROFURANTOIN MONOHYD MACRO 100 MG PO CAPS: 100.0000 mg | ORAL_CAPSULE | Freq: Two times a day (BID) | ORAL | 0 refills | Status: DC

## 2021-08-13 NOTE — Progress Notes (Signed)
08/13/2021 10:40 AM   Rhonda Murphy [birth year] [id number]  Referring provider: Earney Mallet, MD [address] [place] [address]  dysuria   HPI: Rhonda Murphy is a 76yo here for evaluation of new onset dysuria. She developed dysuria and pelvic pressure yesterday. UA concerning for infection. She did not get the estrogen cream for her recurrent UTI and OAb symptoms. She has mild urinary urgency but no urinary frequency. She denies any gross hematuria.   PMH: Past Medical History:  Diagnosis Date   Acid reflux    Arthritis    Asthma    Asthma    Back pain    C. difficile colitis 2011   Enteritis 03/21/2013   History of kidney stones    Hypertension    Influenza A 03/24/2013   LBBB (left bundle branch block) 03/21/2013   Mitral valve prolapse    Shingles    Small bowel obstruction ([place]) 03/24/2013   SBO vs. enteritis.    Stroke [facility])     Surgical History: Past Surgical History:  Procedure Laterality Date   ABDOMINAL HYSTERECTOMY     APPENDECTOMY     BIOPSY  02/22/2019   Procedure: BIOPSY;  Surgeon: Daneil Dolin, MD;  [facility]: [facility];  Service: Endoscopy;;   CATARACT EXTRACTION Bilateral    CHOLECYSTECTOMY     COLONOSCOPY  2010   Dr. Earley Brooke   COLONOSCOPY WITH PROPOFOL N/A 12/02/2017   Dr. Gala Romney: diverticulosis.    ESOPHAGOGASTRODUODENOSCOPY  2011   Dr. Gala Romney: patent Schatzki's ring s/p dilation, esophageal biopsies negative, duodenal biopsies negative. Intolerant to PPIs in past and took H2 blockers   ESOPHAGOGASTRODUODENOSCOPY N/A 02/22/2019   Procedure: ESOPHAGOGASTRODUODENOSCOPY (EGD);  Surgeon: Daneil Dolin, MD;  Location: [facility];  Service: Endoscopy;  Laterality: N/A;  2:15pm   EXPLORATORY LAPAROTOMY     in her 33s, removed appendix, hysterectomy, cholecystectomy, varicose veins in abdomen   JOINT REPLACEMENT     Rt knee   MALONEY DILATION N/A 02/22/2019   Procedure: MALONEY DILATION;  Surgeon: Daneil Dolin, MD;  Location: [facility];  Service: Endoscopy;  Laterality: N/A;   RENAL ARTERY STENT  2019   [place]   sinus surgeries     x 2 (11/2017; 10/2018). First at [facility] and second at [place] Left 11/08/2014   Procedure: TOTAL KNEE ARTHROPLASTY;  Surgeon: Hessie Knows, MD;  Location: [facility] ORS;  Service: Orthopedics;  Laterality: Left;   TOTAL KNEE REVISION Left 04/16/2015   Procedure: TOTAL KNEE REVISION;  Surgeon: Hessie Knows, MD;  Location: [facility] ORS;  Service: Orthopedics;  Laterality: Left;    Home Medications:  Allergies as of 08/13/2021       Reactions   Influenza Vaccines Rash   Sulfa Antibiotics Anaphylaxis   Rash all over, tongue swells per patient   Trimethobenzamide Other (See Comments), Swelling   Headaches/leg swelling Headaches/leg swelling Headaches/leg swelling   Augmentin [amoxicillin-pot Clavulanate] Other (See Comments)   Mouth sores/thrush Did it involve swelling of the face/tongue/throat, SOB, or low BP? No Did it involve sudden or severe rash/hives, skin peeling, or any reaction on the inside of your mouth or nose? No Did you need to seek medical attention at a hospital or doctor's office? No When did it last happen?      within the past 10 years If all above answers are "NO", may proceed with cephalosporin use.   Dexilant [dexlansoprazole]  Diarrhea   Abdominal cramps   Esomeprazole Magnesium Diarrhea   Abdominal cramps   Hydralazine    Iodine    Topical iodine, caused burning sensation.   Lipitor [atorvastatin] Other (See Comments)   Leg cramps   Omeprazole Diarrhea   Abdominal cramps   Pantoprazole Sodium Diarrhea   Abdominal cramps   Pepcid [famotidine] Diarrhea   Abdominal cramps    Prevacid [lansoprazole] Diarrhea, Other (See Comments)   GI Distress Abdominal cramps   Rabeprazole Sodium Diarrhea   Abdominal cramps   Spironolactone Other (See Comments)   Elevated BUN   Tagamet [cimetidine] Diarrhea   Abdominal  cramps   Terazosin Other (See Comments)   headache   Tigan [trimethobenzamide Hcl] Other (See Comments)   Headaches/leg swelling   Zithromax [azithromycin] Other (See Comments)   GI distress   Sulfonamide Derivatives Swelling, Rash   Tongue swelling        Medication List        Accurate as of Aug 13, 2021 10:40 AM. If you have any questions, ask your nurse or doctor.          acetaminophen 650 MG CR tablet Commonly known as: TYLENOL Take 1,300 mg by mouth 2 (two) times daily as needed for pain.   amitriptyline 10 MG tablet Commonly known as: ELAVIL Take 10 mg by mouth at bedtime.   amLODipine 10 MG tablet Commonly known as: NORVASC Take 10 mg by mouth at bedtime.   aspirin-acetaminophen-caffeine 250-250-65 MG tablet Commonly known as: EXCEDRIN MIGRAINE Take 1-2 tablets by mouth every 6 (six) hours as needed for headache.   cetirizine 10 MG tablet Commonly known as: ZYRTEC Take 10 mg by mouth daily as needed for allergies.   chlorthalidone 25 MG tablet Commonly known as: HYGROTON Take 25 mg by mouth daily.   cloNIDine 0.1 MG tablet Commonly known as: CATAPRES Take 0.1 mg by mouth 2 (two) times daily.   estradiol 0.1 MG/GM vaginal cream Commonly known as: ESTRACE 1 gram nightly vaginally for 2 weeks and then 2-3x weekly.   fluconazole 150 MG tablet Commonly known as: DIFLUCAN Take 1 tablet (150 mg total) by mouth daily.   fluticasone furoate-vilanterol 100-25 MCG/INH Aepb Commonly known as: BREO ELLIPTA Inhale 1 puff into the lungs daily.   Gaviscon 80-14.2 MG Chew Generic drug: Alum Hydroxide-Mag Trisilicate Chew 1-2 tablets by mouth daily as needed (indigestion).   Gaviscon 95-358 MG/15ML Susp Generic drug: aluminum hydroxide-magnesium carbonate Take 15 mLs by mouth daily as needed for indigestion.   linaclotide 145 MCG Caps capsule Commonly known as: LINZESS Take 145 mcg by mouth daily before breakfast. Taking as needed.   losartan 100 MG  tablet Commonly known as: COZAAR Take 100 mg by mouth daily.   montelukast 10 MG tablet Commonly known as: SINGULAIR Take 10 mg by mouth every evening.   multivitamin with minerals Tabs tablet Take 1 tablet by mouth daily. Centrum silver   nitrofurantoin (macrocrystal-monohydrate) 100 MG capsule Commonly known as: MACROBID Take 1 capsule (100 mg total) by mouth every 12 (twelve) hours.   nitrofurantoin 50 MG capsule Commonly known as: MACRODANTIN Take 1 capsule (50 mg total) by mouth at bedtime.   NON FORMULARY Inject 1 Dose as directed See admin instructions. Allergy shot every 4 weeks   Premarin vaginal cream Generic drug: conjugated estrogens Place 1 Applicatorful vaginally every other day.   saccharomyces boulardii 250 MG capsule Commonly known as: FLORASTOR Take 250 mg by mouth daily.   simvastatin 20 MG  tablet Commonly known as: ZOCOR Take 20 mg by mouth every evening.   spironolactone 50 MG tablet Commonly known as: ALDACTONE Take 50 mg by mouth daily.        Allergies:  Allergies  Allergen Reactions   Influenza Vaccines Rash   Sulfa Antibiotics Anaphylaxis    Rash all over, tongue swells per patient   Trimethobenzamide Other (See Comments) and Swelling    Headaches/leg swelling Headaches/leg swelling Headaches/leg swelling    Augmentin [Amoxicillin-Pot Clavulanate] Other (See Comments)    Mouth sores/thrush Did it involve swelling of the face/tongue/throat, SOB, or low BP? No Did it involve sudden or severe rash/hives, skin peeling, or any reaction on the inside of your mouth or nose? No Did you need to seek medical attention at a hospital or doctor's office? No When did it last happen?      within the past 10 years If all above answers are "NO", may proceed with cephalosporin use.     Dexilant [Dexlansoprazole] Diarrhea    Abdominal cramps   Esomeprazole Magnesium Diarrhea    Abdominal cramps   Hydralazine    Iodine     Topical iodine,  caused burning sensation.   Lipitor [Atorvastatin] Other (See Comments)    Leg cramps   Omeprazole Diarrhea    Abdominal cramps   Pantoprazole Sodium Diarrhea    Abdominal cramps    Pepcid [Famotidine] Diarrhea    Abdominal cramps    Prevacid [Lansoprazole] Diarrhea and Other (See Comments)    GI Distress Abdominal cramps    Rabeprazole Sodium Diarrhea    Abdominal cramps    Spironolactone Other (See Comments)    Elevated BUN   Tagamet [Cimetidine] Diarrhea    Abdominal cramps   Terazosin Other (See Comments)    headache   Tigan [Trimethobenzamide Hcl] Other (See Comments)    Headaches/leg swelling   Zithromax [Azithromycin] Other (See Comments)    GI distress   Sulfonamide Derivatives Swelling and Rash    Tongue swelling    Family History: Family History  Problem Relation Age of Onset   Breast cancer Mother 15   Colon cancer Neg Hx    Colon polyps Neg Hx     Social History:  reports that she has never smoked. She has never used smokeless tobacco. She reports current alcohol use. She reports that she does not use drugs.  ROS: All other review of systems were reviewed and are negative except what is noted above in HPI  Physical Exam: BP (!) 145/70   Pulse 75   Constitutional:  Alert and oriented, No acute distress. HEENT: Gay AT, moist mucus membranes.  Trachea midline, no masses. Cardiovascular: No clubbing, cyanosis, or edema. Respiratory: Normal respiratory effort, no increased work of breathing. GI: Abdomen is soft, nontender, nondistended, no abdominal masses GU: No CVA tenderness.  Lymph: No cervical or inguinal lymphadenopathy. Skin: No rashes, bruises or suspicious lesions. Neurologic: Grossly intact, no focal deficits, moving all 4 extremities. Psychiatric: Normal mood and affect.  Laboratory Data: Lab Results  Component Value Date   WBC 6.6 01/25/2019   HGB 12.1 01/25/2019   HCT 37.6 01/25/2019   MCV 88 01/25/2019   PLT 408 01/25/2019    Lab  Results  Component Value Date   CREATININE 1.20 (H) 01/25/2019    No results found for: PSA  No results found for: TESTOSTERONE  No results found for: HGBA1C  Urinalysis    Component Value Date/Time   COLORURINE YELLOW (A) 04/09/2015 [id number]  APPEARANCEUR Clear 05/14/2021 1106   LABSPEC 1.008 04/09/2015 0936   LABSPEC 1.009 07/04/2014 1329   PHURINE 6.0 04/09/2015 0936   GLUCOSEU Negative 05/14/2021 1106   GLUCOSEU Negative 07/04/2014 1329   HGBUR NEGATIVE 04/09/2015 0936   BILIRUBINUR Negative 05/14/2021 1106   BILIRUBINUR Negative 07/04/2014 1329   KETONESUR NEGATIVE 04/09/2015 0936   PROTEINUR Negative 05/14/2021 1106   PROTEINUR NEGATIVE 04/09/2015 0936   UROBILINOGEN 0.2 03/21/2013 1432   NITRITE Negative 05/14/2021 1106   NITRITE POSITIVE (A) 04/09/2015 0936   LEUKOCYTESUR 1+ (A) 05/14/2021 1106   LEUKOCYTESUR Negative 07/04/2014 1329    Lab Results  Component Value Date   LABMICR See below: 05/14/2021   WBCUA 6-10 (A) 05/14/2021   LABEPIT 0-10 05/14/2021   BACTERIA None seen 05/14/2021    Pertinent Imaging:  No results found for this or any previous visit.  No results found for this or any previous visit.  No results found for this or any previous visit.  No results found for this or any previous visit.  No results found for this or any previous visit.  No results found for this or any previous visit.  No results found for this or any previous visit.  No results found for this or any previous visit.   Assessment & Plan:    1. Urinary tract infection without hematuria, site unspecified -urine for culture -macrobid 100mg  BID for 7 days  2. Chronic cystitis with hematuria -we will start macrobid 50mg  qhs - Urinalysis, Routine w reflex microscopic   No follow-ups on file.  Nicolette Bang, MD  [facility] [place]

## 2021-08-15 LAB — URINE CULTURE: Organism ID, Bacteria: NO GROWTH

## 2021-08-19 ENCOUNTER — Encounter: Payer: Self-pay | Admitting: Urology

## 2021-08-19 NOTE — Patient Instructions (Signed)
Urinary Tract Infection, Adult A urinary tract infection (UTI) is an infection of any part of the urinary tract. The urinary tract includes: The kidneys. The ureters. The bladder. The urethra. These organs make, store, and get rid of pee (urine) in the body. What are the causes? This infection is caused by germs (bacteria) in your genital area. These germs grow and cause swelling (inflammation) of your urinary tract. What increases the risk? The following factors may make you more likely to develop this condition: Using a small, thin tube (catheter) to drain pee. Not being able to control when you pee or poop (incontinence). Being female. If you are female, these things can increase the risk: Using these methods to prevent pregnancy: A medicine that kills sperm (spermicide). A device that blocks sperm (diaphragm). Having low levels of a female hormone (estrogen). Being pregnant. You are more likely to develop this condition if: You have genes that add to your risk. You are sexually active. You take antibiotic medicines. You have trouble peeing because of: A prostate that is bigger than normal, if you are female. A blockage in the part of your body that drains pee from the bladder. A kidney stone. A nerve condition that affects your bladder. Not getting enough to drink. Not peeing often enough. You have other conditions, such as: Diabetes. A weak disease-fighting system (immune system). Sickle cell disease. Gout. Injury of the spine. What are the signs or symptoms? Symptoms of this condition include: Needing to pee right away. Peeing small amounts often. Pain or burning when peeing. Blood in the pee. Pee that smells bad or not like normal. Trouble peeing. Pee that is cloudy. Fluid coming from the vagina, if you are female. Pain in the belly or lower back. Other symptoms include: Vomiting. Not feeling hungry. Feeling mixed up (confused). This may be the first symptom in  older adults. Being tired and grouchy (irritable). A fever. Watery poop (diarrhea). How is this treated? Taking antibiotic medicine. Taking other medicines. Drinking enough water. In some cases, you may need to see a specialist. Follow these instructions at home:  Medicines Take over-the-counter and prescription medicines only as told by your doctor. If you were prescribed an antibiotic medicine, take it as told by your doctor. Do not stop taking it even if you start to feel better. General instructions Make sure you: Pee until your bladder is empty. Do not hold pee for a long time. Empty your bladder after sex. Wipe from front to back after peeing or pooping if you are a female. Use each tissue one time when you wipe. Drink enough fluid to keep your pee pale yellow. Keep all follow-up visits. Contact a doctor if: You do not get better after 1-2 days. Your symptoms go away and then come back. Get help right away if: You have very bad back pain. You have very bad pain in your lower belly. You have a fever. You have chills. You feeling like you will vomit or you vomit. Summary A urinary tract infection (UTI) is an infection of any part of the urinary tract. This condition is caused by germs in your genital area. There are many risk factors for a UTI. Treatment includes antibiotic medicines. Drink enough fluid to keep your pee pale yellow. This information is not intended to replace advice given to you by your health care provider. Make sure you discuss any questions you have with your health care provider. Document Revised: 10/13/2019 Document Reviewed: 10/13/2019 Elsevier Patient Education    2023 [employer]  

## 2022-09-01 ENCOUNTER — Other Ambulatory Visit: Payer: Self-pay

## 2022-09-01 DIAGNOSIS — N390 Urinary tract infection, site not specified: Secondary | ICD-10-CM

## 2022-09-01 MED ORDER — NITROFURANTOIN MACROCRYSTAL 50 MG PO CAPS: 50.0000 mg | ORAL_CAPSULE | Freq: Every day | ORAL | 11 refills | Status: DC

## 2022-09-21 ENCOUNTER — Ambulatory Visit (INDEPENDENT_AMBULATORY_CARE_PROVIDER_SITE_OTHER): Payer: Medicare Other | Admitting: Urology

## 2022-09-21 VITALS — BP 133/70 | HR 74 | Ht 62.5 in | Wt 154.4 lb

## 2022-09-21 DIAGNOSIS — N3946 Mixed incontinence: Secondary | ICD-10-CM

## 2022-09-21 DIAGNOSIS — N8111 Cystocele, midline: Secondary | ICD-10-CM | POA: Diagnosis not present

## 2022-09-21 DIAGNOSIS — R3915 Urgency of urination: Secondary | ICD-10-CM

## 2022-09-21 LAB — URINALYSIS, COMPLETE
Bilirubin, UA: NEGATIVE
Glucose, UA: NEGATIVE
Ketones, UA: NEGATIVE
Nitrite, UA: NEGATIVE
Protein,UA: NEGATIVE
RBC, UA: NEGATIVE
Specific Gravity, UA: <1.005 — ABNORMAL LOW (ref 1.005–1.030)
Urobilinogen, Ur: 0.2 mg/dL (ref 0.2–1.0)
pH, UA: 5.5 (ref 5.0–7.5)

## 2022-09-21 LAB — MICROSCOPIC EXAMINATION

## 2022-09-21 NOTE — Progress Notes (Signed)
09/21/2022 10:07 AM   Rhonda Murphy 12/27/1945 [id number]  Referring provider: Malen Gauze, MD [address] [place],  [place] [address]  No chief complaint on file.   HPI: Dr. Ronne Binning: Placed on Macrobid 50 mg once a day and had recommended estrogen in the past  Today Patient was referred for prolapse symptoms.  She feels vaginal bulging.  Sometimes she reduces it.  She has had a hysterectomy.  She is prone to constipation.  It is more uncomfortable with sitting and walking.  She tried estrogen cream in the past but stopped it because it was expensive and it was switched to daily Macrobid.  She sometimes leaks with coughing sneezing.  She sometimes has urge incontinence if she holds it too long.  She might wear a pad if she goes out in public when she travels.  Her main symptom is foot on the floor syndrome in the middle the night.  She has no bedwetting.  Sometimes her flow is difficult to start and it is varying in strength.  It is better with urgency in the middle of the night.  She voids every 2-3 hours and gets up 3 times a night  She said she has a stent in her right kidney and that she has a kidney stone on 1 or both sides.  When I reviewed the medical record she has a renal artery stent placed in [place].  CT scan in 2020 demonstrated tiny left renal stone versus vascular calcification  She states she was getting 5 or 6 bladder infections a year but clinically has been infection free on daily Macrodantin.   PMH: Past Medical History:  Diagnosis Date   Acid reflux    Arthritis    Asthma    Asthma    Back pain    C. difficile colitis 2011   Enteritis 03/21/2013   History of kidney stones    Hypertension    Influenza A 03/24/2013   LBBB (left bundle branch block) 03/21/2013   Mitral valve prolapse    Shingles    Small bowel obstruction (HCC) 03/24/2013   SBO vs. enteritis.    Stroke [facility])     Surgical History: Past Surgical History:   Procedure Laterality Date   ABDOMINAL HYSTERECTOMY     APPENDECTOMY     BIOPSY  02/22/2019   Procedure: BIOPSY;  Surgeon: Corbin Ade, MD;  Location: [facility];  Service: Endoscopy;;   CATARACT EXTRACTION Bilateral    CHOLECYSTECTOMY     COLONOSCOPY  2010   Dr. Samuella Cota   COLONOSCOPY WITH PROPOFOL N/A 12/02/2017   Dr. Jena Gauss: diverticulosis.    ESOPHAGOGASTRODUODENOSCOPY  2011   Dr. Jena Gauss: patent Schatzki's ring s/p dilation, esophageal biopsies negative, duodenal biopsies negative. Intolerant to PPIs in past and took H2 blockers   ESOPHAGOGASTRODUODENOSCOPY N/A 02/22/2019   Procedure: ESOPHAGOGASTRODUODENOSCOPY (EGD);  Surgeon: Corbin Ade, MD;  Location: [facility];  Service: Endoscopy;  Laterality: N/A;  2:15pm   EXPLORATORY LAPAROTOMY     in her 30s, removed appendix, hysterectomy, cholecystectomy, varicose veins in abdomen   JOINT REPLACEMENT     Rt knee   MALONEY DILATION N/A 02/22/2019   Procedure: MALONEY DILATION;  Surgeon: Corbin Ade, MD;  Location: [facility];  Service: Endoscopy;  Laterality: N/A;   RENAL ARTERY STENT  2019   [place]   sinus surgeries     x 2 (11/2017; 10/2018). First at [facility] and second at [facility]   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   Procedure: TOTAL KNEE ARTHROPLASTY;  Surgeon: Kennedy Bucker, MD;  Location: [facility] OR[facility];  Service: Orthopedics;  Laterality: Left;   TOTAL KNEE REVISION Left 04/16/2015   Procedure: TOTAL KNEE REVISION;  Surgeon: Kennedy Bucker, MD;  Location: [facility];  Service: Orthopedics;  Laterality: Left;    Home Medications:  Allergies as of 09/21/2022       Reactions   Influenza Vaccines Rash   Sulfa Antibiotics Anaphylaxis   Rash all over, tongue swells per patient   Trimethobenzamide Other (See Comments), Swelling   Headaches/leg swelling Headaches/leg swelling Headaches/leg swelling   Augmentin [amoxicillin-pot Clavulanate] Other (See Comments)   Mouth sores/thrush Did it  involve swelling of the face/tongue/throat, SOB, or low BP? No Did it involve sudden or severe rash/hives, skin peeling, or any reaction on the inside of your mouth or nose? No Did you need to seek medical attention at a hospital or doctor's office? No When did it last happen?      within the past 10 years If all above answers are "NO", may proceed with cephalosporin use.   Dexilant [dexlansoprazole] Diarrhea   Abdominal cramps   Esomeprazole Magnesium Diarrhea   Abdominal cramps   Hydralazine    Iodine    Topical iodine, caused burning sensation.   Lipitor [atorvastatin] Other (See Comments)   Leg cramps   Omeprazole Diarrhea   Abdominal cramps   Pantoprazole Sodium Diarrhea   Abdominal cramps   Pepcid [famotidine] Diarrhea   Abdominal cramps    Prevacid [lansoprazole] Diarrhea, Other (See Comments)   GI Distress Abdominal cramps   Rabeprazole Sodium Diarrhea   Abdominal cramps   Spironolactone Other (See Comments)   Elevated BUN   Tagamet [cimetidine] Diarrhea   Abdominal cramps   Terazosin Other (See Comments)   headache   Tigan [trimethobenzamide Hcl] Other (See Comments)   Headaches/leg swelling   Zithromax [azithromycin] Other (See Comments)   GI distress   Sulfonamide Derivatives Swelling, Rash   Tongue swelling        Medication List        Accurate as of September 21, 2022 10:07 AM. If you have any questions, ask your nurse or doctor.          acetaminophen 650 MG CR tablet Commonly known as: TYLENOL Take 1,300 mg by mouth 2 (two) times daily as needed for pain.   amitriptyline 10 MG tablet Commonly known as: ELAVIL Take 10 mg by mouth at bedtime.   amLODipine 10 MG tablet Commonly known as: NORVASC Take 10 mg by mouth at bedtime.   aspirin-acetaminophen-caffeine 250-250-65 MG tablet Commonly known as: EXCEDRIN MIGRAINE Take 1-2 tablets by mouth every 6 (six) hours as needed for headache.   cetirizine 10 MG tablet Commonly known as: ZYRTEC Take  10 mg by mouth daily as needed for allergies.   chlorthalidone 25 MG tablet Commonly known as: HYGROTON Take 25 mg by mouth daily.   cloNIDine 0.1 MG tablet Commonly known as: CATAPRES Take 0.1 mg by mouth 2 (two) times daily.   estradiol 0.1 MG/GM vaginal cream Commonly known as: ESTRACE 1 gram nightly vaginally for 2 weeks and then 2-3x weekly.   fluconazole 150 MG tablet Commonly known as: DIFLUCAN Take 1 tablet (150 mg total) by mouth daily.   fluticasone furoate-vilanterol 100-25 MCG/INH Aepb Commonly known as: BREO ELLIPTA Inhale 1 puff into the lungs daily.   Gaviscon 80-14.2 MG Chew Generic  drug: Alum Hydroxide-Mag Trisilicate Chew 1-2 tablets by mouth daily as needed (indigestion).   Gaviscon 95-358 MG/15ML Susp Generic drug: aluminum hydroxide-magnesium carbonate Take 15 mLs by mouth daily as needed for indigestion.   linaclotide 145 MCG Caps capsule Commonly known as: LINZESS Take 145 mcg by mouth daily before breakfast. Taking as needed.   losartan 100 MG tablet Commonly known as: COZAAR Take 100 mg by mouth daily.   montelukast 10 MG tablet Commonly known as: SINGULAIR Take 10 mg by mouth every evening.   multivitamin with minerals Tabs tablet Take 1 tablet by mouth daily. Centrum silver   nitrofurantoin (macrocrystal-monohydrate) 100 MG capsule Commonly known as: MACROBID Take 1 capsule (100 mg total) by mouth every 12 (twelve) hours.   nitrofurantoin 50 MG capsule Commonly known as: MACRODANTIN Take 1 capsule (50 mg total) by mouth at bedtime.   NON FORMULARY Inject 1 Dose as directed See admin instructions. Allergy shot every 4 weeks   Premarin vaginal cream Generic drug: conjugated estrogens Place 1 Applicatorful vaginally every other day.   saccharomyces boulardii 250 MG capsule Commonly known as: FLORASTOR Take 250 mg by mouth daily.   simvastatin 20 MG tablet Commonly known as: ZOCOR Take 20 mg by mouth every evening.    spironolactone 50 MG tablet Commonly known as: ALDACTONE Take 50 mg by mouth daily.        Allergies:  Allergies  Allergen Reactions   Influenza Vaccines Rash   Sulfa Antibiotics Anaphylaxis    Rash all over, tongue swells per patient   Trimethobenzamide Other (See Comments) and Swelling    Headaches/leg swelling Headaches/leg swelling Headaches/leg swelling    Augmentin [Amoxicillin-Pot Clavulanate] Other (See Comments)    Mouth sores/thrush Did it involve swelling of the face/tongue/throat, SOB, or low BP? No Did it involve sudden or severe rash/hives, skin peeling, or any reaction on the inside of your mouth or nose? No Did you need to seek medical attention at a hospital or doctor's office? No When did it last happen?      within the past 10 years If all above answers are "NO", may proceed with cephalosporin use.     Dexilant [Dexlansoprazole] Diarrhea    Abdominal cramps   Esomeprazole Magnesium Diarrhea    Abdominal cramps   Hydralazine    Iodine     Topical iodine, caused burning sensation.   Lipitor [Atorvastatin] Other (See Comments)    Leg cramps   Omeprazole Diarrhea    Abdominal cramps   Pantoprazole Sodium Diarrhea    Abdominal cramps    Pepcid [Famotidine] Diarrhea    Abdominal cramps    Prevacid [Lansoprazole] Diarrhea and Other (See Comments)    GI Distress Abdominal cramps    Rabeprazole Sodium Diarrhea    Abdominal cramps    Spironolactone Other (See Comments)    Elevated BUN   Tagamet [Cimetidine] Diarrhea    Abdominal cramps   Terazosin Other (See Comments)    headache   Tigan [Trimethobenzamide Hcl] Other (See Comments)    Headaches/leg swelling   Zithromax [Azithromycin] Other (See Comments)    GI distress   Sulfonamide Derivatives Swelling and Rash    Tongue swelling    Family History: Family History  Problem Relation Age of Onset   Breast cancer Mother 6   Colon cancer Neg Hx    Colon polyps Neg Hx     Social History:   reports that she has never smoked. She has never used smokeless tobacco. She reports current  alcohol use. She reports that she does not use drugs.  ROS:                                        Physical Exam: There were no vitals taken for this visit.  Constitutional:  Alert and oriented, No acute distress. HEENT: [place] AT, moist mucus membranes.  Trachea midline, no masses. Cardiovascular: No clubbing, cyanosis, or edema. Respiratory: Normal respiratory effort, no increased work of breathing. GI: Abdomen is soft, nontender, nondistended, no abdominal masses GU: Moderate grade 2 cystocele that just reach beyond the urethrovesical angle.  Modest central defect.  Vaginal cuff distended from 8 or 9 cm to approximately 6 cm but she could not perform strong Valsalva.  No rectocele.  She was a bit tender with a speculum. Skin: No rashes, bruises or suspicious lesions. Lymph: No cervical or inguinal adenopathy. Neurologic: Grossly intact, no focal deficits, moving all 4 extremities. Psychiatric: Normal mood and affect.  Laboratory Data: Lab Results  Component Value Date   WBC 6.6 01/25/2019   HGB 12.1 01/25/2019   HCT 37.6 01/25/2019   MCV 88 01/25/2019   PLT 408 01/25/2019    Lab Results  Component Value Date   CREATININE 1.20 (H) 01/25/2019    No results found for: "PSA"  No results found for: "TESTOSTERONE"  No results found for: "HGBA1C"  Urinalysis    Component Value Date/Time   COLORURINE YELLOW (A) 04/09/2015 0936   APPEARANCEUR Clear 08/13/2021 1103   LABSPEC 1.008 04/09/2015 0936   LABSPEC 1.009 07/04/2014 1329   PHURINE 6.0 04/09/2015 0936   GLUCOSEU Negative 08/13/2021 1103   GLUCOSEU Negative 07/04/2014 1329   HGBUR NEGATIVE 04/09/2015 0936   BILIRUBINUR Negative 08/13/2021 1103   BILIRUBINUR Negative 07/04/2014 1329   KETONESUR NEGATIVE 04/09/2015 0936   PROTEINUR Negative 08/13/2021 1103   PROTEINUR NEGATIVE 04/09/2015 0936    UROBILINOGEN 0.2 03/21/2013 1432   NITRITE Negative 08/13/2021 1103   NITRITE POSITIVE (A) 04/09/2015 0936   LEUKOCYTESUR 2+ (A) 08/13/2021 1103   LEUKOCYTESUR Negative 07/04/2014 1329    Pertinent Imaging: Urine reviewed.  Urine sent for culture.  Chart reviewed.  Assessment & Plan: Patient has mild mixed incontinence.  She does have some foot on the floor syndrome.  She has prolapse symptoms.  Picture was drawn.  Role of local estrogen cream discussed.  I would not be surprised if this did help with a lot of her symptoms.  Likely the best surgery for her would be a transvaginal cystocele repair and/or vault suspension.  This may be better versus a robotic procedure.  Role of urodynamics discussed.  Call if culture positive.  Stay on daily Macrodantin.  Role of pessary discussed but may need local estrogen cream to tolerate well.  Since that her symptoms are intermittent and she would rather be treated with watchful waiting.  Natural history discussed.  Follow-up with Dr. Ronne Binning for annual checkup on daily Macrodantin  There are no diagnoses linked to this encounter.  No follow-ups on file.  Martina Sinner, MD  [facility] [address] [place], [place] [address] [phone]

## 2022-09-23 LAB — CULTURE, URINE COMPREHENSIVE

## 2023-01-15 ENCOUNTER — Other Ambulatory Visit: Payer: Self-pay

## 2023-02-03 ENCOUNTER — Other Ambulatory Visit: Payer: Self-pay | Admitting: Urology

## 2023-02-03 MED ORDER — NITROFURANTOIN MACROCRYSTAL 50 MG PO CAPS: 50.0000 mg | ORAL_CAPSULE | Freq: Every day | ORAL | 3 refills | Status: DC

## 2024-02-13 ENCOUNTER — Other Ambulatory Visit: Payer: Self-pay | Admitting: Urology
